# Patient Record
Sex: Female | Born: 1955 | Race: Black or African American | Hispanic: No | Smoking: Former smoker
Health system: Southern US, Community
[De-identification: ages and names within clinical notes are randomized; demographics above are authoritative.]

## PROBLEM LIST (undated history)

## (undated) ENCOUNTER — Ambulatory Visit (HOSPITAL_COMMUNITY): Discharge status: Against Medical Advice | Payer: 59

## (undated) DIAGNOSIS — E079 Disorder of thyroid, unspecified: Secondary | ICD-10-CM

## (undated) DIAGNOSIS — T7840XA Allergy, unspecified, initial encounter: Secondary | ICD-10-CM

## (undated) DIAGNOSIS — K219 Gastro-esophageal reflux disease without esophagitis: Secondary | ICD-10-CM

## (undated) DIAGNOSIS — H5440 Blindness, one eye, unspecified eye: Secondary | ICD-10-CM

## (undated) DIAGNOSIS — M1990 Unspecified osteoarthritis, unspecified site: Secondary | ICD-10-CM

## (undated) DIAGNOSIS — I10 Essential (primary) hypertension: Secondary | ICD-10-CM

## (undated) DIAGNOSIS — M549 Dorsalgia, unspecified: Secondary | ICD-10-CM

## (undated) DIAGNOSIS — D649 Anemia, unspecified: Secondary | ICD-10-CM

## (undated) DIAGNOSIS — J80 Acute respiratory distress syndrome: Secondary | ICD-10-CM

## (undated) HISTORY — PX: OTHER SURGICAL HISTORY: SHX169

## (undated) HISTORY — DX: Allergy, unspecified, initial encounter: T78.40XA

## (undated) HISTORY — DX: Acute respiratory distress syndrome: J80

## (undated) HISTORY — DX: Unspecified osteoarthritis, unspecified site: M19.90

## (undated) HISTORY — PX: HERNIA REPAIR: SHX51

## (undated) HISTORY — DX: Blindness, one eye, unspecified eye: H54.40

## (undated) HISTORY — DX: Anemia, unspecified: D64.9

## (undated) HISTORY — DX: Gastro-esophageal reflux disease without esophagitis: K21.9

---

## 1999-05-13 ENCOUNTER — Encounter: Payer: Self-pay | Admitting: Emergency Medicine

## 1999-05-13 ENCOUNTER — Emergency Department (HOSPITAL_COMMUNITY): Admission: EM | Admit: 1999-05-13 | Discharge: 1999-05-13 | Payer: Self-pay | Admitting: Emergency Medicine

## 2001-01-25 ENCOUNTER — Emergency Department (HOSPITAL_COMMUNITY): Admission: EM | Admit: 2001-01-25 | Discharge: 2001-01-25 | Payer: Self-pay | Admitting: *Deleted

## 2001-08-28 ENCOUNTER — Emergency Department (HOSPITAL_COMMUNITY): Admission: EM | Admit: 2001-08-28 | Discharge: 2001-08-28 | Payer: Self-pay | Admitting: Emergency Medicine

## 2002-03-01 ENCOUNTER — Other Ambulatory Visit: Admission: RE | Admit: 2002-03-01 | Discharge: 2002-03-01 | Payer: Self-pay | Admitting: Obstetrics & Gynecology

## 2002-03-22 ENCOUNTER — Ambulatory Visit (HOSPITAL_COMMUNITY): Admission: RE | Admit: 2002-03-22 | Discharge: 2002-03-22 | Payer: Self-pay | Admitting: Obstetrics & Gynecology

## 2002-03-22 ENCOUNTER — Encounter: Payer: Self-pay | Admitting: Obstetrics & Gynecology

## 2004-01-16 ENCOUNTER — Encounter (HOSPITAL_COMMUNITY): Admission: RE | Admit: 2004-01-16 | Discharge: 2004-04-15 | Payer: Self-pay | Admitting: Internal Medicine

## 2004-06-29 ENCOUNTER — Ambulatory Visit (HOSPITAL_COMMUNITY): Admission: RE | Admit: 2004-06-29 | Discharge: 2004-06-29 | Payer: Self-pay | Admitting: Internal Medicine

## 2004-09-09 ENCOUNTER — Ambulatory Visit: Payer: Self-pay | Admitting: Psychiatry

## 2004-09-11 ENCOUNTER — Inpatient Hospital Stay (HOSPITAL_COMMUNITY): Admission: EM | Admit: 2004-09-11 | Discharge: 2004-09-17 | Payer: Self-pay | Admitting: Psychiatry

## 2004-09-25 ENCOUNTER — Emergency Department (HOSPITAL_COMMUNITY): Admission: EM | Admit: 2004-09-25 | Discharge: 2004-09-25 | Payer: Self-pay | Admitting: Family Medicine

## 2004-10-30 ENCOUNTER — Emergency Department (HOSPITAL_COMMUNITY): Admission: EM | Admit: 2004-10-30 | Discharge: 2004-10-30 | Payer: Self-pay | Admitting: Emergency Medicine

## 2004-11-07 ENCOUNTER — Emergency Department (HOSPITAL_COMMUNITY): Admission: EM | Admit: 2004-11-07 | Discharge: 2004-11-07 | Payer: Self-pay | Admitting: Emergency Medicine

## 2006-03-29 ENCOUNTER — Emergency Department (HOSPITAL_COMMUNITY): Admission: EM | Admit: 2006-03-29 | Discharge: 2006-03-29 | Payer: Self-pay | Admitting: Emergency Medicine

## 2008-04-27 ENCOUNTER — Inpatient Hospital Stay (HOSPITAL_COMMUNITY): Admission: RE | Admit: 2008-04-27 | Discharge: 2008-05-02 | Payer: Self-pay | Admitting: *Deleted

## 2008-04-27 ENCOUNTER — Ambulatory Visit: Payer: Self-pay | Admitting: *Deleted

## 2008-04-27 ENCOUNTER — Other Ambulatory Visit: Payer: Self-pay | Admitting: Emergency Medicine

## 2008-04-27 ENCOUNTER — Emergency Department (HOSPITAL_COMMUNITY): Admission: EM | Admit: 2008-04-27 | Discharge: 2008-04-27 | Payer: Self-pay | Admitting: Emergency Medicine

## 2008-11-21 ENCOUNTER — Emergency Department (HOSPITAL_COMMUNITY): Admission: EM | Admit: 2008-11-21 | Discharge: 2008-11-21 | Payer: Self-pay | Admitting: Emergency Medicine

## 2009-05-15 ENCOUNTER — Emergency Department (HOSPITAL_COMMUNITY): Admission: EM | Admit: 2009-05-15 | Discharge: 2009-05-15 | Payer: Self-pay | Admitting: Emergency Medicine

## 2009-05-21 ENCOUNTER — Emergency Department (HOSPITAL_COMMUNITY): Admission: EM | Admit: 2009-05-21 | Discharge: 2009-05-21 | Payer: Self-pay | Admitting: Emergency Medicine

## 2009-06-05 ENCOUNTER — Ambulatory Visit (HOSPITAL_COMMUNITY): Admission: RE | Admit: 2009-06-05 | Discharge: 2009-06-06 | Payer: Self-pay | Admitting: General Surgery

## 2009-10-01 ENCOUNTER — Ambulatory Visit (HOSPITAL_BASED_OUTPATIENT_CLINIC_OR_DEPARTMENT_OTHER): Admission: RE | Admit: 2009-10-01 | Discharge: 2009-10-01 | Payer: Self-pay | Admitting: Orthopedic Surgery

## 2010-09-14 LAB — URINALYSIS, ROUTINE W REFLEX MICROSCOPIC
Glucose, UA: NEGATIVE mg/dL
Ketones, ur: NEGATIVE mg/dL
Leukocytes, UA: NEGATIVE
Protein, ur: 30 mg/dL — AB

## 2010-09-14 LAB — BASIC METABOLIC PANEL
BUN: 11 mg/dL (ref 6–23)
Calcium: 9.9 mg/dL (ref 8.4–10.5)
Creatinine, Ser: 0.92 mg/dL (ref 0.4–1.2)
GFR calc non Af Amer: >60 mL/min (ref >60)

## 2010-09-14 LAB — URINE MICROSCOPIC-ADD ON

## 2010-09-14 LAB — CBC
HCT: 37.9 % (ref 36.0–46.0)
Platelets: 214 10*3/uL (ref 150–400)
RDW: 14.1 % (ref 11.5–15.5)

## 2010-09-14 LAB — DIFFERENTIAL
Basophils Absolute: 0 10*3/uL (ref 0.0–0.1)
Eosinophils Relative: 2 % (ref 0–5)
Lymphocytes Relative: 26 % (ref 12–46)
Neutro Abs: 4.6 10*3/uL (ref 1.7–7.7)

## 2010-09-14 LAB — PROTIME-INR: Prothrombin Time: 13.5 seconds (ref 11.6–15.2)

## 2010-09-15 LAB — DIFFERENTIAL
Basophils Relative: 1 % (ref 0–1)
Eosinophils Absolute: 0.1 10*3/uL (ref 0.0–0.7)
Lymphs Abs: 2.3 10*3/uL (ref 0.7–4.0)
Monocytes Absolute: 0.4 10*3/uL (ref 0.1–1.0)
Monocytes Relative: 5 % (ref 3–12)
Neutro Abs: 5.5 10*3/uL (ref 1.7–7.7)

## 2010-09-15 LAB — COMPREHENSIVE METABOLIC PANEL
AST: 21 U/L (ref 0–37)
Albumin: 4.7 g/dL (ref 3.5–5.2)
Alkaline Phosphatase: 76 U/L (ref 39–117)
Chloride: 103 mEq/L (ref 96–112)
Potassium: 3.8 mEq/L (ref 3.5–5.1)
Total Bilirubin: 0.6 mg/dL (ref 0.3–1.2)
Total Protein: 8.5 g/dL — ABNORMAL HIGH (ref 6.0–8.3)

## 2010-09-15 LAB — URINE MICROSCOPIC-ADD ON

## 2010-09-15 LAB — URINALYSIS, ROUTINE W REFLEX MICROSCOPIC
Leukocytes, UA: NEGATIVE
Nitrite: NEGATIVE
Specific Gravity, Urine: 1.029 (ref 1.005–1.030)
Urobilinogen, UA: 0.2 mg/dL (ref 0.0–1.0)

## 2010-09-15 LAB — CBC
Platelets: 244 10*3/uL (ref 150–400)
RDW: 14.4 % (ref 11.5–15.5)

## 2010-09-21 LAB — GC/CHLAMYDIA PROBE AMP, GENITAL

## 2010-09-21 LAB — WET PREP, GENITAL
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
## 2010-10-30 NOTE — Discharge Summary (Signed)
NAME:  Jamie Hicks, Jamie Hicks NO.:  [id number]   MEDICAL RECORD NO.:  [id number]          PATIENT TYPE:  IPS   LOCATION:                                FACILITY:  [facility]   PHYSICIAN:  Jeanice Lim, M.D. DATE OF BIRTH:  03/23/1956   DATE OF ADMISSION:  DATE OF DISCHARGE:                                 DISCHARGE SUMMARY   IDENTIFYING DATA:  This is a 55 year old single African-American female with  depression, history of possible schizophrenia, homicidal ideation toward  children's father, drinking, and crack use.   MEDICATIONS:  Aciphex and propylthiouracil, noncompliant.   No known drug allergies.   PHYSICAL EXAMINATION:  NEUROLOGIC:  Within normal limits.  __________  within normal limits.  MENTAL STATUS EXAM:  Significant for poor judgment and insight, suicidal  ideation, depressive symptoms and anxiety.   ADMISSION DIAGNOSES:   AXIS I:  1.  Depressive disorder, not otherwise specified.  2.  Polysubstance abuse.  3.  Cocaine dependence.  4.  Alcohol dependence.  5.  Rule out substance-induced mood disorder.   AXIS II:  Deferred.   AXIS III:  1.  Acid reflux.  2.  Hyperthyroidism history.  3.  Medication noncompliance.   AXIS IV:  Stressors:  Occupational problems, moderate stressors.   AXIS V:  30/60.   The patient was admitted with routine p.r.n. medications and further  monitoring.  Was encouraged to participate in individual, group and milieu  therapy with stabilizing medications, monitored for safety.  Reported a  positive response to clinical intervention and was discharged after  medication education on Symmetrel, Protonix, propylthiouracil to take as  previously directed, Lexapro 5 mg daily, Bactrim, Risperdal 0.5 mg one every  9 p.m., trazodone 50 mg every 9 p.m., and Ambien 10 mg q.h.s. p.r.n.  The  patient was to follow up with [facility] walk-in clinic Monday through Friday, 1-4.   DISCHARGE DIAGNOSES:   AXIS I:  1.  Depressive disorder, not  otherwise specified.  2.  Polysubstance abuse.  3.  Cocaine dependence.  4.  Alcohol dependence.  5.  Rule out substance-induced mood disorder.   AXIS II:  Deferred.   AXIS III:  1.  Acid reflux.  2.  Hyperthyroidism history.  3.  Medication noncompliance.   AXIS IV:  Stressors:  Occupational problems, moderate stressors.   AXIS V:  55.      JEM/MEDQ  D:  10/22/2004  T:  10/22/2004  Job:  [id number]

## 2011-01-02 ENCOUNTER — Emergency Department (HOSPITAL_COMMUNITY)
Admission: EM | Admit: 2011-01-02 | Discharge: 2011-01-02 | Disposition: A | Discharge status: Home / Self Care | Payer: Self-pay | Attending: Emergency Medicine | Admitting: Emergency Medicine

## 2011-01-02 ENCOUNTER — Emergency Department (HOSPITAL_COMMUNITY): Payer: Self-pay

## 2011-01-02 DIAGNOSIS — W19XXXA Unspecified fall, initial encounter: Secondary | ICD-10-CM | POA: Insufficient documentation

## 2011-01-02 DIAGNOSIS — S99929A Unspecified injury of unspecified foot, initial encounter: Secondary | ICD-10-CM | POA: Insufficient documentation

## 2011-01-02 DIAGNOSIS — S8990XA Unspecified injury of unspecified lower leg, initial encounter: Secondary | ICD-10-CM | POA: Insufficient documentation

## 2011-01-02 DIAGNOSIS — F17200 Nicotine dependence, unspecified, uncomplicated: Secondary | ICD-10-CM | POA: Insufficient documentation

## 2011-01-02 DIAGNOSIS — M25569 Pain in unspecified knee: Secondary | ICD-10-CM | POA: Insufficient documentation

## 2011-03-16 LAB — T3, FREE: T3, Free: 2.3 (ref 2.3–4.2)

## 2011-03-17 LAB — BASIC METABOLIC PANEL
CO2: 24
Calcium: 9.5
Creatinine, Ser: 0.98
GFR calc non Af Amer: 60 — ABNORMAL LOW
Sodium: 135

## 2011-03-17 LAB — RAPID URINE DRUG SCREEN, HOSP PERFORMED
Amphetamines: NOT DETECTED
Opiates: NOT DETECTED
Tetrahydrocannabinol: NOT DETECTED

## 2011-03-17 LAB — ETHANOL

## 2011-03-17 LAB — DIFFERENTIAL
Eosinophils Absolute: 0.1
Lymphocytes Relative: 18
Lymphs Abs: 1.8
Monocytes Relative: 4
Neutro Abs: 7.8 — ABNORMAL HIGH
Neutrophils Relative %: 77

## 2011-03-17 LAB — CBC
Platelets: 261
RBC: 4.54
WBC: 10

## 2011-03-17 LAB — TRICYCLICS SCREEN, URINE

## 2012-01-27 ENCOUNTER — Emergency Department (HOSPITAL_COMMUNITY): Payer: Medicaid Other

## 2012-01-27 ENCOUNTER — Encounter (HOSPITAL_COMMUNITY): Payer: Self-pay | Admitting: Emergency Medicine

## 2012-01-27 ENCOUNTER — Emergency Department (HOSPITAL_COMMUNITY)
Admission: EM | Admit: 2012-01-27 | Discharge: 2012-01-27 | Disposition: A | Discharge status: Home / Self Care | Payer: Medicaid Other | Attending: Emergency Medicine | Admitting: Emergency Medicine

## 2012-01-27 DIAGNOSIS — M542 Cervicalgia: Secondary | ICD-10-CM | POA: Insufficient documentation

## 2012-01-27 DIAGNOSIS — E079 Disorder of thyroid, unspecified: Secondary | ICD-10-CM | POA: Insufficient documentation

## 2012-01-27 DIAGNOSIS — M545 Low back pain, unspecified: Secondary | ICD-10-CM | POA: Insufficient documentation

## 2012-01-27 DIAGNOSIS — M25569 Pain in unspecified knee: Secondary | ICD-10-CM | POA: Insufficient documentation

## 2012-01-27 DIAGNOSIS — F17200 Nicotine dependence, unspecified, uncomplicated: Secondary | ICD-10-CM | POA: Insufficient documentation

## 2012-01-27 DIAGNOSIS — M25561 Pain in right knee: Secondary | ICD-10-CM

## 2012-01-27 HISTORY — DX: Dorsalgia, unspecified: M54.9

## 2012-01-27 HISTORY — DX: Disorder of thyroid, unspecified: E07.9

## 2012-01-27 MED ORDER — OXYCODONE-ACETAMINOPHEN 5-325 MG PO TABS
1.0000 | ORAL_TABLET | Freq: Once | ORAL | Status: AC
  Administered 2012-01-27: 1 via ORAL
  Filled 2012-01-27: qty 1

## 2012-01-27 MED ORDER — MELOXICAM 15 MG PO TABS: 7.5000 mg | ORAL_TABLET | Freq: Every day | ORAL | Status: DC

## 2012-01-27 MED ORDER — OXYCODONE-ACETAMINOPHEN 5-325 MG PO TABS: ORAL_TABLET | ORAL | Status: AC

## 2012-01-27 MED ORDER — KETOROLAC TROMETHAMINE 60 MG/2ML IM SOLN
60.0000 mg | Freq: Once | INTRAMUSCULAR | Status: AC
  Administered 2012-01-27: 60 mg via INTRAMUSCULAR
  Filled 2012-01-27: qty 2

## 2012-01-27 NOTE — ED Provider Notes (Signed)
Medical screening examination/treatment/procedure(s) were performed by non-physician practitioner and as supervising physician I was immediately available for consultation/collaboration.  Camela Wich T Harmoney Sienkiewicz, MD 01/27/12 2320 

## 2012-01-27 NOTE — ED Provider Notes (Signed)
History     CSN: [id number]  Arrival date & time 01/27/12  1346   First MD Initiated Contact with Patient 01/27/12 1530      Chief Complaint  Patient presents with  . Back Pain  . Leg Pain    (Consider location/radiation/quality/duration/timing/severity/associated sxs/prior treatment) HPI  56 y.o. female in no acute distress complaining of worsening cervical and lower back pain over the course of the last year. Denies any recent trauma. Denies any radiation pain is 8/10 not relieved with over-the-counter NSAIDs. Exacerbated by weightbearing. She is also having issues with her right knee. Pain is severe at 8/10, nonradiating, exacerbated by weightbearing. And not relieved with over-the-counter pain medication. She had arthroscopy on the left knee but does not currently follow with her orthopedist Dr. Luiz Blare because she is uninsured at the moment. She denies any fever, cancer, history of IV drug use, numbness or paresthesia, or change in her bowel or bladder habits.   Past Medical History  Diagnosis Date  . Back pain   . Thyroid disease     Past Surgical History  Procedure Date  . Hernia repair   . Knee arthorscopy     No family history on file.  History  Substance Use Topics  . Smoking status: Current Some Day Smoker    Types: Cigarettes  . Smokeless tobacco: Not on file  . Alcohol Use: Yes     occassionally    OB History    Grav Para Term Preterm Abortions TAB SAB Ect Mult Living                  Review of Systems  Musculoskeletal: Positive for back pain and joint swelling.  All other systems reviewed and are negative.    Allergies  Review of patient's allergies indicates no known allergies.  Home Medications   Current Outpatient Rx  Name Route Sig Dispense Refill  . ACETAMINOPHEN ER 650 MG PO TBCR Oral Take 650 mg by mouth every 8 (eight) hours as needed. For pain      BP 160/86  Pulse 70  Temp 98.2 F (36.8 C) (Oral)  Resp 20  SpO2  98%  Physical Exam  Nursing note and vitals reviewed. Constitutional: She is oriented to person, place, and time. She appears well-developed and well-nourished. No distress.  HENT:  Head: Normocephalic.  Eyes: Conjunctivae and EOM are normal. Pupils are equal, round, and reactive to light.  Neck: Normal range of motion.       No midline tenderness or muscle spasms. Mildly tender to paracervical musculature  Cardiovascular: Normal rate.   Pulmonary/Chest: Effort normal.  Abdominal: Soft. Bowel sounds are normal.  Musculoskeletal: Normal range of motion.       Right knee: No deformity, erythema or abrasions. FROM. Mild effusion with no ballotable knee cap she also has crepitance worse in the right than the left.. Anterior and posterior drawer show no abnormal laxity. Stable to valgus and varus stress. Joint lines are non-tender. Neurovascularly intact. Pt ambulates with mildly antalgic gait.   Diffuse tenderness to bilateral lumbar paraspinal musculature. No point tenderness or midline tenderness.   Pulses 2+ x4 extremities and sensation is intact to both soft touch and pinprick x4 extremities.  Neurological: She is alert and oriented to person, place, and time.  Psychiatric: She has a normal mood and affect.    ED Course  Procedures (including critical care time)  Labs Reviewed - No data to display Dg Cervical Spine Complete  01/27/2012  *  RADIOLOGY REPORT*  Clinical Data: Neck pain.  Remote history of fall.  CERVICAL SPINE - COMPLETE 4+ VIEW  Comparison: None.  Findings: The cervical spine is visualized through the midbody C7. There is disc space narrowing at C4-C5.  There is moderate bilateral neural foraminal narrowing, worse on the left at the C4-5 level. AP and odontoid views are unremarkable.  IMPRESSION: Spondylosis as described.  Original Report Authenticated By: Elsie Stain, M.D.     1. Lumbago without sciatica   2. Cervicalgia   3. Arthralgia of knee, right       MDM   Chronic and worsening low back and knee pain likely secondary to arthritis. I will give patient prescription for Mobic and Percocet for breakthrough pain. I will also encourage use of a neoprene compression sleeve to the right knee. Encourage patient to follow up with her orthopedist Dr. Luiz Blare. Pt verbalized understanding and agrees with care plan. Outpatient follow-up and return precautions given.           Wynetta Emery, PA-C 01/27/12 1555

## 2012-01-27 NOTE — ED Notes (Signed)
Pt presenting to ed with c/o back pain and bilateral leg pain. Pt states pain x 1 year pt states her doctor referred her for an mri but she never had it. Pt states now her pain is worse pt states she does have history of chronic back pain

## 2012-01-27 NOTE — ED Notes (Signed)
Pt states "here for some pain medication, need a doctor to tx my thyroid"; papers given to pt for primary MD resources.

## 2012-01-27 NOTE — ED Notes (Signed)
Pt verbalizes understanding 

## 2012-08-13 ENCOUNTER — Encounter (HOSPITAL_COMMUNITY): Payer: Self-pay | Admitting: *Deleted

## 2012-08-13 ENCOUNTER — Emergency Department (HOSPITAL_COMMUNITY)
Admission: EM | Admit: 2012-08-13 | Discharge: 2012-08-13 | Disposition: A | Discharge status: Home / Self Care | Payer: Medicare Other | Attending: Emergency Medicine | Admitting: Emergency Medicine

## 2012-08-13 DIAGNOSIS — Z79899 Other long term (current) drug therapy: Secondary | ICD-10-CM | POA: Diagnosis not present

## 2012-08-13 DIAGNOSIS — M545 Low back pain, unspecified: Secondary | ICD-10-CM | POA: Insufficient documentation

## 2012-08-13 DIAGNOSIS — Z862 Personal history of diseases of the blood and blood-forming organs and certain disorders involving the immune mechanism: Secondary | ICD-10-CM | POA: Insufficient documentation

## 2012-08-13 DIAGNOSIS — F17200 Nicotine dependence, unspecified, uncomplicated: Secondary | ICD-10-CM | POA: Insufficient documentation

## 2012-08-13 DIAGNOSIS — Z8639 Personal history of other endocrine, nutritional and metabolic disease: Secondary | ICD-10-CM | POA: Insufficient documentation

## 2012-08-13 DIAGNOSIS — Z791 Long term (current) use of non-steroidal anti-inflammatories (NSAID): Secondary | ICD-10-CM | POA: Diagnosis not present

## 2012-08-13 DIAGNOSIS — M2550 Pain in unspecified joint: Secondary | ICD-10-CM | POA: Diagnosis not present

## 2012-08-13 MED ORDER — HYDROCODONE-ACETAMINOPHEN 5-325 MG PO TABS: ORAL_TABLET | ORAL | Status: DC

## 2012-08-13 MED ORDER — NAPROXEN 500 MG PO TABS: 500.0000 mg | ORAL_TABLET | Freq: Two times a day (BID) | ORAL | Status: DC

## 2012-08-13 NOTE — ED Notes (Signed)
Pt states has a hx of R knee pain and lower back pain and for the past 2 months has been hurting her, states hard to get up and down stairs.

## 2012-08-13 NOTE — ED Provider Notes (Signed)
History     CSN: [id number]  Arrival date & time 08/13/12  1155   First MD Initiated Contact with Patient 08/13/12 1310      Chief Complaint  Patient presents with  . Knee Pain  . Back Pain    (Consider location/radiation/quality/duration/timing/severity/associated sxs/prior treatment) HPI Comments: Patient with h/o back pain and bilateral knee pain for > 3 years. Worse right knee and lower back pain over past 2 months. Ibuprofen with minimal relief. No red flag s/s of lower back pain. The onset of this condition was acute. The course is constant. Aggravating factors: walking. Alleviating factors: none.     Patient is a 57 y.o. female presenting with knee pain and back pain. The history is provided by the patient.  Knee Pain Associated symptoms: back pain   Associated symptoms: no neck pain   Back Pain Associated symptoms: no numbness and no weakness     Past Medical History  Diagnosis Date  . Back pain   . Thyroid disease     Past Surgical History  Procedure Laterality Date  . Hernia repair    . Knee arthorscopy      No family history on file.  History  Substance Use Topics  . Smoking status: Current Some Day Smoker    Types: Cigarettes  . Smokeless tobacco: Never Used  . Alcohol Use: Yes     Comment: occassionally    OB History   Grav Para Term Preterm Abortions TAB SAB Ect Mult Living                  Review of Systems  Constitutional: Negative for activity change.  HENT: Negative for neck pain.   Musculoskeletal: Positive for back pain and arthralgias. Negative for joint swelling.  Skin: Negative for wound.  Neurological: Negative for weakness and numbness.    Allergies  Latex  Home Medications   Current Outpatient Rx  Name  Route  Sig  Dispense  Refill  . acetaminophen (ARTHRITIS PAIN RELIEF) 650 MG CR tablet   Oral   Take 650 mg by mouth every 8 (eight) hours as needed. For pain         . Multiple Vitamin (MULTIVITAMIN WITH MINERALS)  TABS   Oral   Take 1 tablet by mouth daily.         Marland Kitchen HYDROcodone-acetaminophen (NORCO/VICODIN) 5-325 MG per tablet      Take 1-2 tablets every 6 hours as needed for severe pain   12 tablet   0   . naproxen (NAPROSYN) 500 MG tablet   Oral   Take 1 tablet (500 mg total) by mouth 2 (two) times daily.   20 tablet   0     BP 142/80  Pulse 72  Temp(Src) 98 F (36.7 C) (Oral)  Resp 18  SpO2 99%  Physical Exam  Nursing note and vitals reviewed. Constitutional: She appears well-developed and well-nourished.  HENT:  Head: Normocephalic and atraumatic.  Eyes: Pupils are equal, round, and reactive to light.  Neck: Normal range of motion. Neck supple.  Cardiovascular: Exam reveals no decreased pulses.   Musculoskeletal: She exhibits tenderness. She exhibits no edema.       Right hip: Normal.       Left hip: Normal.       Right knee: She exhibits normal range of motion, no swelling and no effusion. Tenderness found. Medial joint line and lateral joint line tenderness noted.       Right ankle: Normal.  Thoracic back: Normal.       Lumbar back: She exhibits tenderness. She exhibits normal range of motion and no bony tenderness.  Neurological: She is alert. No sensory deficit.  Motor, sensation, and vascular distal to the injury is fully intact.   Skin: Skin is warm and dry.  Psychiatric: She has a normal mood and affect.    ED Course  Procedures (including critical care time)  Labs Reviewed - No data to display No results found.   1. Knee pain, right   2. Low back pain     1:18 PM Patient seen and examined. Pt driving home.    Vital signs reviewed and are as follows: Filed Vitals:   08/13/12 1214  BP: 142/80  Pulse: 72  Temp: 98 F (36.7 C)  Resp: 18   1:18 PM Patient counseled on use of narcotic pain medications. Counseled not to combine these medications with others containing tylenol. Urged not to drink alcohol, drive, or perform any other activities  that requires focus while taking these medications. The patient verbalizes understanding and agrees with the plan.  1:18 PM Patient was counseled on RICE protocol and told to rest injury, use ice for no longer than 15 minutes every hour, compress the area, and elevate above the level of their heart as much as possible to reduce swelling.  Questions answered.  Patient verbalized understanding.       MDM  Knee pain, chronic low back pain. No concern for septic arthritis or blood clot. No compartment syndrome. Conservative management indicated.         [place], [place] 08/15/12 [id number]

## 2012-08-15 NOTE — ED Provider Notes (Signed)
Medical screening examination/treatment/procedure(s) were performed by non-physician practitioner and as supervising physician I was immediately available for consultation/collaboration.  John M Bednar, MD 08/15/12 1329 

## 2012-09-06 DIAGNOSIS — Z79899 Other long term (current) drug therapy: Secondary | ICD-10-CM | POA: Diagnosis not present

## 2012-09-22 ENCOUNTER — Encounter (HOSPITAL_COMMUNITY): Payer: Self-pay

## 2012-09-22 ENCOUNTER — Emergency Department (INDEPENDENT_AMBULATORY_CARE_PROVIDER_SITE_OTHER)
Admission: EM | Admit: 2012-09-22 | Discharge: 2012-09-22 | Disposition: A | Discharge status: Home / Self Care | Payer: Medicare Other | Source: Home / Self Care | Attending: Family Medicine | Admitting: Family Medicine

## 2012-09-22 DIAGNOSIS — M25562 Pain in left knee: Secondary | ICD-10-CM | POA: Diagnosis present

## 2012-09-22 DIAGNOSIS — M25569 Pain in unspecified knee: Secondary | ICD-10-CM

## 2012-09-22 DIAGNOSIS — I10 Essential (primary) hypertension: Secondary | ICD-10-CM | POA: Diagnosis present

## 2012-09-22 DIAGNOSIS — F17200 Nicotine dependence, unspecified, uncomplicated: Secondary | ICD-10-CM | POA: Diagnosis present

## 2012-09-22 DIAGNOSIS — G8929 Other chronic pain: Secondary | ICD-10-CM

## 2012-09-22 DIAGNOSIS — E039 Hypothyroidism, unspecified: Secondary | ICD-10-CM | POA: Diagnosis present

## 2012-09-22 LAB — COMPREHENSIVE METABOLIC PANEL
ALT: 17 U/L (ref 0–35)
AST: 21 U/L (ref 0–37)
Alkaline Phosphatase: 71 U/L (ref 39–117)
CO2: 28 mEq/L (ref 19–32)
Chloride: 103 mEq/L (ref 96–112)
GFR calc Af Amer: 77 mL/min — ABNORMAL LOW (ref >90)
GFR calc non Af Amer: 66 mL/min — ABNORMAL LOW (ref >90)
Glucose, Bld: 101 mg/dL — ABNORMAL HIGH (ref 70–99)
Potassium: 4.2 mEq/L (ref 3.5–5.1)
Total Bilirubin: 0.3 mg/dL (ref 0.3–1.2)

## 2012-09-22 LAB — HEMOGLOBIN A1C
Hgb A1c MFr Bld: 5.9 % — ABNORMAL HIGH (ref <5.7)
Mean Plasma Glucose: 123 mg/dL — ABNORMAL HIGH (ref <117)

## 2012-09-22 LAB — TSH: TSH: 39.002 u[IU]/mL — ABNORMAL HIGH (ref 0.350–4.500)

## 2012-09-22 LAB — CBC
HCT: 36.3 % (ref 36.0–46.0)
MCH: 27.1 pg (ref 26.0–34.0)
MCHC: 33.3 g/dL (ref 30.0–36.0)
MCV: 81.4 fL (ref 78.0–100.0)
RDW: 14.6 % (ref 11.5–15.5)

## 2012-09-22 LAB — LIPID PANEL: LDL Cholesterol: 144 mg/dL — ABNORMAL HIGH (ref 0–99)

## 2012-09-22 LAB — T4, FREE: Free T4: 0.42 ng/dL — ABNORMAL LOW (ref 0.80–1.80)

## 2012-09-22 MED ORDER — TRAMADOL HCL 50 MG PO TABS: 50.0000 mg | ORAL_TABLET | Freq: Three times a day (TID) | ORAL | Status: DC | PRN

## 2012-09-22 NOTE — ED Notes (Signed)
Patient states has chronic pain in both knees-more in the right Back pain for months Used to see DR Jodi Geralds

## 2012-09-22 NOTE — ED Provider Notes (Signed)
History     CSN: [id number]  Arrival date & time 09/22/12  1218   First MD Initiated Contact with Patient 09/22/12 1313      Chief Complaint  Patient presents with  . Back Pain    HPI  Pt reports that she has not had a PCP in a very long time.  Pt says that she has been going to an orthopedist Dr. Luiz Blare and reports that she hasn't been able to see him because of not having medical insurance.  Pt is reporting that she isn't taking any meds.    Past Medical History  Diagnosis Date  . Back pain   . Thyroid disease        Hypothyroidism      Plantar fasciitis  Past Surgical History  Procedure Laterality Date  . Hernia repair    . Knee arthorscopy      Family History  hypertension  History  Substance Use Topics  . Smoking status: Current Some Day Smoker    Types: Cigarettes  . Smokeless tobacco: Never Used  . Alcohol Use: Yes     Comment: occassionally    OB History   Grav Para Term Preterm Abortions TAB SAB Ect Mult Living                  Review of Systems Constitutional: Negative.  HENT: Negative.  Respiratory: Negative.  Cardiovascular: Negative.  Gastrointestinal: Negative.  Endocrine: Negative.  Genitourinary: Negative.  Musculoskeletal: chronic knee and back pain   Skin: Negative.  Allergic/Immunologic: Negative.  Neurological: Negative.  Hematological: Negative.  Psychiatric/Behavioral: Negative.  All other systems reviewed and are negative   Allergies  Latex  Home Medications   Current Outpatient Rx  Name  Route  Sig  Dispense  Refill  . acetaminophen (ARTHRITIS PAIN RELIEF) 650 MG CR tablet   Oral   Take 650 mg by mouth every 8 (eight) hours as needed. For pain         . HYDROcodone-acetaminophen (NORCO/VICODIN) 5-325 MG per tablet      Take 1-2 tablets every 6 hours as needed for severe pain   12 tablet   0   . Multiple Vitamin (MULTIVITAMIN WITH MINERALS) TABS   Oral   Take 1 tablet by mouth daily.         . naproxen  (NAPROSYN) 500 MG tablet   Oral   Take 1 tablet (500 mg total) by mouth 2 (two) times daily.   20 tablet   0     BP 149/79  Pulse 80  Temp(Src) 97.6 F (36.4 C) (Oral)  Resp 18  SpO2 100%  Physical Exam Nursing note and vitals reviewed.  Constitutional: She is oriented to person, place, and time. She appears well-developed and well-nourished. No distress.  HENT:  Head: Normocephalic and atraumatic.  Eyes: Conjunctivae and EOM are normal. Pupils are equal, round, and reactive to light.  Neck: Normal range of motion. Neck supple. No JVD present. No tracheal deviation present. No thyromegaly present.  Cardiovascular: Normal rate, regular rhythm and normal heart sounds.  Pulmonary/Chest: Effort normal and breath sounds normal. No respiratory distress. She has no wheezes.  Abdominal: Soft. Bowel sounds are normal.  Musculoskeletal: Normal range of motion. She exhibits no edema and no tenderness.  Lymphadenopathy:  She has no cervical adenopathy.  Neurological: She is alert and oriented to person, place, and time. She has normal reflexes.  Skin: Skin is warm and dry.  Psychiatric: She has a normal mood  and affect. Her behavior is normal. Judgment and thought content normal.    ED Course  Procedures (including critical care time)  Labs Reviewed - No data to display No results found.   No diagnosis found.  MDM  IMPRESSION  Hypothyroidism  Elevated BP  Chronic knee pain   Plantar fasciitis  Tobacco  Health maintenance  RECOMMENDATIONS / PLAN Check labs  Send for screening colonoscopy Send for appointment with Dr Margaretmary Bayley Send for appointment with sports medicine center for evaluation of knee problems   The patient was counseled on the dangers of tobacco use, and was advised to quit.  Reviewed strategies to maximize success, including removing cigarettes and smoking materials from environment and stress management.  FOLLOW UP  3 months for recheck   The  patient was given clear instructions to go to ER or return to medical center if symptoms don't improve, worsen or new problems develop.  The patient verbalized understanding.  The patient was told to call to get lab results if they haven't heard anything in the next week.             Cleora Fleet, MD 09/22/12 1529

## 2012-09-23 LAB — VITAMIN D 25 HYDROXY (VIT D DEFICIENCY, FRACTURES): Vit D, 25-Hydroxy: 24 ng/mL — ABNORMAL LOW (ref 30–89)

## 2012-09-24 ENCOUNTER — Encounter: Payer: Self-pay | Admitting: Family Medicine

## 2012-09-24 DIAGNOSIS — R7303 Prediabetes: Secondary | ICD-10-CM | POA: Insufficient documentation

## 2012-09-24 DIAGNOSIS — E785 Hyperlipidemia, unspecified: Secondary | ICD-10-CM | POA: Insufficient documentation

## 2012-09-24 DIAGNOSIS — E559 Vitamin D deficiency, unspecified: Secondary | ICD-10-CM | POA: Insufficient documentation

## 2012-09-24 DIAGNOSIS — M1990 Unspecified osteoarthritis, unspecified site: Secondary | ICD-10-CM | POA: Insufficient documentation

## 2012-09-24 NOTE — Progress Notes (Signed)
Quick Note:  Please inform patient that her TSH came back really high and her T4 came back low suggesting that she is not taking enough thyroid hormone. Please ask patient why she isn't taking her thyroid medicine anymore? Please call in new prescription of levothyroxine 50 mcg, take 1 po daily, #30, RF x 2. Follow up with Dr. Margaretmary Bayley (endocrinologist) as we had discussed in the office.   Please inform patient that her labs suggest that she has prediabetes. Recommend exercise program 5x per week and reduce sugar intake. Her vitamin D level came back low. Recommend that she take over the counter Vitamin D 1 cap po daily. Also her cholesterol levels were elevated. Recommend low fat low cholesterol diet and exercise as above. Labs should be checked again in 2 - 3 months.   Rodney Langton, MD, CDE, FAAFP [facility] [place], [place]   ______

## 2012-09-25 ENCOUNTER — Telehealth (HOSPITAL_COMMUNITY): Payer: Self-pay

## 2012-09-27 DIAGNOSIS — Z79899 Other long term (current) drug therapy: Secondary | ICD-10-CM | POA: Diagnosis not present

## 2012-09-29 ENCOUNTER — Telehealth (HOSPITAL_COMMUNITY): Payer: Self-pay

## 2012-09-29 NOTE — ED Notes (Signed)
Prescription for levothyroxine called into [facility] on [address]

## 2012-10-02 ENCOUNTER — Telehealth (HOSPITAL_COMMUNITY): Payer: Self-pay | Admitting: *Deleted

## 2012-10-02 NOTE — ED Notes (Addendum)
Call patient's home phone number to give lab report. Received message that number had been disconnected. Will try mobil number. Left message on mobil number to call back. I am not sure if it is the correct number for patient.

## 2012-10-20 DIAGNOSIS — F3112 Bipolar disorder, current episode manic without psychotic features, moderate: Secondary | ICD-10-CM | POA: Diagnosis not present

## 2012-12-05 DIAGNOSIS — Z79899 Other long term (current) drug therapy: Secondary | ICD-10-CM | POA: Diagnosis not present

## 2013-04-03 ENCOUNTER — Emergency Department (INDEPENDENT_AMBULATORY_CARE_PROVIDER_SITE_OTHER)
Admission: EM | Admit: 2013-04-03 | Discharge: 2013-04-03 | Disposition: A | Discharge status: Home / Self Care | Payer: Medicare Other | Source: Home / Self Care | Attending: Family Medicine | Admitting: Family Medicine

## 2013-04-03 ENCOUNTER — Encounter (HOSPITAL_COMMUNITY): Payer: Self-pay | Admitting: Emergency Medicine

## 2013-04-03 DIAGNOSIS — M549 Dorsalgia, unspecified: Secondary | ICD-10-CM | POA: Diagnosis not present

## 2013-04-03 DIAGNOSIS — M25561 Pain in right knee: Secondary | ICD-10-CM

## 2013-04-03 DIAGNOSIS — M25569 Pain in unspecified knee: Secondary | ICD-10-CM | POA: Diagnosis not present

## 2013-04-03 MED ORDER — MELOXICAM 7.5 MG PO TABS: 7.5000 mg | ORAL_TABLET | Freq: Every day | ORAL | Status: DC

## 2013-04-03 MED ORDER — HYDROCODONE-ACETAMINOPHEN 5-325 MG PO TABS: 2.0000 | ORAL_TABLET | ORAL | Status: DC | PRN

## 2013-04-03 NOTE — ED Notes (Signed)
PT   HAS  MULTIPLE   COMPLAINTS  HE  REPORTS  BODY  ACHES        /  COUGH  / BACK PAIN       SHE  WANTS  HER  THYRIOD  CHECKED  AS  WELL        PT  REPORTS     PAIN IS  CHRONIC

## 2013-04-03 NOTE — ED Provider Notes (Signed)
CSN: [id number]     Arrival date & time 04/03/13  4098 History   First MD Initiated Contact with Patient 04/03/13 1012     Chief Complaint  Patient presents with  . Cough   (Consider location/radiation/quality/duration/timing/severity/associated sxs/prior Treatment) Patient is a 57 y.o. female presenting with back pain. The history is provided by the patient. No language interpreter was used.  Back Pain Location:  Lumbar spine Quality:  Aching Radiates to:  Does not radiate Pain severity:  Moderate Pain is:  Same all the time Timing:  Constant Progression:  Worsening Chronicity:  New Relieved by:  Nothing Worsened by:  Nothing tried   Past Medical History  Diagnosis Date  . Back pain   . Thyroid disease    Past Surgical History  Procedure Laterality Date  . Hernia repair    . Knee arthorscopy     History reviewed. No pertinent family history. History  Substance Use Topics  . Smoking status: Current Some Day Smoker    Types: Cigarettes  . Smokeless tobacco: Never Used  . Alcohol Use: Yes     Comment: occassionally   OB History   Grav Para Term Preterm Abortions TAB SAB Ect Mult Living                 Review of Systems  Musculoskeletal: Positive for back pain.  All other systems reviewed and are negative.    Allergies  Latex  Home Medications   Current Outpatient Rx  Name  Route  Sig  Dispense  Refill  . THYROID PO   Oral   Take by mouth.         Marland Kitchen HYDROcodone-acetaminophen (NORCO/VICODIN) 5-325 MG per tablet   Oral   Take 2 tablets by mouth every 4 (four) hours as needed for pain.   10 tablet   0   . meloxicam (MOBIC) 7.5 MG tablet   Oral   Take 1 tablet (7.5 mg total) by mouth daily.   30 tablet   0   . traMADol (ULTRAM) 50 MG tablet   Oral   Take 1 tablet (50 mg total) by mouth every 8 (eight) hours as needed for pain.   30 tablet   0    There were no vitals taken for this visit. Physical Exam  Nursing note and vitals  reviewed. Constitutional: She is oriented to person, place, and time. She appears well-developed and well-nourished.  HENT:  Head: Normocephalic.  Right Ear: External ear normal.  Left Ear: External ear normal.  Nose: Nose normal.  Mouth/Throat: Oropharynx is clear and moist.  Eyes: Conjunctivae and EOM are normal. Pupils are equal, round, and reactive to light.  Neck: Normal range of motion. Neck supple.  Cardiovascular: Normal rate.   Pulmonary/Chest: Effort normal.  Abdominal: Soft.  Musculoskeletal: Normal range of motion.  Neurological: She is alert and oriented to person, place, and time. She has normal reflexes.  Skin: Skin is warm.  Psychiatric: She has a normal mood and affect.    ED Course  Procedures (including critical care time) Labs Review Labs Reviewed - No data to display Imaging Review No results found.  EKG Interpretation     Ventricular Rate:    PR Interval:    QRS Duration:   QT Interval:    QTC Calculation:   R Axis:     Text Interpretation:              MDM   1. Knee pain, bilateral  2. Back pain    Pt advised to call wellness clinic to be seen.  Pt referred to Dr. Luiz Blare to be seen    Elson Areas, PA-C 04/03/13 1047

## 2013-04-05 NOTE — ED Provider Notes (Signed)
Medical screening examination/treatment/procedure(s) were performed by a resident physician or non-physician practitioner and as the supervising physician I was immediately available for consultation/collaboration.  Clementeen Graham, MD    Rodolph Bong, MD 04/05/13 [phone]

## 2013-04-09 ENCOUNTER — Emergency Department (INDEPENDENT_AMBULATORY_CARE_PROVIDER_SITE_OTHER)
Admission: EM | Admit: 2013-04-09 | Discharge: 2013-04-09 | Disposition: A | Discharge status: Home / Self Care | Payer: Medicare Other | Source: Home / Self Care | Attending: Emergency Medicine | Admitting: Emergency Medicine

## 2013-04-09 ENCOUNTER — Encounter (HOSPITAL_COMMUNITY): Payer: Self-pay | Admitting: Emergency Medicine

## 2013-04-09 DIAGNOSIS — J040 Acute laryngitis: Secondary | ICD-10-CM

## 2013-04-09 MED ORDER — BENZONATATE 200 MG PO CAPS: 200.0000 mg | ORAL_CAPSULE | Freq: Three times a day (TID) | ORAL | Status: DC | PRN

## 2013-04-09 MED ORDER — PREDNISONE 20 MG PO TABS: 20.0000 mg | ORAL_TABLET | Freq: Two times a day (BID) | ORAL | Status: DC

## 2013-04-09 NOTE — ED Provider Notes (Signed)
Chief Complaint:   Chief Complaint  Patient presents with  . Sore Throat    History of Present Illness:   Jamie Hicks is a 57 year old female who has had a three-day history of sore throat, hoarseness, sweats, nasal congestion, ear congestion, cough productive of clear to white sputum, and chest pain. She denies fever, chills, aches, headache, eye problems, chest tightness, wheezing, or GI problems. She has had no known sick exposures.  Review of Systems:  Other than noted above, the patient denies any of the following symptoms: Systemic:  No fevers, chills, sweats, weight loss or gain, fatigue, or tiredness. Eye:  No redness or discharge. ENT:  No ear pain, drainage, headache, nasal congestion, drainage, sinus pressure, difficulty swallowing, or sore throat. Neck:  No neck pain or swollen glands. Lungs:  No cough, sputum production, hemoptysis, wheezing, chest tightness, shortness of breath or chest pain. GI:  No abdominal pain, nausea, vomiting or diarrhea.  PMFSH:  Past medical history, family history, social history, meds, and allergies were reviewed. She has no medication allergies. She takes level thyroxine and Vicodin for back and knee problems and hypothyroidism.  Physical Exam:   Vital signs:  BP 171/103  Pulse 65  Temp(Src) 98.6 F (37 C) (Oral)  Resp 20  SpO2 99% General:  Alert and oriented.  In no distress.  Skin warm and dry. Eye:  No conjunctival injection or drainage. Lids were normal. ENT:  TMs and canals were normal, without erythema or inflammation.  Nasal mucosa was clear and uncongested, without drainage.  Mucous membranes were moist.  Pharynx was clear with no exudate or drainage.  There were no oral ulcerations or lesions. Neck:  Supple, no adenopathy, tenderness or mass. Lungs:  No respiratory distress.  Lungs were clear to auscultation, without wheezes, rales or rhonchi.  Breath sounds were clear and equal bilaterally.  Heart:  Regular rhythm, without  gallops, murmers or rubs. Skin:  Clear, warm, and dry, without rash or lesions.  Labs:   Results for orders placed during the hospital encounter of 04/09/13  POCT RAPID STREP A ([facility] CARE ONLY)      Result Value Range   Streptococcus, Group A Screen (Direct) NEGATIVE  NEGATIVE    Assessment:  The encounter diagnosis was Laryngitis.  No indication for antibiotics.  Plan:   1.  Meds:  The following meds were prescribed:   New Prescriptions   BENZONATATE (TESSALON) 200 MG CAPSULE    Take 1 capsule (200 mg total) by mouth 3 (three) times daily as needed for cough.   PREDNISONE (DELTASONE) 20 MG TABLET    Take 1 tablet (20 mg total) by mouth 2 (two) times daily.    2.  Patient Education/Counseling:  The patient was given appropriate handouts, self care instructions, and instructed in symptomatic relief.    3.  Follow up:  The patient was told to follow up if no better in 7 days, if becoming worse in any way, and given some red flag symptoms such as fever or difficulty breathing which would prompt immediate return.  Follow up here if necessary.      Reuben Likes, MD 04/09/13 [phone]

## 2013-04-09 NOTE — ED Notes (Signed)
Patient not in treatment room 

## 2013-04-09 NOTE — ED Notes (Signed)
Sore throat for 3 days, treating self with cough drops

## 2013-04-11 LAB — CULTURE, GROUP A STREP

## 2013-04-11 NOTE — ED Notes (Signed)
Results of group A culture negative

## 2013-08-01 DIAGNOSIS — Z5181 Encounter for therapeutic drug level monitoring: Secondary | ICD-10-CM | POA: Diagnosis not present

## 2013-08-10 DIAGNOSIS — Z5181 Encounter for therapeutic drug level monitoring: Secondary | ICD-10-CM | POA: Diagnosis not present

## 2013-08-14 DIAGNOSIS — Z5181 Encounter for therapeutic drug level monitoring: Secondary | ICD-10-CM | POA: Diagnosis not present

## 2013-09-12 ENCOUNTER — Encounter (HOSPITAL_COMMUNITY): Payer: Self-pay | Admitting: Emergency Medicine

## 2013-09-12 ENCOUNTER — Emergency Department (INDEPENDENT_AMBULATORY_CARE_PROVIDER_SITE_OTHER)
Admission: EM | Admit: 2013-09-12 | Discharge: 2013-09-12 | Disposition: A | Discharge status: Home / Self Care | Payer: Medicare Other | Source: Home / Self Care | Attending: Family Medicine | Admitting: Family Medicine

## 2013-09-12 DIAGNOSIS — E663 Overweight: Secondary | ICD-10-CM | POA: Diagnosis not present

## 2013-09-12 DIAGNOSIS — J309 Allergic rhinitis, unspecified: Secondary | ICD-10-CM

## 2013-09-12 DIAGNOSIS — I10 Essential (primary) hypertension: Secondary | ICD-10-CM

## 2013-09-12 MED ORDER — IPRATROPIUM BROMIDE 0.06 % NA SOLN: 2.0000 | Freq: Four times a day (QID) | NASAL | Status: DC

## 2013-09-12 MED ORDER — AMLODIPINE BESYLATE 5 MG PO TABS: 5.0000 mg | ORAL_TABLET | Freq: Every day | ORAL | Status: DC

## 2013-09-12 MED ORDER — LORATADINE 10 MG PO TABS: 10.0000 mg | ORAL_TABLET | Freq: Every day | ORAL | Status: DC

## 2013-09-12 NOTE — ED Provider Notes (Signed)
CSN: [id number]     Arrival date & time 09/12/13  7829 History   First MD Initiated Contact with Patient 09/12/13 1031     Chief Complaint  Patient presents with  . URI   (Consider location/radiation/quality/duration/timing/severity/associated sxs/prior Treatment) HPI Comments: Patient presents with 3 days of nasal congestion, sneezing, clear rhinorrhea, watery eyes, ear fullness, occasional dizziness. States nasal congestion make it difficult to breath, but she is not short of breath.  Also states she needs to locate a new PCP so that she may return to taking her levothyroxine and "catch up" with regard to her routine health maintenance.   The history is provided by the patient.    Past Medical History  Diagnosis Date  . Back pain   . Thyroid disease    Past Surgical History  Procedure Laterality Date  . Hernia repair    . Knee arthorscopy     History reviewed. No pertinent family history. History  Substance Use Topics  . Smoking status: Current Some Day Smoker    Types: Cigarettes  . Smokeless tobacco: Never Used  . Alcohol Use: Yes     Comment: occassionally   OB History   Grav Para Term Preterm Abortions TAB SAB Ect Mult Living                 Review of Systems  Constitutional: Negative.   HENT: Positive for congestion, postnasal drip, rhinorrhea, sinus pressure and sneezing. Negative for sore throat.   Eyes: Positive for discharge. Negative for photophobia, pain, redness and visual disturbance.       Watery eyes  Respiratory: Positive for cough. Negative for choking, chest tightness, shortness of breath, wheezing and stridor.   Cardiovascular: Negative.   Gastrointestinal: Negative.   Genitourinary: Negative.   Musculoskeletal: Negative.   Skin: Negative.   Neurological: Positive for dizziness. Negative for tremors, seizures, syncope, speech difficulty, weakness, light-headedness, numbness and headaches.  Psychiatric/Behavioral: Negative.     Allergies   Latex  Home Medications   Current Outpatient Rx  Name  Route  Sig  Dispense  Refill  . amLODipine (NORVASC) 5 MG tablet   Oral   Take 1 tablet (5 mg total) by mouth daily.   30 tablet   1   . benzonatate (TESSALON) 200 MG capsule   Oral   Take 1 capsule (200 mg total) by mouth 3 (three) times daily as needed for cough.   30 capsule   0   . HYDROcodone-acetaminophen (NORCO/VICODIN) 5-325 MG per tablet   Oral   Take 2 tablets by mouth every 4 (four) hours as needed for pain.   10 tablet   0   . ipratropium (ATROVENT) 0.06 % nasal spray   Each Nare   Place 2 sprays into both nostrils 4 (four) times daily.   15 mL   1   . loratadine (CLARITIN) 10 MG tablet   Oral   Take 1 tablet (10 mg total) by mouth daily.   30 tablet   1   . meloxicam (MOBIC) 7.5 MG tablet   Oral   Take 1 tablet (7.5 mg total) by mouth daily.   30 tablet   0   . predniSONE (DELTASONE) 20 MG tablet   Oral   Take 1 tablet (20 mg total) by mouth 2 (two) times daily.   10 tablet   0   . THYROID PO   Oral   Take by mouth.         Marland Kitchen  traMADol (ULTRAM) 50 MG tablet   Oral   Take 1 tablet (50 mg total) by mouth every 8 (eight) hours as needed for pain.   30 tablet   0    BP 167/92  Pulse 62  Temp(Src) 97.6 F (36.4 C) (Oral)  Resp 12  SpO2 100% Physical Exam  Nursing note and vitals reviewed. Constitutional: She is oriented to person, place, and time. She appears well-developed and well-nourished.  HENT:  Head: Normocephalic and atraumatic.  Right Ear: Hearing, tympanic membrane, external ear and ear canal normal.  Left Ear: Hearing, tympanic membrane, external ear and ear canal normal.  Nose: Mucosal edema and rhinorrhea present.  Mouth/Throat: Uvula is midline, oropharynx is clear and moist and mucous membranes are normal. No oral lesions. No trismus in the jaw.  Eyes: Conjunctivae are normal. Right eye exhibits no discharge. Left eye exhibits no discharge. No scleral icterus.   Neck: Normal range of motion. Neck supple. No thyromegaly present.  Cardiovascular: Normal rate, regular rhythm and normal heart sounds.   Pulmonary/Chest: Effort normal and breath sounds normal. No respiratory distress. She has no wheezes.  Musculoskeletal: Normal range of motion.  Lymphadenopathy:    She has no cervical adenopathy.  Neurological: She is alert and oriented to person, place, and time. No cranial nerve deficit. She exhibits normal muscle tone. Coordination normal.  Skin: Skin is warm and dry.  Psychiatric: She has a normal mood and affect. Her behavior is normal.    ED Course  Procedures (including critical care time) Labs Review Labs Reviewed - No data to display Imaging Review No results found.   MDM   1. Allergic rhinitis   2. Hypertension   3. Overweight   Atrovent nasal spray and loratadine for allergic rhinitis. Begin Norvasc for blood pressure. Provided contact information for Dr. Carlean Jews. Dewey's office so that she may establish for primary care.   [place], [place] 09/12/13 1121

## 2013-09-12 NOTE — Discharge Instructions (Signed)
Please begin using medications as prescribed for your nasal congestion and high blood pressure and contact Dr. Ival Bible office so that you may establish with her office for primary care.   Allergic Rhinitis Allergic rhinitis is when the mucous membranes in the nose respond to allergens. Allergens are particles in the air that cause your body to have an allergic reaction. This causes you to release allergic antibodies. Through a chain of events, these eventually cause you to release histamine into the blood stream. Although meant to protect the body, it is this release of histamine that causes your discomfort, such as frequent sneezing, congestion, and an itchy, runny nose.  CAUSES  Seasonal allergic rhinitis (hay fever) is caused by pollen allergens that may come from grasses, trees, and weeds. Year-round allergic rhinitis (perennial allergic rhinitis) is caused by allergens such as house dust mites, pet dander, and mold spores.  SYMPTOMS   Nasal stuffiness (congestion).  Itchy, runny nose with sneezing and tearing of the eyes. DIAGNOSIS  Your health care provider can help you determine the allergen or allergens that trigger your symptoms. If you and your health care provider are unable to determine the allergen, skin or blood testing may be used. TREATMENT  Allergic Rhinitis does not have a cure, but it can be controlled by:  Medicines and allergy shots (immunotherapy).  Avoiding the allergen. Hay fever may often be treated with antihistamines in pill or nasal spray forms. Antihistamines block the effects of histamine. There are over-the-counter medicines that may help with nasal congestion and swelling around the eyes. Check with your health care provider before taking or giving this medicine.  If avoiding the allergen or the medicine prescribed do not work, there are many new medicines your health care provider can prescribe. Stronger medicine may be used if initial measures are ineffective.  Desensitizing injections can be used if medicine and avoidance does not work. Desensitization is when a patient is given ongoing shots until the body becomes less sensitive to the allergen. Make sure you follow up with your health care provider if problems continue. HOME CARE INSTRUCTIONS It is not possible to completely avoid allergens, but you can reduce your symptoms by taking steps to limit your exposure to them. It helps to know exactly what you are allergic to so that you can avoid your specific triggers. SEEK MEDICAL CARE IF:   You have a fever.  You develop a cough that does not stop easily (persistent).  You have shortness of breath.  You start wheezing.  Symptoms interfere with normal daily activities. Document Released: 02/23/2001 Document Revised: 03/21/2013 Document Reviewed: 02/05/2013 [facility] Patient Information 2014 [place].

## 2013-09-12 NOTE — ED Notes (Signed)
Pt has multiple  Complaints   She  Reports  Symptoms  Of   congested  Cough  And stuffy  Nose         With  Some  dizzyness  As  Well  And  Knee  Pain  -    Pt  Also reports  Symptoms    Of being  Out  Of her  thyriod  meds  And  Non  Compliance  With  followup

## 2013-09-13 NOTE — ED Provider Notes (Signed)
Medical screening examination/treatment/procedure(s) were performed by a resident physician or non-physician practitioner and as the supervising physician I was immediately available for consultation/collaboration.  Kashawn Manzano, MD     Beldon Nowling S Maisyn Nouri, MD 09/13/13 1640 

## 2013-10-10 DIAGNOSIS — H2510 Age-related nuclear cataract, unspecified eye: Secondary | ICD-10-CM | POA: Diagnosis not present

## 2013-10-10 DIAGNOSIS — H2513 Age-related nuclear cataract, bilateral: Secondary | ICD-10-CM | POA: Insufficient documentation

## 2013-10-10 DIAGNOSIS — H5210 Myopia, unspecified eye: Secondary | ICD-10-CM | POA: Diagnosis not present

## 2013-10-10 DIAGNOSIS — Z8669 Personal history of other diseases of the nervous system and sense organs: Secondary | ICD-10-CM | POA: Diagnosis not present

## 2013-10-10 DIAGNOSIS — H5440 Blindness, one eye, unspecified eye: Secondary | ICD-10-CM | POA: Insufficient documentation

## 2013-10-10 DIAGNOSIS — H524 Presbyopia: Secondary | ICD-10-CM | POA: Diagnosis not present

## 2014-03-28 ENCOUNTER — Encounter (HOSPITAL_COMMUNITY): Payer: Self-pay | Admitting: Emergency Medicine

## 2014-03-28 ENCOUNTER — Emergency Department (HOSPITAL_COMMUNITY): Payer: Medicare Other

## 2014-03-28 ENCOUNTER — Emergency Department (HOSPITAL_COMMUNITY)
Admission: EM | Admit: 2014-03-28 | Discharge: 2014-03-28 | Disposition: A | Discharge status: Home / Self Care | Payer: Medicare Other | Attending: Emergency Medicine | Admitting: Emergency Medicine

## 2014-03-28 DIAGNOSIS — E039 Hypothyroidism, unspecified: Secondary | ICD-10-CM | POA: Diagnosis not present

## 2014-03-28 DIAGNOSIS — Z7952 Long term (current) use of systemic steroids: Secondary | ICD-10-CM | POA: Diagnosis not present

## 2014-03-28 DIAGNOSIS — Z91040 Latex allergy status: Secondary | ICD-10-CM | POA: Insufficient documentation

## 2014-03-28 DIAGNOSIS — Z79899 Other long term (current) drug therapy: Secondary | ICD-10-CM | POA: Diagnosis not present

## 2014-03-28 DIAGNOSIS — M545 Low back pain: Secondary | ICD-10-CM | POA: Diagnosis not present

## 2014-03-28 DIAGNOSIS — M1711 Unilateral primary osteoarthritis, right knee: Secondary | ICD-10-CM | POA: Diagnosis not present

## 2014-03-28 DIAGNOSIS — M25561 Pain in right knee: Secondary | ICD-10-CM

## 2014-03-28 DIAGNOSIS — Z720 Tobacco use: Secondary | ICD-10-CM | POA: Insufficient documentation

## 2014-03-28 DIAGNOSIS — R42 Dizziness and giddiness: Secondary | ICD-10-CM | POA: Diagnosis not present

## 2014-03-28 LAB — I-STAT CHEM 8, ED
BUN: 20 mg/dL (ref 6–23)
CALCIUM ION: 1.2 mmol/L (ref 1.12–1.23)
Chloride: 104 mEq/L (ref 96–112)
Creatinine, Ser: 1.1 mg/dL (ref 0.50–1.10)
Glucose, Bld: 95 mg/dL (ref 70–99)
HEMATOCRIT: 41 % (ref 36.0–46.0)
HEMOGLOBIN: 13.9 g/dL (ref 12.0–15.0)
Potassium: 4.5 mEq/L (ref 3.7–5.3)
Sodium: 139 mEq/L (ref 137–147)
TCO2: 27 mmol/L (ref 0–100)

## 2014-03-28 MED ORDER — HYDROCODONE-ACETAMINOPHEN 5-325 MG PO TABS
2.0000 | ORAL_TABLET | Freq: Once | ORAL | Status: AC
  Administered 2014-03-28: 2 via ORAL
  Filled 2014-03-28: qty 2

## 2014-03-28 MED ORDER — TRAMADOL HCL 50 MG PO TABS: 50.0000 mg | ORAL_TABLET | Freq: Three times a day (TID) | ORAL | Status: DC | PRN

## 2014-03-28 MED ORDER — MELOXICAM 7.5 MG PO TABS: 7.5000 mg | ORAL_TABLET | Freq: Every day | ORAL | Status: DC

## 2014-03-28 MED ORDER — LEVOTHYROXINE SODIUM 50 MCG PO TABS: 50.0000 ug | ORAL_TABLET | Freq: Every day | ORAL | Status: DC

## 2014-03-28 NOTE — ED Provider Notes (Signed)
CSN: [id number]     Arrival date & time 03/28/14  1624 History   None    Chief Complaint  Patient presents with  . Dizziness  . Back Pain  . Knee Pain     (Consider location/radiation/quality/duration/timing/severity/associated sxs/prior Treatment) Patient is a 58 y.o. female presenting with dizziness and knee pain. The history is provided by the patient. No language interpreter was used.  Dizziness Quality:  Lightheadedness Severity:  Moderate Onset quality:  Gradual Timing:  Constant Progression:  Worsening Chronicity:  Recurrent Context: physical activity   Relieved by:  Nothing Worsened by:  Nothing tried Ineffective treatments:  None tried Associated symptoms: no diarrhea   Knee Pain Location:  Knee Knee location:  R knee Pain details:    Quality:  Aching   Radiates to:  Does not radiate   Severity:  Moderate   Onset quality:  Gradual   Timing:  Constant Relieved by:  Nothing Worsened by:  Nothing tried Ineffective treatments:  None tried Risk factors: no concern for non-accidental trauma     Past Medical History  Diagnosis Date  . Back pain   . Thyroid disease    Past Surgical History  Procedure Laterality Date  . Hernia repair    . Knee arthorscopy     History reviewed. No pertinent family history. History  Substance Use Topics  . Smoking status: Current Some Day Smoker    Types: Cigarettes  . Smokeless tobacco: Never Used  . Alcohol Use: Yes     Comment: occassionally   OB History   Grav Para Term Preterm Abortions TAB SAB Ect Mult Living                 Review of Systems  Gastrointestinal: Negative for diarrhea.  Neurological: Positive for dizziness.  All other systems reviewed and are negative.     Allergies  Latex  Home Medications   Prior to Admission medications   Medication Sig Start Date End Date Taking? Authorizing Provider  amLODipine (NORVASC) 5 MG tablet Take 1 tablet (5 mg total) by mouth daily. 09/12/13   Audelia Hives Presson, PA  benzonatate (TESSALON) 200 MG capsule Take 1 capsule (200 mg total) by mouth 3 (three) times daily as needed for cough. 04/09/13   Harden Mo, MD  HYDROcodone-acetaminophen (NORCO/VICODIN) 5-325 MG per tablet Take 2 tablets by mouth every 4 (four) hours as needed for pain. 04/03/13   Fransico Meadow, PA-C  ipratropium (ATROVENT) 0.06 % nasal spray Place 2 sprays into both nostrils 4 (four) times daily. 09/12/13   Audelia Hives Presson, PA  loratadine (CLARITIN) 10 MG tablet Take 1 tablet (10 mg total) by mouth daily. 09/12/13   Audelia Hives Presson, PA  meloxicam (MOBIC) 7.5 MG tablet Take 1 tablet (7.5 mg total) by mouth daily. 04/03/13   Fransico Meadow, PA-C  predniSONE (DELTASONE) 20 MG tablet Take 1 tablet (20 mg total) by mouth 2 (two) times daily. 04/09/13   Harden Mo, MD  THYROID PO Take by mouth.    Historical Provider, MD  traMADol (ULTRAM) 50 MG tablet Take 1 tablet (50 mg total) by mouth every 8 (eight) hours as needed for pain. 09/22/12   Clanford L Johnson, MD   BP 183/92  Pulse 64  Temp(Src) 97.7 F (36.5 C) (Oral)  Resp 18  SpO2 97% Physical Exam  Nursing note and vitals reviewed. Constitutional: She appears well-developed and well-nourished.  HENT:  Head: Normocephalic.  Right Ear:  External ear normal.  Left Ear: External ear normal.  Nose: Nose normal.  Mouth/Throat: Oropharynx is clear and moist.  Eyes: Conjunctivae are normal. Pupils are equal, round, and reactive to light.  Neck: Normal range of motion.  Cardiovascular: Normal rate, regular rhythm and normal heart sounds.   Pulmonary/Chest: Effort normal and breath sounds normal.  Abdominal: Soft. Bowel sounds are normal.  Musculoskeletal: Normal range of motion.  Neurological: She is alert.  Skin: Skin is warm.  Psychiatric: She has a normal mood and affect.    ED Course  Procedures (including critical care time) Labs Review Labs Reviewed  I-STAT CHEM 8, ED    Imaging Review No  results found.   EKG Interpretation None     normal sinus 68  Normal ekg  MDM  Pt feels like her symptoms are coming from being without her synthroid.   Pt has not had for months.   Pt advised of osteosrthritis.  Pt request medication for arthritis.   Pt also would like to have an MRI of her back.  Pt was told she needed one 2 years ago.  (I advised pt to discuss with her primary MD)    Final diagnoses:  Knee pain, right  Hypothyroidism (acquired)    Tramadol meloxicam synthroid    Fransico Meadow, PA-C 03/29/14 0114

## 2014-03-28 NOTE — ED Notes (Signed)
Patient transported to X-ray 

## 2014-03-28 NOTE — ED Notes (Signed)
Pt c/o intermittent dizziness for past few days, significantly worse today. Pt states she her PCP told her she has hyperthyroidism. Pt states she has been falling backwards in the tub s/t loss of balance, denies head injuries during falls. Pt c/o low back pain and right knee pain, along with fluid in her right knee after knee arthroscopy.

## 2014-03-28 NOTE — Progress Notes (Signed)
  CARE MANAGEMENT ED NOTE 03/28/2014  Patient:  JOHNANNA, BAKKE   Account Number:  [id number]  Date Initiated:  03/28/2014  Documentation initiated by:  Livia Snellen  Subjective/Objective Assessment:   Patient presents to Ed with dizziness and balance issues.     Subjective/Objective Assessment Detail:     Action/Plan:   Action/Plan Detail:   Anticipated DC Date:  03/28/2014     Status Recommendation to Physician:   Result of Recommendation:    Other ED [place]  Other  PCP issues    Choice offered to / List presented to:            Status of service:  Completed, signed off  ED Comments:   ED Comments Detail:  EDCM spoke to patient at bedside.  Patient confirms she has Medicare insurnace without pcp.  [facility] provided patient with list of pcps who accept Medicare insurance within a ten mile radius of patient's zip code [phone].  Patient thankful for resources.  No further EDCM needs at this time.

## 2014-03-28 NOTE — Discharge Instructions (Signed)
Arthralgia °Your caregiver has diagnosed you as suffering from an arthralgia. Arthralgia means there is pain in a joint. This can come from many reasons including: °· Bruising the joint which causes soreness (inflammation) in the joint. °· Wear and tear on the joints which occur as we grow older (osteoarthritis). °· Overusing the joint. °· Various forms of arthritis. °· Infections of the joint. °Regardless of the cause of pain in your joint, most of these different pains respond to anti-inflammatory drugs and rest. The exception to this is when a joint is infected, and these cases are treated with antibiotics, if it is a bacterial infection. °HOME CARE INSTRUCTIONS  °· Rest the injured area for as long as directed by your caregiver. Then slowly start using the joint as directed by your caregiver and as the pain allows. Crutches as directed may be useful if the ankles, knees or hips are involved. If the knee was splinted or casted, continue use and care as directed. If an stretchy or elastic wrapping bandage has been applied today, it should be removed and re-applied every 3 to 4 hours. It should not be applied tightly, but firmly enough to keep swelling down. Watch toes and feet for swelling, bluish discoloration, coldness, numbness or excessive pain. If any of these problems (symptoms) occur, remove the ace bandage and re-apply more loosely. If these symptoms persist, contact your caregiver or return to this location. °· For the first 24 hours, keep the injured extremity elevated on pillows while lying down. °· Apply ice for 15-20 minutes to the sore joint every couple hours while awake for the first half day. Then 03-04 times per day for the first 48 hours. Put the ice in a plastic bag and place a towel between the bag of ice and your skin. °· Wear any splinting, casting, elastic bandage applications, or slings as instructed. °· Only take over-the-counter or prescription medicines for pain, discomfort, or fever as  directed by your caregiver. Do not use aspirin immediately after the injury unless instructed by your physician. Aspirin can cause increased bleeding and bruising of the tissues. °· If you were given crutches, continue to use them as instructed and do not resume weight bearing on the sore joint until instructed. °Persistent pain and inability to use the sore joint as directed for more than 2 to 3 days are warning signs indicating that you should see a caregiver for a follow-up visit as soon as possible. Initially, a hairline fracture (break in bone) may not be evident on X-rays. Persistent pain and swelling indicate that further evaluation, non-weight bearing or use of the joint (use of crutches or slings as instructed), or further X-rays are indicated. X-rays may sometimes not show a small fracture until a week or 10 days later. Make a follow-up appointment with your own caregiver or one to whom we have referred you. A radiologist (specialist in reading X-rays) may read your X-rays. Make sure you know how you are to obtain your X-ray results. Do not assume everything is normal if you do not hear from us. °SEEK MEDICAL CARE IF: °Bruising, swelling, or pain increases. °SEEK IMMEDIATE MEDICAL CARE IF:  °· Your fingers or toes are numb or blue. °· The pain is not responding to medications and continues to stay the same or get worse. °· The pain in your joint becomes severe. °· You develop a fever over 102° F (38.9° C). °· It becomes impossible to move or use the joint. °MAKE SURE YOU:  °·   Understand these instructions.  Will watch your condition.  Will get help right away if you are not doing well or get worse. Document Released: 05/31/2005 Document Revised: 08/23/2011 Document Reviewed: 01/17/2008 [facility] Patient Information 2015 [place], [place]. This information is not intended to replace advice given to you by your health care provider. Make sure you discuss any questions you have with your health care  provider. Hypothyroidism The thyroid is a large gland located in the lower front of your neck. The thyroid gland helps control metabolism. Metabolism is how your body handles food. It controls metabolism with the hormone thyroxine. When this gland is underactive (hypothyroid), it produces too little hormone.  CAUSES These include:   Absence or destruction of thyroid tissue.  Goiter due to iodine deficiency.  Goiter due to medications.  Congenital defects (since birth).  Problems with the pituitary. This causes a lack of TSH (thyroid stimulating hormone). This hormone tells the thyroid to turn out more hormone. SYMPTOMS  Lethargy (feeling as though you have no energy)  Cold intolerance  Weight gain (in spite of normal food intake)  Dry skin  Coarse hair  Menstrual irregularity (if severe, may lead to infertility)  Slowing of thought processes Cardiac problems are also caused by insufficient amounts of thyroid hormone. Hypothyroidism in the newborn is cretinism, and is an extreme form. It is important that this form be treated adequately and immediately or it will lead rapidly to retarded physical and mental development. DIAGNOSIS  To prove hypothyroidism, your caregiver may do blood tests and ultrasound tests. Sometimes the signs are hidden. It may be necessary for your caregiver to watch this illness with blood tests either before or after diagnosis and treatment. TREATMENT  Low levels of thyroid hormone are increased by using synthetic thyroid hormone. This is a safe, effective treatment. It usually takes about four weeks to gain the full effects of the medication. After you have the full effect of the medication, it will generally take another four weeks for problems to leave. Your caregiver may start you on low doses. If you have had heart problems the dose may be gradually increased. It is generally not an emergency to get rapidly to normal. HOME CARE INSTRUCTIONS   Take your  medications as your caregiver suggests. Let your caregiver know of any medications you are taking or start taking. Your caregiver will help you with dosage schedules.  As your condition improves, your dosage needs may increase. It will be necessary to have continuing blood tests as suggested by your caregiver.  Report all suspected medication side effects to your caregiver. SEEK MEDICAL CARE IF: Seek medical care if you develop:  Sweating.  Tremulousness (tremors).  Anxiety.  Rapid weight loss.  Heat intolerance.  Emotional swings.  Diarrhea.  Weakness. SEEK IMMEDIATE MEDICAL CARE IF:  You develop chest pain, an irregular heart beat (palpitations), or a rapid heart beat. MAKE SURE YOU:   Understand these instructions.  Will watch your condition.  Will get help right away if you are not doing well or get worse. Document Released: 05/31/2005 Document Revised: 08/23/2011 Document Reviewed: 01/19/2008 [facility] Patient Information 2015 [place], [place]. This information is not intended to replace advice given to you by your health care provider. Make sure you discuss any questions you have with your health care provider.

## 2014-03-29 LAB — TSH: TSH: 48.36 u[IU]/mL — AB (ref 0.350–4.500)

## 2014-03-31 NOTE — ED Provider Notes (Signed)
Medical screening examination/treatment/procedure(s) were performed by non-physician practitioner and as supervising physician I was immediately available for consultation/collaboration.   EKG Interpretation   Date/Time:  Thursday March 28 2014 16:46:12 EDT Ventricular Rate:  65 PR Interval:  178 QRS Duration: 100 QT Interval:  468 QTC Calculation: 486 R Axis:   16 Text Interpretation:  Normal sinus rhythm Prolonged QT Abnormal ECG ED  PHYSICIAN INTERPRETATION AVAILABLE IN [facility] Confirmed by TEST,  Record ([id number]) on 03/30/2014 10:54:28 AM       Jasper Riling. Alvino Chapel, [place] 03/31/14 [phone]

## 2014-07-19 ENCOUNTER — Emergency Department (EMERGENCY_DEPARTMENT_HOSPITAL)
Admission: EM | Admit: 2014-07-19 | Discharge: 2014-07-20 | Disposition: A | Discharge status: Home / Self Care | Payer: Medicare Other | Source: Home / Self Care | Attending: Emergency Medicine | Admitting: Emergency Medicine

## 2014-07-19 ENCOUNTER — Encounter (HOSPITAL_COMMUNITY): Payer: Self-pay | Admitting: Emergency Medicine

## 2014-07-19 DIAGNOSIS — Z7952 Long term (current) use of systemic steroids: Secondary | ICD-10-CM

## 2014-07-19 DIAGNOSIS — Z720 Tobacco use: Secondary | ICD-10-CM | POA: Insufficient documentation

## 2014-07-19 DIAGNOSIS — F1994 Other psychoactive substance use, unspecified with psychoactive substance-induced mood disorder: Secondary | ICD-10-CM

## 2014-07-19 DIAGNOSIS — R45850 Homicidal ideations: Secondary | ICD-10-CM

## 2014-07-19 DIAGNOSIS — Z79899 Other long term (current) drug therapy: Secondary | ICD-10-CM

## 2014-07-19 DIAGNOSIS — Z91040 Latex allergy status: Secondary | ICD-10-CM

## 2014-07-19 DIAGNOSIS — R45851 Suicidal ideations: Secondary | ICD-10-CM | POA: Insufficient documentation

## 2014-07-19 DIAGNOSIS — E079 Disorder of thyroid, unspecified: Secondary | ICD-10-CM | POA: Insufficient documentation

## 2014-07-19 DIAGNOSIS — M67432 Ganglion, left wrist: Secondary | ICD-10-CM

## 2014-07-19 DIAGNOSIS — L299 Pruritus, unspecified: Secondary | ICD-10-CM | POA: Insufficient documentation

## 2014-07-19 DIAGNOSIS — Z791 Long term (current) use of non-steroidal anti-inflammatories (NSAID): Secondary | ICD-10-CM

## 2014-07-19 DIAGNOSIS — Y900 Blood alcohol level of less than 20 mg/100 ml: Secondary | ICD-10-CM | POA: Diagnosis not present

## 2014-07-19 DIAGNOSIS — F332 Major depressive disorder, recurrent severe without psychotic features: Secondary | ICD-10-CM | POA: Diagnosis not present

## 2014-07-19 LAB — CBC WITH DIFFERENTIAL/PLATELET
Basophils Absolute: 0 10*3/uL (ref 0.0–0.1)
Basophils Relative: 0 % (ref 0–1)
EOS ABS: 0.2 10*3/uL (ref 0.0–0.7)
Eosinophils Relative: 2 % (ref 0–5)
HEMATOCRIT: 39.5 % (ref 36.0–46.0)
Hemoglobin: 13 g/dL (ref 12.0–15.0)
LYMPHS PCT: 20 % (ref 12–46)
Lymphs Abs: 1.9 10*3/uL (ref 0.7–4.0)
MCH: 28.7 pg (ref 26.0–34.0)
MCHC: 32.9 g/dL (ref 30.0–36.0)
MCV: 87.2 fL (ref 78.0–100.0)
Monocytes Absolute: 0.4 10*3/uL (ref 0.1–1.0)
Monocytes Relative: 4 % (ref 3–12)
NEUTROS PCT: 74 % (ref 43–77)
Neutro Abs: 7.2 10*3/uL (ref 1.7–7.7)
PLATELETS: 238 10*3/uL (ref 150–400)
RBC: 4.53 MIL/uL (ref 3.87–5.11)
RDW: 14.4 % (ref 11.5–15.5)
WBC: 9.7 10*3/uL (ref 4.0–10.5)

## 2014-07-19 LAB — BASIC METABOLIC PANEL
Anion gap: 9 (ref 5–15)
BUN: 16 mg/dL (ref 6–23)
CO2: 26 mmol/L (ref 19–32)
CREATININE: 1.12 mg/dL — AB (ref 0.50–1.10)
Calcium: 9.8 mg/dL (ref 8.4–10.5)
Chloride: 99 mmol/L (ref 96–112)
GFR, EST AFRICAN AMERICAN: 62 mL/min — AB (ref >90)
GFR, EST NON AFRICAN AMERICAN: 53 mL/min — AB (ref >90)
Glucose, Bld: 100 mg/dL — ABNORMAL HIGH (ref 70–99)
POTASSIUM: 3.6 mmol/L (ref 3.5–5.1)
Sodium: 134 mmol/L — ABNORMAL LOW (ref 135–145)

## 2014-07-19 LAB — ACETAMINOPHEN LEVEL

## 2014-07-19 LAB — SALICYLATE LEVEL: Salicylate Lvl: <4 mg/dL (ref 2.8–20.0)

## 2014-07-19 LAB — TSH: TSH: 40.5 u[IU]/mL — AB (ref 0.350–4.500)

## 2014-07-19 LAB — ETHANOL

## 2014-07-19 MED ORDER — GABAPENTIN 600 MG PO TABS: 600.0000 mg | ORAL_TABLET | Freq: Two times a day (BID) | ORAL | Status: DC

## 2014-07-19 MED ORDER — ZOLPIDEM TARTRATE 5 MG PO TABS
5.0000 mg | ORAL_TABLET | Freq: Every day | ORAL | Status: DC
  Administered 2014-07-19: 5 mg via ORAL
  Filled 2014-07-19: qty 1

## 2014-07-19 MED ORDER — HYDROXYZINE HCL 25 MG PO TABS: 50.0000 mg | ORAL_TABLET | Freq: Three times a day (TID) | ORAL | Status: DC | PRN

## 2014-07-19 MED ORDER — LEVOTHYROXINE SODIUM 50 MCG PO TABS
50.0000 ug | ORAL_TABLET | Freq: Every day | ORAL | Status: DC
  Administered 2014-07-20: 50 ug via ORAL
  Filled 2014-07-19 (×2): qty 1

## 2014-07-19 MED ORDER — AMLODIPINE BESYLATE 5 MG PO TABS
5.0000 mg | ORAL_TABLET | Freq: Every day | ORAL | Status: DC
  Administered 2014-07-19 – 2014-07-20 (×2): 5 mg via ORAL
  Filled 2014-07-19 (×2): qty 1

## 2014-07-19 MED ORDER — MELOXICAM 7.5 MG PO TABS: 7.5000 mg | ORAL_TABLET | Freq: Every day | ORAL | Status: DC

## 2014-07-19 MED ORDER — IPRATROPIUM BROMIDE 0.06 % NA SOLN: 2.0000 | Freq: Four times a day (QID) | NASAL | Status: DC

## 2014-07-19 MED ORDER — LEVOTHYROXINE SODIUM 50 MCG PO TABS
50.0000 ug | ORAL_TABLET | Freq: Every day | ORAL | Status: DC
Start: 2014-07-20 — End: 2014-07-19

## 2014-07-19 MED ORDER — LORATADINE 10 MG PO TABS
10.0000 mg | ORAL_TABLET | Freq: Every day | ORAL | Status: DC
  Administered 2014-07-19 – 2014-07-20 (×2): 10 mg via ORAL
  Filled 2014-07-19 (×2): qty 1

## 2014-07-19 MED ORDER — LORAZEPAM 1 MG PO TABS: 1.0000 mg | ORAL_TABLET | Freq: Four times a day (QID) | ORAL | Status: DC | PRN

## 2014-07-19 NOTE — ED Provider Notes (Signed)
Patient has vague thoughts of suicide. She has not been sleeping for days. She's been not taking any of her medications. She agrees to inpatient psychiatric hospitalization. Results for orders placed or performed during the hospital encounter of 07/19/14  CBC with Differential/Platelet  Result Value Ref Range   WBC 9.7 4.0 - 10.5 K/uL   RBC 4.53 3.87 - 5.11 MIL/uL   Hemoglobin 13.0 12.0 - 15.0 g/dL   HCT 39.5 36.0 - 46.0 %   MCV 87.2 78.0 - 100.0 fL   MCH 28.7 26.0 - 34.0 pg   MCHC 32.9 30.0 - 36.0 g/dL   RDW 14.4 11.5 - 15.5 %   Platelets 238 150 - 400 K/uL   Neutrophils Relative % 74 43 - 77 %   Neutro Abs 7.2 1.7 - 7.7 K/uL   Lymphocytes Relative 20 12 - 46 %   Lymphs Abs 1.9 0.7 - 4.0 K/uL   Monocytes Relative 4 3 - 12 %   Monocytes Absolute 0.4 0.1 - 1.0 K/uL   Eosinophils Relative 2 0 - 5 %   Eosinophils Absolute 0.2 0.0 - 0.7 K/uL   Basophils Relative 0 0 - 1 %   Basophils Absolute 0.0 0.0 - 0.1 K/uL   No results found.   Orlie Dakin, MD 07/19/14 [phone]

## 2014-07-19 NOTE — Progress Notes (Signed)
EDCM called [facility] on [address] to inquire about patient's medication list.  EDCM spoke to Antony Haste at [facility] who reports prescriptions have not been filled for this patient since April of 2015.  Those prescriptions were Norvasc 5mg , Atrovent nasal spray, and Claritin.  EDCM made [facility] NP Josephine aware.  No further EDCM needs at this time.

## 2014-07-19 NOTE — BH Assessment (Addendum)
Assessment Note  Jamie Hicks is an 59 y.o. female who was brought into emergency room by a social worker from the interactive resource center due to pt sharing suicidal thoughts at her appointment today. Pt appeared intoxicated upon assessment. She was labile in mood- very animated and innapropriately laughing then very serious and sad almost in tears and then drowsy to the point of falling asleep. Pt states that she had thoughts yesterday of suicide but does not endorse them now. However- the social worker that was there with her stated that she WAS endorsing suicidal ideations this morning to jump off a bridge. Pt reluctantly states that she has been using alcohol, cocaine and marijuana but did not want the social worker to see this so she wrote it down for the counselor. She states that she uses alcohol when she has the money (1-2 times a month), cocaine (2-3 times a week) and marijuana (1-2 times a week). She would not state the amount used. Pt stated that she did not want to be admitted into the hospital at this time but then stated tearfully she needs to get help. She says that she has a family history of suicide and her dad killed himself by a gunshot wound to the head about 8 years ago. She also states that she has a lot of stress right now with financial issues and needs to "pay her bills" before she comes here for inpatient. She was notified that if she is a danger to herself she may need to stay. Jamie Hicks will see pt to determine final disposition.   Axis I: Substance Induced Mood Disorder Axis II: Deferred Axis III:  Past Medical History  Diagnosis Date  . Back pain   . Thyroid disease    Axis IV: economic problems, housing problems, problems related to social environment and problems with primary support group Axis V: 31-40 impairment in reality testing  Past Medical History:  Past Medical History  Diagnosis Date  . Back pain   . Thyroid disease     Past Surgical History   Procedure Laterality Date  . Hernia repair    . Knee arthorscopy      Family History: History reviewed. No pertinent family history.  Social History:  reports that she has been smoking Cigarettes.  She has never used smokeless tobacco. She reports that she drinks alcohol. She reports that she does not use illicit drugs.  Additional Social History:  Alcohol / Drug Use History of alcohol / drug use?: Yes Longest period of sobriety (when/how long): UTA Negative Consequences of Use: Personal relationships, Financial Withdrawal Symptoms:  (may be currently intoxicated) Substance #1 Name of Substance 1: Alcohol  1 - Age of First Use: UTA 1 - Amount (size/oz): UTA 1 - Frequency: reported 1-2 times a month 1 - Duration: Binges 1 - Last Use / Amount: UTA Substance #2 Name of Substance 2: Marijuana 2 - Age of First Use: around 62 2 - Amount (size/oz): UTA 2 - Frequency: reported 1-2 times a week 2 - Duration: UTA 2 - Last Use / Amount: UTA Substance #3 Name of Substance 3: Cocaine 3 - Age of First Use: around 63  3 - Amount (size/oz): UTA 3 - Frequency: reported 2-3 times a week 3 - Duration: UTA  3 - Last Use / Amount: UTA   CIWA: CIWA-Ar BP: 143/79 mmHg Pulse Rate: 76 COWS:    Allergies:  Allergies  Allergen Reactions  . Latex Rash    Home Medications:  (  Not in a hospital admission)  OB/GYN Status:  No LMP recorded. Patient is postmenopausal.  General Assessment Data Location of Assessment: [facility] ED ACT Assessment: Yes Is this a Tele or Face-to-Face Assessment?: Face-to-Face Is this an Initial Assessment or a Re-assessment for this encounter?: Initial Assessment Living Arrangements: Children Can pt return to current living arrangement?: Yes Admission Status: Voluntary Is patient capable of signing voluntary admission?: Yes Transfer from: Home Referral Source: Self/Family/Friend     [place] Living Arrangements: Children Name of Psychiatrist:   (None- used to go to Dr. Loni Muse ) Name of Therapist:  (None)  Education Status Is patient currently in school?: No Highest grade of school patient has completed: [employer]  Risk to self with the past 6 months Suicidal Ideation: Yes-Currently Present Suicidal Intent: No Is patient at risk for suicide?: Yes Suicidal Plan?: No (passive plan to jump off bridge ) Access to Means: Yes Specify Access to Suicidal Means:  (She has medications she could od on) What has been your use of drugs/alcohol within the last 12 months?:  (cocaine, marijuana, and alcohol) Previous Attempts/Gestures: Yes How many times?: 1 Other Self Harm Risks: drug use Triggers for Past Attempts: Unpredictable Intentional Self Injurious Behavior: None Family Suicide History: Yes (Dad killed himself) Recent stressful life event(s): Loss (Comment), Financial Problems (lost 5 close people within the year) Persecutory voices/beliefs?: No Depression: Yes Depression Symptoms: Tearfulness, Fatigue, Feeling worthless/self pity Substance abuse history and/or treatment for substance abuse?: Yes (UTA if ever in substance abuse related treatment) Suicide prevention information given to non-admitted patients: Not applicable  Risk to Others within the past 6 months Homicidal Ideation: No Thoughts of Harm to Others: No Current Homicidal Intent: No Current Homicidal Plan: No Access to Homicidal Means: No Identified Victim: None  History of harm to others?: No Assessment of Violence: None Noted Violent Behavior Description: None Does patient have access to weapons?: No Criminal Charges Pending?: No Does patient have a court date: No  Psychosis Hallucinations: None noted Delusions: None noted  Mental Status Report Appear/Hygiene: Bizarre Eye Contact: Fair Motor Activity: Freedom of movement Speech: Tangential Level of Consciousness: Other (Comment) (alert one moment and drowsy the next) Mood: Labile Affect:  Labile Anxiety Level: Minimal Thought Processes: Tangential Judgement: Impaired Orientation: Person, Place Obsessive Compulsive Thoughts/Behaviors: Unable to Assess  Cognitive Functioning Concentration: Decreased Memory: Unable to Assess IQ: Average Insight: Poor Impulse Control: Poor Appetite: Good Weight Loss:  (0) Weight Gain: 30 Sleep: No Change Total Hours of Sleep:  (takes a lot of "mini naps") Vegetative Symptoms: None  ADLScreening [facility] Assessment Services) Patient's cognitive ability adequate to safely complete daily activities?: Yes Patient able to express need for assistance with ADLs?: Yes Independently performs ADLs?: Yes (appropriate for developmental age)  Prior Inpatient Therapy Prior Inpatient Therapy: Yes Prior Therapy Dates:  (years ago) Prior Therapy Facilty/Provider(s): [facility] Reason for Treatment: SI  Prior Outpatient Therapy Prior Outpatient Therapy: No (none known goes to [facility])  ADL Screening (condition at time of admission) Patient's cognitive ability adequate to safely complete daily activities?: Yes Is the patient deaf or have difficulty hearing?: No Does the patient have difficulty seeing, even when wearing glasses/contacts?: No Does the patient have difficulty concentrating, remembering, or making decisions?: No Patient able to express need for assistance with ADLs?: Yes Does the patient have difficulty dressing or bathing?: No Independently performs ADLs?: Yes (appropriate for developmental age) Does the patient have difficulty walking or climbing stairs?: Yes Weakness of Legs: Both Weakness  of Arms/Hands: Both  Home Assistive Devices/Equipment Home Assistive Devices/Equipment: None    Abuse/Neglect Assessment (Assessment to be complete while patient is alone) Physical Abuse: Denies Verbal Abuse: Denies Exploitation of patient/patient's resources: Denies Self-Neglect: Denies Values / Beliefs Cultural Requests During Hospitalization:  None Spiritual Requests During Hospitalization: None Consults Spiritual Care Consult Needed: No Advance Directives (For Healthcare) Does patient have an advance directive?: No Would patient like information on creating an advanced directive?: No - patient declined information    Additional Information 1:1 In Past 12 Months?: No CIRT Risk: No Elopement Risk: No Does patient have medical clearance?: Yes     Disposition:  Disposition Initial Assessment Completed for this Encounter: Yes Disposition of Patient: Inpatient treatment program Type of inpatient treatment program: Adult  On Site Evaluation by:   Reviewed with Physician:    Bedelia Person 07/19/2014 3:57 PM

## 2014-07-19 NOTE — ED Notes (Addendum)
Pt here for medical clearance for depression, suicidal ideation. Denies hallucinations, denies plan for suicide. Endorses ETOH and cocaine use. Pt c/o eye "allergy," swelling to left hand, chronic knee and back pain, blood pressure check.

## 2014-07-19 NOTE — ED Notes (Signed)
Pt sleeping at present, no distress noted, sonorous respirations noted.  Will continue to monitor for safety.

## 2014-07-19 NOTE — ED Provider Notes (Signed)
CSN: [id number]     Arrival date & time 07/19/14  1418 History  This chart was scribed for non-physician practitioner Lorre Munroe, PA-C working with Orlie Dakin, MD by Hilda Lias, ED Scribe. This patient was seen in room WTR4/WLPT4 and the patient's care was started at 2:47 PM.    Chief Complaint  Patient presents with  . Medical Clearance     The history is provided by the patient and a caregiver. No language interpreter was used.    HPI Comments: IMAJEAN MCDERMID is a 59 y.o. female who presents to the Emergency Department complaining of medical clearance for depression and suicidal ideation that has been present for the past few days. A caretaker accompanying her states that she mentioned ideas of jumping off of a bridge yesterday and this morning. Pt states that she has thought about overdosing on drugs and alcohol before as well. Pt states that she has reached her "breaking point" and states that she feels she needs to be admitted to a mental health facility for treatment. In addition pt c/o left wrist pain with associated swelling, chronic knee and back pain, and itchiness and watering from her eyes.     Past Medical History  Diagnosis Date  . Back pain   . Thyroid disease    Past Surgical History  Procedure Laterality Date  . Hernia repair    . Knee arthorscopy     History reviewed. No pertinent family history. History  Substance Use Topics  . Smoking status: Current Some Day Smoker    Types: Cigarettes  . Smokeless tobacco: Never Used  . Alcohol Use: Yes     Comment: occassionally   OB History    No data available     Review of Systems  Constitutional: Negative for fever and chills.  Eyes: Positive for itching.  Respiratory: Negative for shortness of breath.   Cardiovascular: Negative for chest pain.  Gastrointestinal: Negative for nausea, vomiting, diarrhea and constipation.  Genitourinary: Negative for dysuria.  Musculoskeletal: Positive for  arthralgias.  Psychiatric/Behavioral: Positive for suicidal ideas.      Allergies  Latex  Home Medications   Prior to Admission medications   Medication Sig Start Date End Date Taking? Authorizing Provider  amLODipine (NORVASC) 5 MG tablet Take 1 tablet (5 mg total) by mouth daily. 09/12/13   Audelia Hives Presson, PA  gabapentin (NEURONTIN) 600 MG tablet Take 600 mg by mouth 2 (two) times daily.    Historical Provider, MD  ipratropium (ATROVENT) 0.06 % nasal spray Place 2 sprays into both nostrils 4 (four) times daily. 09/12/13   Lutricia Feil, PA  levothyroxine (SYNTHROID, LEVOTHROID) 50 MCG tablet Take 1 tablet (50 mcg total) by mouth daily before breakfast. 03/28/14   Fransico Meadow, PA-C  loratadine (CLARITIN) 10 MG tablet Take 1 tablet (10 mg total) by mouth daily. 09/12/13   Audelia Hives Presson, PA  meloxicam (MOBIC) 7.5 MG tablet Take 1 tablet (7.5 mg total) by mouth daily. 03/28/14   Fransico Meadow, PA-C  predniSONE (DELTASONE) 20 MG tablet Take 1 tablet (20 mg total) by mouth 2 (two) times daily. 04/09/13   Harden Mo, MD  traMADol (ULTRAM) 50 MG tablet Take 1 tablet (50 mg total) by mouth every 8 (eight) hours as needed. 03/28/14   Fransico Meadow, PA-C   BP 143/79 mmHg  Pulse 76  Temp(Src) 97.7 F (36.5 C)  Resp 16  SpO2 98% Physical Exam  Constitutional: She is  oriented to person, place, and time. She appears well-developed and well-nourished.  HENT:  Head: Normocephalic and atraumatic.  No evidence of preseptal or orbital cellulitis  Eyes: Conjunctivae and EOM are normal. Pupils are equal, round, and reactive to light.  Watery eyes, no erythema, no foreign body  Neck: Normal range of motion. Neck supple.  Cardiovascular: Normal rate and regular rhythm.  Exam reveals no gallop and no friction rub.   No murmur heard. Pulmonary/Chest: Effort normal and breath sounds normal. No respiratory distress. She has no wheezes. She has no rales. She exhibits no  tenderness.  Abdominal: Soft. Bowel sounds are normal. She exhibits no distension and no mass. There is no tenderness. There is no rebound and no guarding.  Musculoskeletal: Normal range of motion. She exhibits no edema or tenderness.  Ganglion cyst to left wrist, no erythema, or sign of infection  Neurological: She is alert and oriented to person, place, and time.  Skin: Skin is warm and dry.  Psychiatric: She has a normal mood and affect. Her behavior is normal. Judgment and thought content normal.  Nursing note and vitals reviewed.   ED Course  Procedures (including critical care time)  DIAGNOSTIC STUDIES: Oxygen Saturation is 98% on RA, normal by my interpretation.    COORDINATION OF CARE: 2:55 PM Discussed treatment plan with pt at bedside and pt agreed to plan.   Labs Review Labs Reviewed - No data to display  Imaging Review No results found.   EKG Interpretation None      MDM   Final diagnoses:  Suicidal ideation    Patient with multiple complaints.  1 suicidal ideation, patient has had depression and SI. She denies any hallucinations. She states that she likes jump off bridge, or possibly overdose from alcohol and drugs. Will contact TTS for evaluation.  2. Ganglion cyst on wrist, recommend conservative therapy with ice and heat, primary care follow-up if symptoms worsen.  3. Itchy watery eyes times several days. Patient has had allergic conjunctivitis in the past. She has tried using drops with some relief. Will recommend Zyrtec.    Patient seen by and discussed with Dr. Winfred Leeds.  Patient is agreeable to staying for inpatient placement.  I personally performed the services described in this documentation, which was scribed in my presence. The recorded information has been reviewed and is accurate.    Montine Circle, PA-C 07/19/14 1711  Orlie Dakin, MD 07/19/14 1740

## 2014-07-19 NOTE — ED Notes (Signed)
Pt is a 59 year old voluntary admit who presents for ,"help," She stated she has been doing drugs on and off times 18 years and drinking wine. Pt also stated," I have a lot of problems and issues." She does have a supportive family and has grandchildren as well. Pt has a hx of thyroid disease, hernia repair and knee arthorscopy. She does have allergies to latex. Pt is very loud and manis on the unit. She was intially placed in bed 34 but moved as the pt across the hall knew this pt from crack houses. Blood was drawn on the pt for thyroid and a urine was obtained and sent to the lab. Pt does contract for safety and denies SI and HI.

## 2014-07-19 NOTE — ED Notes (Signed)
I stuck patient x2- unsuccessful x2. Phlebotomy notified.

## 2014-07-19 NOTE — Consult Note (Signed)
[facility] Consult   Reason for Consult:  Polysubstance abuse Referring Physician:  EDP Patient Identification: Jamie Hicks MRN:  [id number] Principal Diagnosis: Substance induced mood disorder Diagnosis:   Patient Active Problem List   Diagnosis Date Noted  . Substance induced mood disorder [F19.94] 07/19/2014  . Osteoarthritis [M19.90] 09/24/2012  . Prediabetes [R73.09] 09/24/2012  . Dyslipidemia [E78.5] 09/24/2012  . Vitamin D insufficiency [E55.9] 09/24/2012  . Hypothyroidism [E03.9] 09/22/2012  . Bilateral chronic knee pain [M25.561, M25.562, G89.29] 09/22/2012  . Smoker [Z72.0] 09/22/2012  . Elevated blood pressure [R03.0] 09/22/2012    Total Time spent with patient: 45 minutes  Subjective:   Jamie Hicks is a 59 y.o. female patient admitted with Substance induced mood disorder, Mood disorder, unspecified.Marland Kitchen  HPI: AA female, 59 years old was evaluated this evening for endorsing suicide and asking for assistance with Alcohol and drug use.  Patient stated that she need help with her thyroid problem, mental illness and multiple substance use.  Patient reports that she is not able to pay her bills because she uses her money for Marijuana, Alcohol and Cocaine.  She reported using Alcohol when she can afford it, uses Cocaine and Marijuana three or more days a week.  Patient could not describe quantity used.  Patient reported a previous diagnosis of Bipolar disorder long time ago and stated that she did not take  medications but was using "drugs" to self medicate.  She  Was hospitalized  in our [facility] back in 2009.  Patient reported that she has not been on any MH medications for years but she takes thyroid medication and blood pressure medications.  Patient does not see any outpatient Psychiatrist or therapist and stated that she has fired her PMD.  She reports mood swings and explosive anger at times.  She reports poor sleep or none at at in 3-4 days.  She  reports that her appetite is good.  Patient denies SI/HI/AVH but stated that she has been thinking of hurting the fathers of her 4 grown children.  She also reported that her father committed suicide few years ago by gun shot.   Patient look unkempt and disheveled.  We have accepted patient for admission and will be looking for inpatient Psychiatric bed.  We are waiting for her Alcohol, UDS  And TSH result to be made available.  Meanwhile we will be using Ativan on a prn basis for her detox and treatment of agitation.  Patient will be reevaluated in the morning when we have all of her lab results available.   HPI Elements:   Location:  Poly substance abuse, substance induced mood disorder, . Quality:  Insomnia, mood swings, poor anger management, irritability, . Severity:  severe. Timing:  Acute. Duration:  for almost 18 years. Context:  Seeking treatment for mood disorder, substance use.Marland Kitchen  Past Medical History:  Past Medical History  Diagnosis Date  . Back pain   . Thyroid disease     Past Surgical History  Procedure Laterality Date  . Hernia repair    . Knee arthorscopy     Family History: History reviewed. No pertinent family history. Social History:  History  Alcohol Use  . Yes    Comment: occassionally     History  Drug Use No    History   Social History  . Marital Status: Legally Separated    Spouse Name: N/A    Number of Children: N/A  . Years of Education: N/A   Social  History Main Topics  . Smoking status: Current Some Day Smoker    Types: Cigarettes  . Smokeless tobacco: Never Used  . Alcohol Use: Yes     Comment: occassionally  . Drug Use: No  . Sexual Activity: None   Other Topics Concern  . None   Social History Narrative   Additional Social History:    History of alcohol / drug use?: Yes Longest period of sobriety (when/how long): UTA Negative Consequences of Use: Personal relationships, Financial Withdrawal Symptoms:  (may be currently  intoxicated) Name of Substance 1: Alcohol  1 - Age of First Use: UTA 1 - Amount (size/oz): UTA 1 - Frequency: reported 1-2 times a month 1 - Duration: Binges 1 - Last Use / Amount: UTA Name of Substance 2: Marijuana 2 - Age of First Use: around 25 2 - Amount (size/oz): UTA 2 - Frequency: reported 1-2 times a week 2 - Duration: UTA 2 - Last Use / Amount: UTA Name of Substance 3: Cocaine 3 - Age of First Use: around 41  3 - Amount (size/oz): UTA 3 - Frequency: reported 2-3 times a week 3 - Duration: UTA  3 - Last Use / Amount: UTA                Allergies:   Allergies  Allergen Reactions  . Latex Rash    Vitals: Blood pressure 143/79, pulse 76, temperature 97.7 F (36.5 C), resp. rate 16, SpO2 98 %.  Risk to Self: Suicidal Ideation: Yes-Currently Present Suicidal Intent: No Is patient at risk for suicide?: Yes Suicidal Plan?: No (passive plan to jump off bridge ) Access to Means: Yes Specify Access to Suicidal Means:  (She has medications she could od on) What has been your use of drugs/alcohol within the last 12 months?:  (cocaine, marijuana, and alcohol) How many times?: 1 Other Self Harm Risks: drug use Triggers for Past Attempts: Unpredictable Intentional Self Injurious Behavior: None Risk to Others: Homicidal Ideation: No Thoughts of Harm to Others: No Current Homicidal Intent: No Current Homicidal Plan: No Access to Homicidal Means: No Identified Victim: None  History of harm to others?: No Assessment of Violence: None Noted Violent Behavior Description: None Does patient have access to weapons?: No Criminal Charges Pending?: No Does patient have a court date: No Prior Inpatient Therapy: Prior Inpatient Therapy: Yes Prior Therapy Dates:  (years ago) Prior Therapy Facilty/Provider(s): [facility] Reason for Treatment: SI Prior Outpatient Therapy: Prior Outpatient Therapy: No (none known goes to [facility])  Current Facility-Administered Medications  Medication  Dose Route Frequency Provider Last Rate Last Dose  . amLODipine (NORVASC) tablet 5 mg  5 mg Oral Daily Montine Circle, PA-C      . gabapentin (NEURONTIN) tablet 600 mg  600 mg Oral BID Montine Circle, PA-C      . ipratropium (ATROVENT) 0.06 % nasal spray 2 spray  2 spray Each Nare QID Montine Circle, PA-C      . Derrill Memo ON 07/20/2014] levothyroxine (SYNTHROID, LEVOTHROID) tablet 50 mcg  50 mcg Oral QAC breakfast Montine Circle, PA-C      . loratadine (CLARITIN) tablet 10 mg  10 mg Oral Daily Montine Circle, PA-C      . meloxicam (MOBIC) tablet 7.5 mg  7.5 mg Oral Daily Montine Circle, PA-C       Current Outpatient Prescriptions  Medication Sig Dispense Refill  . amLODipine (NORVASC) 5 MG tablet Take 1 tablet (5 mg total) by mouth daily. 30 tablet 1  . gabapentin (NEURONTIN)  600 MG tablet Take 600 mg by mouth 2 (two) times daily.    Marland Kitchen ipratropium (ATROVENT) 0.06 % nasal spray Place 2 sprays into both nostrils 4 (four) times daily. 15 mL 1  . levothyroxine (SYNTHROID, LEVOTHROID) 50 MCG tablet Take 1 tablet (50 mcg total) by mouth daily before breakfast. 30 tablet 1  . loratadine (CLARITIN) 10 MG tablet Take 1 tablet (10 mg total) by mouth daily. 30 tablet 1  . meloxicam (MOBIC) 7.5 MG tablet Take 1 tablet (7.5 mg total) by mouth daily. 30 tablet 0  . predniSONE (DELTASONE) 20 MG tablet Take 1 tablet (20 mg total) by mouth 2 (two) times daily. 10 tablet 0  . traMADol (ULTRAM) 50 MG tablet Take 1 tablet (50 mg total) by mouth every 8 (eight) hours as needed. 30 tablet 0    Musculoskeletal: Strength & Muscle Tone: within normal limits Gait & Station: normal Patient leans: N/A  Psychiatric Specialty Exam:     Blood pressure 143/79, pulse 76, temperature 97.7 F (36.5 C), resp. rate 16, SpO2 98 %.There is no height or weight on file to calculate BMI.  General Appearance: Casual and Disheveled  Eye Contact::  Good  Speech:  Clear and Coherent and Normal Rate  Volume:  Normal  Mood:   Anxious and Euphoric  Affect:  Labile and Full Range  Thought Process:  Loose and Tangential  Orientation:  Full (Time, Place, and Person)  Thought Content:  WDL  Suicidal Thoughts:  Yes.  without intent/plan  Homicidal Thoughts:  Yes.  without intent/plan  Memory:  Immediate;   Good Recent;   Good Remote;   Good  Judgement:  Impaired  Insight:  Shallow  Psychomotor Activity:  Normal  Concentration:  Poor  Recall:  NA  Fund of Knowledge:Poor  Language: Good  Akathisia:  NA  Handed:  Right  AIMS (if indicated):     Assets:  Desire for Improvement  ADL's:  Impaired  Cognition: WNL  Sleep:      Medical Decision Making: Established Problem, Worsening (2), Review of Medication Regimen & Side Effects (2) and Review of New Medication or Change in Dosage (2)  Treatment Plan Summary: Daily contact with patient to assess and evaluate symptoms and progress in treatment, Medication management and Plan Admit to inpatient Psychiatric unit.  Plan:  Recommend psychiatric Inpatient admission when medically cleared. Disposition: Admit to inpatient Psychiatric unit  Delfin Gant   PMHNP-BC  07/19/2014 5:28 PM

## 2014-07-19 NOTE — ED Notes (Signed)
TTS at bedside. 

## 2014-07-19 NOTE — ED Notes (Signed)
Delay in lab draw, csw talking to pt

## 2014-07-20 ENCOUNTER — Inpatient Hospital Stay (HOSPITAL_COMMUNITY)
Admission: AD | Admit: 2014-07-20 | Discharge: 2014-07-30 | DRG: 885 | Disposition: A | Discharge status: Home / Self Care | Payer: Medicare Other | Source: Intra-hospital | Attending: Psychiatry | Admitting: Psychiatry

## 2014-07-20 DIAGNOSIS — F1994 Other psychoactive substance use, unspecified with psychoactive substance-induced mood disorder: Secondary | ICD-10-CM | POA: Diagnosis present

## 2014-07-20 DIAGNOSIS — F17210 Nicotine dependence, cigarettes, uncomplicated: Secondary | ICD-10-CM | POA: Diagnosis present

## 2014-07-20 DIAGNOSIS — E039 Hypothyroidism, unspecified: Secondary | ICD-10-CM | POA: Diagnosis present

## 2014-07-20 DIAGNOSIS — M1990 Unspecified osteoarthritis, unspecified site: Secondary | ICD-10-CM | POA: Diagnosis present

## 2014-07-20 DIAGNOSIS — F419 Anxiety disorder, unspecified: Secondary | ICD-10-CM | POA: Diagnosis present

## 2014-07-20 DIAGNOSIS — E785 Hyperlipidemia, unspecified: Secondary | ICD-10-CM | POA: Diagnosis present

## 2014-07-20 DIAGNOSIS — R45851 Suicidal ideations: Secondary | ICD-10-CM | POA: Diagnosis present

## 2014-07-20 DIAGNOSIS — F1424 Cocaine dependence with cocaine-induced mood disorder: Secondary | ICD-10-CM | POA: Diagnosis present

## 2014-07-20 DIAGNOSIS — F332 Major depressive disorder, recurrent severe without psychotic features: Principal | ICD-10-CM | POA: Diagnosis present

## 2014-07-20 LAB — RAPID URINE DRUG SCREEN, HOSP PERFORMED
AMPHETAMINES: NOT DETECTED
BENZODIAZEPINES: NOT DETECTED
Barbiturates: NOT DETECTED
Cocaine: POSITIVE — AB
Opiates: NOT DETECTED
Tetrahydrocannabinol: POSITIVE — AB

## 2014-07-20 MED ORDER — LEVOTHYROXINE SODIUM 50 MCG PO TABS
50.0000 ug | ORAL_TABLET | Freq: Every day | ORAL | Status: DC
  Administered 2014-07-21 – 2014-07-30 (×10): 50 ug via ORAL
  Filled 2014-07-20 (×5): qty 1
  Filled 2014-07-20: qty 4
  Filled 2014-07-20: qty 1
  Filled 2014-07-20: qty 2
  Filled 2014-07-20 (×5): qty 1

## 2014-07-20 MED ORDER — LORAZEPAM 1 MG PO TABS
1.0000 mg | ORAL_TABLET | Freq: Four times a day (QID) | ORAL | Status: DC | PRN
  Administered 2014-07-20: 1 mg via ORAL
  Filled 2014-07-20 (×2): qty 1

## 2014-07-20 MED ORDER — MAGNESIUM HYDROXIDE 400 MG/5ML PO SUSP: 30.0000 mL | Freq: Every day | ORAL | Status: DC | PRN

## 2014-07-20 MED ORDER — ACETAMINOPHEN 325 MG PO TABS
650.0000 mg | ORAL_TABLET | Freq: Four times a day (QID) | ORAL | Status: DC | PRN
  Administered 2014-07-28: 650 mg via ORAL
  Filled 2014-07-20: qty 2

## 2014-07-20 MED ORDER — IBUPROFEN 200 MG PO TABS
600.0000 mg | ORAL_TABLET | Freq: Four times a day (QID) | ORAL | Status: DC | PRN
  Administered 2014-07-20 (×2): 600 mg via ORAL
  Filled 2014-07-20 (×2): qty 3

## 2014-07-20 MED ORDER — ZOLPIDEM TARTRATE 5 MG PO TABS
5.0000 mg | ORAL_TABLET | Freq: Every day | ORAL | Status: DC
  Administered 2014-07-20 – 2014-07-29 (×9): 5 mg via ORAL
  Filled 2014-07-20 (×10): qty 1

## 2014-07-20 MED ORDER — LORATADINE 10 MG PO TABS
10.0000 mg | ORAL_TABLET | Freq: Every day | ORAL | Status: DC
  Administered 2014-07-21 – 2014-07-30 (×10): 10 mg via ORAL
  Filled 2014-07-20 (×6): qty 1
  Filled 2014-07-20: qty 4
  Filled 2014-07-20 (×7): qty 1

## 2014-07-20 MED ORDER — HYDROXYZINE HCL 50 MG PO TABS
50.0000 mg | ORAL_TABLET | Freq: Three times a day (TID) | ORAL | Status: DC | PRN
  Administered 2014-07-28 – 2014-07-29 (×2): 50 mg via ORAL
  Filled 2014-07-20 (×2): qty 1
  Filled 2014-07-20: qty 10

## 2014-07-20 MED ORDER — ALUM & MAG HYDROXIDE-SIMETH 200-200-20 MG/5ML PO SUSP
30.0000 mL | ORAL | Status: DC | PRN
  Administered 2014-07-22 – 2014-07-25 (×3): 30 mL via ORAL
  Filled 2014-07-20 (×3): qty 30

## 2014-07-20 MED ORDER — AMLODIPINE BESYLATE 5 MG PO TABS
5.0000 mg | ORAL_TABLET | Freq: Every day | ORAL | Status: DC
  Administered 2014-07-21 – 2014-07-30 (×10): 5 mg via ORAL
  Filled 2014-07-20 (×7): qty 1
  Filled 2014-07-20: qty 4
  Filled 2014-07-20 (×6): qty 1

## 2014-07-20 MED ORDER — IBUPROFEN 600 MG PO TABS
600.0000 mg | ORAL_TABLET | Freq: Four times a day (QID) | ORAL | Status: DC | PRN
  Administered 2014-07-20 – 2014-07-29 (×9): 600 mg via ORAL
  Filled 2014-07-20 (×10): qty 1

## 2014-07-20 NOTE — Progress Notes (Signed)
Patient ID: Jamie Hicks, female   DOB: 1955-09-20, 59 y.o.   MRN: [id number] PER STATE REGULATIONS 482.30  THIS CHART WAS REVIEWED FOR MEDICAL NECESSITY WITH RESPECT TO THE PATIENT'S ADMISSION/DURATION OF STAY.  NEXT REVIEW DATE: 07/24/14  Roma Schanz, RN, BSN CASE MANAGER

## 2014-07-20 NOTE — ED Notes (Signed)
[facility] transport escorted patient to [facility]. Patient in a stable condition. No in any distress. Belongings sent with the patient. Patient is ambulatory.

## 2014-07-20 NOTE — ED Notes (Signed)
[facility] transportation notified.

## 2014-07-20 NOTE — ED Notes (Signed)
Called for report but was made aware that report has been received on patient. AC Kelly notified.

## 2014-07-20 NOTE — BH Assessment (Signed)
Per Dr. Aneta Mins and [name], NP patient meets inpatient criteria. Per Otila Kluver, [facility] patient accepted to bed 301-2 to Dr. Shea Evans. Clinician provided updates to RN. Clinician obtained voluntary consent and faxed to [facility].   Rigoberto Noel, MSW, LCSW Triage Specialist [phone]

## 2014-07-20 NOTE — Consult Note (Signed)
[facility] Consult   Reason for Consult:  Polysubstance abuse Referring Physician:  EDP Patient Identification: Jamie Hicks MRN:  [id number] Principal Diagnosis: Substance induced mood disorder Diagnosis:   Patient Active Problem List   Diagnosis Date Noted  . Substance induced mood disorder [F19.94] 07/19/2014  . Suicidal ideation [R45.851]   . Homicidal ideation [R45.850]   . Osteoarthritis [M19.90] 09/24/2012  . Prediabetes [R73.09] 09/24/2012  . Dyslipidemia [E78.5] 09/24/2012  . Vitamin D insufficiency [E55.9] 09/24/2012  . Hypothyroidism [E03.9] 09/22/2012  . Bilateral chronic knee pain [M25.561, M25.562, G89.29] 09/22/2012  . Smoker [Z72.0] 09/22/2012  . Elevated blood pressure [R03.0] 09/22/2012    Total Time spent with patient: 45 minutes  Subjective:   Jamie Hicks is a 59 y.o. female patient admitted with Substance induced mood disorder, Mood disorder, unspecified.Marland Kitchen  HPI: AA female, 59 years old was evaluated this evening for endorsing suicide and asking for assistance with Alcohol and drug use.  Patient stated that she need help with her thyroid problem, mental illness and multiple substance use.  Patient reports that she is not able to pay her bills because she uses her money for Marijuana, Alcohol and Cocaine.  She reported using Alcohol when she can afford it, uses Cocaine and Marijuana three or more days a week.  Patient could not describe quantity used.  Patient reported a previous diagnosis of Bipolar disorder long time ago and stated that she did not take  medications but was using "drugs" to self medicate.  She  Was hospitalized  in our [facility] back in 2009.  Patient reported that she has not been on any MH medications for years but she takes thyroid medication and blood pressure medications.  Patient does not see any outpatient Psychiatrist or therapist and stated that she has fired her PMD.  She reports mood swings and explosive anger at  times.  She reports poor sleep or none at at in 3-4 days.  She reports that her appetite is good.  Patient denies SI/HI/AVH but stated that she has been thinking of hurting the fathers of her 4 grown children.  She also reported that her father committed suicide few years ago by gun shot.   Patient look unkempt and disheveled.  We have accepted patient for admission and will be looking for inpatient Psychiatric bed.  We are waiting for her Alcohol, UDS  And TSH result to be made available.  Meanwhile we will be using Ativan on a prn basis for her detox and treatment of agitation.  Patient will be reevaluated in the morning when we have all of her lab results available.   Seen this am awake, alert and a little euphoric.  Patient yelled out at this writer" good to see you, I promise to get on my medications "  Patient reported good sleep and appetite and stated that she was able to sleep because she has no Cocaine to use and was not around"  Patient denies SI/HI/AVH.  Patient has been assigned a room and will be transferred as soon as transportastion becomes available.  HPI Elements:   Location:  Poly substance abuse, substance induced mood disorder, . Quality:  Insomnia, mood swings, poor anger management, irritability, . Severity:  severe. Timing:  Acute. Duration:  for almost 18 years. Context:  Seeking treatment for mood disorder, substance use.Marland Kitchen  Past Medical History:  Past Medical History  Diagnosis Date  . Back pain   . Thyroid disease     Past  Surgical History  Procedure Laterality Date  . Hernia repair    . Knee arthorscopy     Family History: History reviewed. No pertinent family history. Social History:  History  Alcohol Use  . Yes    Comment: occassionally     History  Drug Use No    History   Social History  . Marital Status: Legally Separated    Spouse Name: N/A    Number of Children: N/A  . Years of Education: N/A   Social History Main Topics  . Smoking status:  Current Some Day Smoker    Types: Cigarettes  . Smokeless tobacco: Never Used  . Alcohol Use: Yes     Comment: occassionally  . Drug Use: No  . Sexual Activity: None   Other Topics Concern  . None   Social History Narrative   Additional Social History:    History of alcohol / drug use?: Yes Longest period of sobriety (when/how long): UTA Negative Consequences of Use: Personal relationships, Financial Withdrawal Symptoms:  (may be currently intoxicated) Name of Substance 1: Alcohol  1 - Age of First Use: UTA 1 - Amount (size/oz): UTA 1 - Frequency: reported 1-2 times a month 1 - Duration: Binges 1 - Last Use / Amount: UTA Name of Substance 2: Marijuana 2 - Age of First Use: around 79 2 - Amount (size/oz): UTA 2 - Frequency: reported 1-2 times a week 2 - Duration: UTA 2 - Last Use / Amount: UTA Name of Substance 3: Cocaine 3 - Age of First Use: around 72  3 - Amount (size/oz): UTA 3 - Frequency: reported 2-3 times a week 3 - Duration: UTA  3 - Last Use / Amount: UTA                Allergies:   Allergies  Allergen Reactions  . Latex Rash    Vitals: Blood pressure 140/77, pulse 96, temperature 97.2 F (36.2 C), temperature source Oral, resp. rate 17, SpO2 99 %.  Risk to Self: Suicidal Ideation: Yes-Currently Present Suicidal Intent: No Is patient at risk for suicide?: Yes Suicidal Plan?: No (passive plan to jump off bridge ) Access to Means: Yes Specify Access to Suicidal Means:  (She has medications she could od on) What has been your use of drugs/alcohol within the last 12 months?:  (cocaine, marijuana, and alcohol) How many times?: 1 Other Self Harm Risks: drug use Triggers for Past Attempts: Unpredictable Intentional Self Injurious Behavior: None Risk to Others: Homicidal Ideation: No Thoughts of Harm to Others: No Current Homicidal Intent: No Current Homicidal Plan: No Access to Homicidal Means: No Identified Victim: None  History of harm to  others?: No Assessment of Violence: None Noted Violent Behavior Description: None Does patient have access to weapons?: No Criminal Charges Pending?: No Does patient have a court date: No Prior Inpatient Therapy: Prior Inpatient Therapy: Yes Prior Therapy Dates:  (years ago) Prior Therapy Facilty/Provider(s): [facility] Reason for Treatment: SI Prior Outpatient Therapy: Prior Outpatient Therapy: No (none known goes to [facility])  Current Facility-Administered Medications  Medication Dose Route Frequency Provider Last Rate Last Dose  . amLODipine (NORVASC) tablet 5 mg  5 mg Oral Daily Montine Circle, PA-C   5 mg at 07/20/14 0943  . hydrOXYzine (ATARAX/VISTARIL) tablet 50 mg  50 mg Oral TID PRN Delfin Gant, NP      . ibuprofen (ADVIL,MOTRIN) tablet 600 mg  600 mg Oral Q6H PRN Lurena Nida, NP   600 mg at  07/20/14 1916  . levothyroxine (SYNTHROID, LEVOTHROID) tablet 50 mcg  50 mcg Oral QAC breakfast Lurena Nida, NP   50 mcg at 07/20/14 6060  . loratadine (CLARITIN) tablet 10 mg  10 mg Oral Daily Montine Circle, PA-C   10 mg at 07/20/14 0943  . LORazepam (ATIVAN) tablet 1 mg  1 mg Oral Q6H PRN Delfin Gant, NP      . zolpidem (AMBIEN) tablet 5 mg  5 mg Oral QHS Delfin Gant, NP   5 mg at 07/19/14 2118   Current Outpatient Prescriptions  Medication Sig Dispense Refill  . amLODipine (NORVASC) 5 MG tablet Take 1 tablet (5 mg total) by mouth daily. (Patient not taking: Reported on 07/19/2014) 30 tablet 1  . ipratropium (ATROVENT) 0.06 % nasal spray Place 2 sprays into both nostrils 4 (four) times daily. (Patient not taking: Reported on 07/19/2014) 15 mL 1  . levothyroxine (SYNTHROID, LEVOTHROID) 50 MCG tablet Take 1 tablet (50 mcg total) by mouth daily before breakfast. (Patient not taking: Reported on 07/19/2014) 30 tablet 1  . predniSONE (DELTASONE) 20 MG tablet Take 1 tablet (20 mg total) by mouth 2 (two) times daily. (Patient not taking: Reported on 07/19/2014) 10 tablet 0  . traMADol  (ULTRAM) 50 MG tablet Take 1 tablet (50 mg total) by mouth every 8 (eight) hours as needed. (Patient not taking: Reported on 07/19/2014) 30 tablet 0    Musculoskeletal: Strength & Muscle Tone: within normal limits Gait & Station: normal Patient leans: N/A  Psychiatric Specialty Exam:     Blood pressure 140/77, pulse 96, temperature 97.2 F (36.2 C), temperature source Oral, resp. rate 17, SpO2 99 %.There is no height or weight on file to calculate BMI.  General Appearance: Casual and Disheveled  Eye Contact::  Good  Speech:  Clear and Coherent and Normal Rate  Volume:  Normal  Mood:  Anxious and Euphoric  Affect:  Labile and Full Range  Thought Process:  Loose and Tangential  Orientation:  Full (Time, Place, and Person)  Thought Content:  WDL  Suicidal Thoughts:  Yes.  without intent/plan  Homicidal Thoughts:  Yes.  without intent/plan  Memory:  Immediate;   Good Recent;   Good Remote;   Good  Judgement:  Impaired  Insight:  Shallow  Psychomotor Activity:  Normal  Concentration:  Poor  Recall:  NA  Fund of Knowledge:Poor  Language: Good  Akathisia:  NA  Handed:  Right  AIMS (if indicated):     Assets:  Desire for Improvement  ADL's:  Impaired  Cognition: WNL  Sleep:      Medical Decision Making: Established Problem, Worsening (2), Review of Medication Regimen & Side Effects (2) and Review of New Medication or Change in Dosage (2)  Treatment Plan Summary: Daily contact with patient to assess and evaluate symptoms and progress in treatment, Medication management and Plan Admit to inpatient Psychiatric unit.  Plan:  Recommend psychiatric Inpatient admission when medically cleared. Disposition: Accepted to [facility] and will be transferred to her assigned room.  Charmaine Downs, C   PMHNP-BC  07/20/2014 2:41 PM

## 2014-07-20 NOTE — BH Assessment (Signed)
[facility] at capacity. Contacted the following facilities for transfer:  BED AVAILABLE, FAXED CLINICAL INFORMATION: [facility]   [address] [place], [place], [facility] Triage Specialist [phone]

## 2014-07-21 ENCOUNTER — Encounter (HOSPITAL_COMMUNITY): Payer: Self-pay | Admitting: *Deleted

## 2014-07-21 DIAGNOSIS — R45850 Homicidal ideations: Secondary | ICD-10-CM

## 2014-07-21 DIAGNOSIS — F332 Major depressive disorder, recurrent severe without psychotic features: Principal | ICD-10-CM

## 2014-07-21 DIAGNOSIS — F1994 Other psychoactive substance use, unspecified with psychoactive substance-induced mood disorder: Secondary | ICD-10-CM

## 2014-07-21 DIAGNOSIS — R45851 Suicidal ideations: Secondary | ICD-10-CM

## 2014-07-21 NOTE — BHH Group Notes (Signed)
[facility] Group Notes:  (Clinical Social Work)  07/21/2014  11:15AM-12:05PM  Summary of Progress/Problems:   The main focus of today's process group was to   1)  discuss the importance of adding supports  2)  define health supports versus unhealthy supports  3)  identify the patient's current unhealthy supports and plan how to handle them  4)  Identify the patient's current healthy supports and plan what to add.  An emphasis was placed on using counselor, doctor, therapy groups, 12-step groups, and problem-specific support groups to expand supports.    The patient expressed full comprehension of the concepts presented, and agreed that there is a need to add more supports.  The patient stated that her 4 grown children (3 in particular) and 2 baby grandchildren are healthy supports overall.  She enjoys the unconditional love from the babies, and feels that she is sometimes judged by the adult children because they have seen her hurt in the past, and state to her that she knows better than to return to old behaviors.  She feels that she needs to change her environment where she is living, due to crime and drugs.  She also wants to open a spiritual relationship up with God and "find the real me."   Type of Therapy:  Process Group with Motivational Interviewing  Participation Level:  Active  Participation Quality:  Appropriate, Attentive, Sharing and Supportive  Affect:  Anxious and Blunted  Cognitive:  Alert and Appropriate  Insight:  Developing/Improving  Engagement in Therapy:  Engaged  Modes of Intervention:   Education, Support and Processing, Activity  [employer], LCSW 07/21/2014, 12:15pm

## 2014-07-21 NOTE — Tx Team (Signed)
Initial Interdisciplinary Treatment Plan   PATIENT STRESSORS: Marital or family conflict Substance abuse   PATIENT STRENGTHS: Average or above average intelligence Motivation for treatment/growth   PROBLEM LIST: Problem List/Patient Goals Date to be addressed Date deferred Reason deferred Estimated date of resolution  Substance abuse 07/21/14     Depression 07/21/14     "medication management, help with addiction"                                           DISCHARGE CRITERIA:  Improved stabilization in mood, thinking, and/or behavior Withdrawal symptoms are absent or subacute and managed without 24-hour nursing intervention  PRELIMINARY DISCHARGE PLAN: Attend 12-step recovery group Return to previous living arrangement  PATIENT/FAMIILY INVOLVEMENT: This treatment plan has been presented to and reviewed with the patient, Jamie Hicks.  The patient and family have been given the opportunity to ask questions and make suggestions.  Margaretann Loveless 07/21/2014, 1:10 AM

## 2014-07-21 NOTE — Progress Notes (Signed)
[facility] Group Notes:  (Nursing/MHT/Case Management/Adjunct)  Date:  07/21/2014  Time:  10:45  Type of Therapy:  Nurse Education  Participation Level:  Active  Participation Quality:  Attentive  Affect:  Appropriate  Cognitive:  Alert  Insight:  Good  Engagement in Group:  Supportive  Modes of Intervention:  Discussion  Summary of Progress/Problems:  Catalina Gravel 07/21/2014, 2:20 PM

## 2014-07-21 NOTE — Progress Notes (Signed)
Adult Psychoeducational Group Note  Date:  07/21/2014 Time:  3:19 PM  Group Topic/Focus:  Wrap-Up Group:   The focus of this group is to help patients review their daily goal of treatment and discuss progress on daily workbooks.  Participation Level:  Did Not Attend  Participation Quality:  Drowsy  Affect:  Flat  Cognitive:  na  Insight: None  Engagement in Group:  na  Modes of Intervention:  na  Additional Comments:  Pt was not in group..the patient stayed in bed during group time   Kimball Manske R 07/21/2014, 3:19 PM

## 2014-07-21 NOTE — BHH Suicide Risk Assessment (Signed)
[facility] Admission Suicide Risk Assessment   Nursing information obtained from:  Patient Demographic factors:  Divorced or widowed, Low socioeconomic status, Unemployed Current Mental Status:  NA Loss Factors:  Financial problems / change in socioeconomic status Historical Factors:  Family history of suicide, Family history of mental illness or substance abuse Risk Reduction Factors:  Sense of responsibility to family, Religious beliefs about death (Faith) Total Time spent with patient: 1.5 hours Principal Problem: Substance induced mood disorder Diagnosis:   Patient Active Problem List   Diagnosis Date Noted  . Substance induced mood disorder [F19.94] 07/19/2014  . Suicidal ideation [R45.851]   . Homicidal ideation [R45.850]   . Osteoarthritis [M19.90] 09/24/2012  . Prediabetes [R73.09] 09/24/2012  . Dyslipidemia [E78.5] 09/24/2012  . Vitamin D insufficiency [E55.9] 09/24/2012  . Hypothyroidism [E03.9] 09/22/2012  . Bilateral chronic knee pain [M25.561, M25.562, G89.29] 09/22/2012  . Smoker [Z72.0] 09/22/2012  . Elevated blood pressure [R03.0] 09/22/2012     Continued Clinical Symptoms:  Alcohol Use Disorder Identification Test Final Score (AUDIT): 20 The "Alcohol Use Disorders Identification Test", Guidelines for Use in Primary Care, Second Edition.  World Pharmacologist [facility]). Score between 0-7:  no or low risk or alcohol related problems. Score between 8-15:  moderate risk of alcohol related problems. Score between 16-19:  high risk of alcohol related problems. Score 20 or above:  warrants further diagnostic evaluation for alcohol dependence and treatment.   CLINICAL FACTORS:   Depression:   Anhedonia Hopelessness Impulsivity Dysthymia Alcohol/Substance Abuse/Dependencies More than one psychiatric diagnosis Unstable or Poor Therapeutic Relationship Previous Psychiatric Diagnoses and Treatments   Musculoskeletal: Strength & Muscle Tone: within normal limits Gait &  Station: normal Patient leans: N/A  Psychiatric Specialty Exam: Physical Exam  Constitutional: She appears well-developed and well-nourished.  Skin: She is not diaphoretic.    Review of Systems  Constitutional: Negative.   Cardiovascular: Negative for chest pain.  Gastrointestinal: Negative for nausea.  Neurological: Negative for tremors.  Psychiatric/Behavioral: Positive for depression and substance abuse. The patient is nervous/anxious.     Blood pressure 135/76, pulse 71, temperature 98.5 F (36.9 C), temperature source Oral, resp. rate 14, height 5\' 7"  (1.702 m), weight 112.946 kg (249 lb), last menstrual period 07/21/2014.Body mass index is 38.99 kg/(m^2).  General Appearance: Casual  Eye Contact::  Fair  Speech:  Slow  Volume:  Decreased  Mood:  Depressed and Dysphoric  Affect:  Congruent  Thought Process:  Coherent  Orientation:  Full (Time, Place, and Person)  Thought Content:  Rumination  Suicidal Thoughts:  No  Homicidal Thoughts:  No  Memory:  Immediate;   Fair Recent;   Fair  Judgement:  Poor  Insight:  Shallow  Psychomotor Activity:  Decreased  Concentration:  Fair  Recall:  [employer] of Knowledge:Fair  Language: Fair  Akathisia:  Negative  Handed:  Right  AIMS (if indicated):     Assets:  Desire for Improvement Vocational/Educational  Sleep:  Number of Hours: 5.75  Cognition: WNL  ADL's:  Intact     COGNITIVE FEATURES THAT CONTRIBUTE TO RISK:  Closed-mindedness and Polarized thinking    SUICIDE RISK:   Moderate:  Frequent suicidal ideation with limited intensity, and duration, some specificity in terms of plans, no associated intent, good self-control, limited dysphoria/symptomatology, some risk factors present, and identifiable protective factors, including available and accessible social support.  PLAN OF CARE: inpatient stabilization for suicidal tougths and depression. Medication management and support groups.   Medical Decision Making:   Established  Problem, Stable/Improving (1), Review of Psycho-Social Stressors (1), Review or order clinical lab tests (1), Established Problem, Worsening (2) and Review of Medication Regimen & Side Effects (2)  I certify that inpatient services furnished can reasonably be expected to improve the patient's condition.   Bryon Parker 07/21/2014, 10:24 AM

## 2014-07-21 NOTE — Progress Notes (Signed)
Admission note:  Pt is 59 year old AAF admitted to the services of Dr. Sabra Heck for substance abuse, depression, and medication management.  Pt was diagnosed with Bipolar disorder "a long time ago" but has used THC, cocaine, and alcohol to self medicate.  Pt is pleasant on admission.  Pt has had HI against fathers of her four grown children and anger outbursts.  Also has reported difficulty sleeping.  Pt states that she has sleep apnea very bad and snores extremely loud.  She requested to not have a roommate because she does not wish to keep someone else awake and feels certain that this would happen and be a conflict.  Pt was last admitted her in 2009.  Pt lists her daugher Gasper Lloyd as her emergency contact, and she may be reached at [phone].

## 2014-07-21 NOTE — H&P (Signed)
Psychiatric Admission Assessment Adult  Patient Identification: Jamie Hicks MRN:  [id number] Date of Evaluation:  07/21/2014 Chief Complaint:  POLYSUBSTANCE ABUSE Principal Diagnosis: Substance induced mood disorder Diagnosis:   Patient Active Problem List   Diagnosis Date Noted  . Major depressive disorder, recurrent, severe without psychotic features [F33.2]   . Substance induced mood disorder [F19.94] 07/19/2014  . Suicidal ideation [R45.851]   . Homicidal ideation [R45.850]   . Osteoarthritis [M19.90] 09/24/2012  . Prediabetes [R73.09] 09/24/2012  . Dyslipidemia [E78.5] 09/24/2012  . Vitamin D insufficiency [E55.9] 09/24/2012  . Hypothyroidism [E03.9] 09/22/2012  . Bilateral chronic knee pain [M25.561, M25.562, G89.29] 09/22/2012  . Smoker [Z72.0] 09/22/2012  . Elevated blood pressure [R03.0] 09/22/2012   History of Present Illness:: Patient states "I'm here cause of  depressed, suicidal, and substance abuse.  I was having suicidal thoughts but I didn't have a plan or nothing.  I need help with my depression and substance abuse.  I drink about 2-3 time a week. I use cocaine and weed.  I want to change I want help.  Look when I'm out there I'm out in harms way; I can hurt somebody or somebody can hurt me."  Patient agitated "Look I done had several people ask me these same questions and I'm tired of answering them.  I want help for my substance abuse and my depression."  Patient denies homicidal ideation,psychosis, and paranoia.  Patient states that she has been in rehab on several occassions in the past and the longed that she has been clean is 1 year.  Patient denies history of seizures and blackouts related to alcohol consumption withdrawal.   Patient states that she has been on psychotropic medications in past "But I don't take them cause I don't like how they make me feel."  Discussed several medication with patient related to mood control and patient refused.  States "let me  think it over and I will get back to you on if I'm willing to take any medications.  I want to get my thyroid straight and other stuff first."    Elements:  Location:  Worsening depression. Quality:  Polysubstance abuse. Severity:  suicidal thoughts. Timing:  several days. Associated Signs/Symptoms: Depression Symptoms:  depressed mood, feelings of worthlessness/guilt, difficulty concentrating, hopelessness, anxiety, loss of energy/fatigue, disturbed sleep, (Hypo) Manic Symptoms:  Impulsivity, Irritable Mood, Anxiety Symptoms:  Excessive Worry, Psychotic Symptoms:  Denies PTSD Symptoms: Had a traumatic exposure:  Father committed suicide; and just living in my neighborhood. Total Time spent with patient: 1 hour  Past Medical History:  Past Medical History  Diagnosis Date  . Back pain   . Thyroid disease     Past Surgical History  Procedure Laterality Date  . Hernia repair    . Knee arthorscopy     Family History: History reviewed. No pertinent family history. Social History:  History  Alcohol Use  . Yes    Comment: occassionally     History  Drug Use No    History   Social History  . Marital Status: Legally Separated    Spouse Name: N/A    Number of Children: N/A  . Years of Education: N/A   Social History Main Topics  . Smoking status: Current Some Day Smoker    Types: Cigarettes  . Smokeless tobacco: Never Used  . Alcohol Use: Yes     Comment: occassionally  . Drug Use: No  . Sexual Activity: None   Other Topics Concern  . None  Social History Narrative   Additional Social History:    History of alcohol / drug use?: Yes Longest period of sobriety (when/how long): UTA Negative Consequences of Use: Personal relationships                     Musculoskeletal: Strength & Muscle Tone: within normal limits Gait & Station: normal Patient leans: N/A  Psychiatric Specialty Exam: Physical Exam  Constitutional: She is oriented to person,  place, and time.  HENT:  Head: Normocephalic.  Neck: Normal range of motion.  Respiratory: Effort normal.  Musculoskeletal: Normal range of motion.  Neurological: She is alert and oriented to person, place, and time.  Skin: Skin is warm and dry.    Review of Systems  Psychiatric/Behavioral: Positive for depression, suicidal ideas and substance abuse. Negative for hallucinations and memory loss. The patient is nervous/anxious and has insomnia.   All other systems reviewed and are negative.   Blood pressure 135/76, pulse 71, temperature 98.5 F (36.9 C), temperature source Oral, resp. rate 14, height 5' 7"  (1.702 m), weight 112.946 kg (249 lb), last menstrual period 07/21/2014.Body mass index is 38.99 kg/(m^2).  General Appearance: Casual and Disheveled  Eye Contact::  Good  Speech:  Clear and Coherent and Normal Rate  Volume:  Normal  Mood:  Depressed and Irritable  Affect:  Blunt and Depressed  Thought Process:  Circumstantial  Orientation:  Full (Time, Place, and Person)  Thought Content:  Rumination  Suicidal Thoughts:  Yes.  without intent/plan  Homicidal Thoughts:  No  Memory:  Immediate;   Fair Recent;   Fair Remote;   Fair  Judgement:  Fair  Insight:  Shallow  Psychomotor Activity:  Normal  Concentration:  Fair  Recall:  [employer] of Knowledge:Fair  Language: Good  Akathisia:  No  Handed:  Right  AIMS (if indicated):     Assets:  Communication Skills Desire for Improvement Housing Social Support  ADL's:  Intact  Cognition: WNL  Sleep:  Number of Hours: 5.75   Risk to Self: Is patient at risk for suicide?: No What has been your use of drugs/alcohol within the last 12 months?: Patient reports she binges on alcohol 2-3 times monthly; uses cocaine 2-3 times monthly and THC 1-2 weekly Risk to Others:   Prior Inpatient Therapy:   Prior Outpatient Therapy:    Alcohol Screening: 1. How often do you have a drink containing alcohol?: 2 to 3 times a week 2. How many  drinks containing alcohol do you have on a typical day when you are drinking?: 5 or 6 (Pt unsure of quantity, drinks when she can get the money for it) 3. How often do you have six or more drinks on one occasion?: Less than monthly Preliminary Score: 3 4. How often during the last year have you found that you were not able to stop drinking once you had started?: Weekly 5. How often during the last year have you failed to do what was normally expected from you becasue of drinking?: Monthly 6. How often during the last year have you needed a first drink in the morning to get yourself going after a heavy drinking session?: Never 7. How often during the last year have you had a feeling of guilt of remorse after drinking?: Weekly 8. How often during the last year have you been unable to remember what happened the night before because you had been drinking?: Monthly 9. Have you or someone else been injured as  a result of your drinking?: No 10. Has a relative or friend or a doctor or another health worker been concerned about your drinking or suggested you cut down?: Yes, during the last year Alcohol Use Disorder Identification Test Final Score (AUDIT): 20 Brief Intervention: Patient declined brief intervention  Allergies:   Allergies  Allergen Reactions  . Latex Rash   Lab Results:  Results for orders placed or performed during the hospital encounter of 07/19/14 (from the past 48 hour(s))  CBC with Differential/Platelet     Status: None   Collection Time: 07/19/14  4:10 PM  Result Value Ref Range   WBC 9.7 4.0 - 10.5 K/uL   RBC 4.53 3.87 - 5.11 MIL/uL   Hemoglobin 13.0 12.0 - 15.0 g/dL   HCT 39.5 36.0 - 46.0 %   MCV 87.2 78.0 - 100.0 fL   MCH 28.7 26.0 - 34.0 pg   MCHC 32.9 30.0 - 36.0 g/dL   RDW 14.4 11.5 - 15.5 %   Platelets 238 150 - 400 K/uL   Neutrophils Relative % 74 43 - 77 %   Neutro Abs 7.2 1.7 - 7.7 K/uL   Lymphocytes Relative 20 12 - 46 %   Lymphs Abs 1.9 0.7 - 4.0 K/uL    Monocytes Relative 4 3 - 12 %   Monocytes Absolute 0.4 0.1 - 1.0 K/uL   Eosinophils Relative 2 0 - 5 %   Eosinophils Absolute 0.2 0.0 - 0.7 K/uL   Basophils Relative 0 0 - 1 %   Basophils Absolute 0.0 0.0 - 0.1 K/uL  Basic metabolic panel     Status: Abnormal   Collection Time: 07/19/14  4:10 PM  Result Value Ref Range   Sodium 134 (L) 135 - 145 mmol/L   Potassium 3.6 3.5 - 5.1 mmol/L   Chloride 99 96 - 112 mmol/L   CO2 26 19 - 32 mmol/L   Glucose, Bld 100 (H) 70 - 99 mg/dL   BUN 16 6 - 23 mg/dL   Creatinine, Ser 1.12 (H) 0.50 - 1.10 mg/dL   Calcium 9.8 8.4 - 10.5 mg/dL   GFR calc non Af Amer 53 (L) >90 mL/min   GFR calc Af Amer 62 (L) >90 mL/min    Comment: (NOTE) The eGFR has been calculated using the CKD EPI equation. This calculation has not been validated in all clinical situations. eGFR's persistently <90 mL/min signify possible Chronic Kidney Disease.    Anion gap 9 5 - 15  Ethanol     Status: None   Collection Time: 07/19/14  4:10 PM  Result Value Ref Range   Alcohol, Ethyl (B) <5 0 - 9 mg/dL    Comment:        LOWEST DETECTABLE LIMIT FOR SERUM ALCOHOL IS 11 mg/dL FOR MEDICAL PURPOSES ONLY   Acetaminophen level     Status: Abnormal   Collection Time: 07/19/14  4:10 PM  Result Value Ref Range   Acetaminophen (Tylenol), Serum <10.0 (L) 10 - 30 ug/mL    Comment:        THERAPEUTIC CONCENTRATIONS VARY SIGNIFICANTLY. A RANGE OF 10-30 ug/mL MAY BE AN EFFECTIVE CONCENTRATION FOR MANY PATIENTS. HOWEVER, SOME ARE BEST TREATED AT CONCENTRATIONS OUTSIDE THIS RANGE. ACETAMINOPHEN CONCENTRATIONS >150 ug/mL AT 4 HOURS AFTER INGESTION AND >50 ug/mL AT 12 HOURS AFTER INGESTION ARE OFTEN ASSOCIATED WITH TOXIC REACTIONS.   Salicylate level     Status: None   Collection Time: 07/19/14  4:10 PM  Result Value Ref Range  Salicylate Lvl <9.7 2.8 - 20.0 mg/dL  TSH     Status: Abnormal   Collection Time: 07/19/14  6:00 PM  Result Value Ref Range   TSH 40.500 (H) 0.350 -  4.500 uIU/mL    Comment: Performed at [facility]  Urine rapid drug screen (hosp performed)     Status: Abnormal   Collection Time: 07/20/14  7:15 AM  Result Value Ref Range   Opiates NONE DETECTED NONE DETECTED   Cocaine POSITIVE (A) NONE DETECTED   Benzodiazepines NONE DETECTED NONE DETECTED   Amphetamines NONE DETECTED NONE DETECTED   Tetrahydrocannabinol POSITIVE (A) NONE DETECTED   Barbiturates NONE DETECTED NONE DETECTED    Comment:        DRUG SCREEN FOR MEDICAL PURPOSES ONLY.  IF CONFIRMATION IS NEEDED FOR ANY PURPOSE, NOTIFY LAB WITHIN 5 DAYS.        LOWEST DETECTABLE LIMITS FOR URINE DRUG SCREEN Drug Class       Cutoff (ng/mL) Amphetamine      1000 Barbiturate      200 Benzodiazepine   948 Tricyclics       016 Opiates          300 Cocaine          300 THC              50    Current Medications: Current Facility-Administered Medications  Medication Dose Route Frequency Provider Last Rate Last Dose  . acetaminophen (TYLENOL) tablet 650 mg  650 mg Oral Q6H PRN Delfin Gant, NP      . alum & mag hydroxide-simeth (MAALOX/MYLANTA) 200-200-20 MG/5ML suspension 30 mL  30 mL Oral Q4H PRN Delfin Gant, NP      . amLODipine (NORVASC) tablet 5 mg  5 mg Oral Daily Delfin Gant, NP   5 mg at 07/21/14 0836  . hydrOXYzine (ATARAX/VISTARIL) tablet 50 mg  50 mg Oral TID PRN Delfin Gant, NP      . ibuprofen (ADVIL,MOTRIN) tablet 600 mg  600 mg Oral Q6H PRN Delfin Gant, NP   600 mg at 07/21/14 0836  . levothyroxine (SYNTHROID, LEVOTHROID) tablet 50 mcg  50 mcg Oral QAC breakfast Delfin Gant, NP   50 mcg at 07/21/14 0605  . loratadine (CLARITIN) tablet 10 mg  10 mg Oral Daily Delfin Gant, NP   10 mg at 07/21/14 0836  . LORazepam (ATIVAN) tablet 1 mg  1 mg Oral Q6H PRN Delfin Gant, NP   1 mg at 07/20/14 2224  . magnesium hydroxide (MILK OF MAGNESIA) suspension 30 mL  30 mL Oral Daily PRN Delfin Gant, NP      . zolpidem  (AMBIEN) tablet 5 mg  5 mg Oral QHS Delfin Gant, NP   5 mg at 07/20/14 2224   PTA Medications: Prescriptions prior to admission  Medication Sig Dispense Refill Last Dose  . amLODipine (NORVASC) 5 MG tablet Take 1 tablet (5 mg total) by mouth daily. (Patient not taking: Reported on 07/19/2014) 30 tablet 1 Unknown at Unknown Time  . ipratropium (ATROVENT) 0.06 % nasal spray Place 2 sprays into both nostrils 4 (four) times daily. (Patient not taking: Reported on 07/19/2014) 15 mL 1 Unknown at Unknown Time  . levothyroxine (SYNTHROID, LEVOTHROID) 50 MCG tablet Take 1 tablet (50 mcg total) by mouth daily before breakfast. (Patient not taking: Reported on 07/19/2014) 30 tablet 1     Previous Psychotropic Medications: Yes   Substance Abuse  History in the last 12 months:  Yes.      Consequences of Substance Abuse: Family Consequences:  Family discord  Results for orders placed or performed during the hospital encounter of 07/19/14 (from the past 72 hour(s))  CBC with Differential/Platelet     Status: None   Collection Time: 07/19/14  4:10 PM  Result Value Ref Range   WBC 9.7 4.0 - 10.5 K/uL   RBC 4.53 3.87 - 5.11 MIL/uL   Hemoglobin 13.0 12.0 - 15.0 g/dL   HCT 39.5 36.0 - 46.0 %   MCV 87.2 78.0 - 100.0 fL   MCH 28.7 26.0 - 34.0 pg   MCHC 32.9 30.0 - 36.0 g/dL   RDW 14.4 11.5 - 15.5 %   Platelets 238 150 - 400 K/uL   Neutrophils Relative % 74 43 - 77 %   Neutro Abs 7.2 1.7 - 7.7 K/uL   Lymphocytes Relative 20 12 - 46 %   Lymphs Abs 1.9 0.7 - 4.0 K/uL   Monocytes Relative 4 3 - 12 %   Monocytes Absolute 0.4 0.1 - 1.0 K/uL   Eosinophils Relative 2 0 - 5 %   Eosinophils Absolute 0.2 0.0 - 0.7 K/uL   Basophils Relative 0 0 - 1 %   Basophils Absolute 0.0 0.0 - 0.1 K/uL  Basic metabolic panel     Status: Abnormal   Collection Time: 07/19/14  4:10 PM  Result Value Ref Range   Sodium 134 (L) 135 - 145 mmol/L   Potassium 3.6 3.5 - 5.1 mmol/L   Chloride 99 96 - 112 mmol/L   CO2 26 19 -  32 mmol/L   Glucose, Bld 100 (H) 70 - 99 mg/dL   BUN 16 6 - 23 mg/dL   Creatinine, Ser 1.12 (H) 0.50 - 1.10 mg/dL   Calcium 9.8 8.4 - 10.5 mg/dL   GFR calc non Af Amer 53 (L) >90 mL/min   GFR calc Af Amer 62 (L) >90 mL/min    Comment: (NOTE) The eGFR has been calculated using the CKD EPI equation. This calculation has not been validated in all clinical situations. eGFR's persistently <90 mL/min signify possible Chronic Kidney Disease.    Anion gap 9 5 - 15  Ethanol     Status: None   Collection Time: 07/19/14  4:10 PM  Result Value Ref Range   Alcohol, Ethyl (B) <5 0 - 9 mg/dL    Comment:        LOWEST DETECTABLE LIMIT FOR SERUM ALCOHOL IS 11 mg/dL FOR MEDICAL PURPOSES ONLY   Acetaminophen level     Status: Abnormal   Collection Time: 07/19/14  4:10 PM  Result Value Ref Range   Acetaminophen (Tylenol), Serum <10.0 (L) 10 - 30 ug/mL    Comment:        THERAPEUTIC CONCENTRATIONS VARY SIGNIFICANTLY. A RANGE OF 10-30 ug/mL MAY BE AN EFFECTIVE CONCENTRATION FOR MANY PATIENTS. HOWEVER, SOME ARE BEST TREATED AT CONCENTRATIONS OUTSIDE THIS RANGE. ACETAMINOPHEN CONCENTRATIONS >150 ug/mL AT 4 HOURS AFTER INGESTION AND >50 ug/mL AT 12 HOURS AFTER INGESTION ARE OFTEN ASSOCIATED WITH TOXIC REACTIONS.   Salicylate level     Status: None   Collection Time: 07/19/14  4:10 PM  Result Value Ref Range   Salicylate Lvl <8.4 2.8 - 20.0 mg/dL  TSH     Status: Abnormal   Collection Time: 07/19/14  6:00 PM  Result Value Ref Range   TSH 40.500 (H) 0.350 - 4.500 uIU/mL    Comment: Performed  at [facility]  Urine rapid drug screen (hosp performed)     Status: Abnormal   Collection Time: 07/20/14  7:15 AM  Result Value Ref Range   Opiates NONE DETECTED NONE DETECTED   Cocaine POSITIVE (A) NONE DETECTED   Benzodiazepines NONE DETECTED NONE DETECTED   Amphetamines NONE DETECTED NONE DETECTED   Tetrahydrocannabinol POSITIVE (A) NONE DETECTED   Barbiturates NONE DETECTED NONE  DETECTED    Comment:        DRUG SCREEN FOR MEDICAL PURPOSES ONLY.  IF CONFIRMATION IS NEEDED FOR ANY PURPOSE, NOTIFY LAB WITHIN 5 DAYS.        LOWEST DETECTABLE LIMITS FOR URINE DRUG SCREEN Drug Class       Cutoff (ng/mL) Amphetamine      1000 Barbiturate      200 Benzodiazepine   567 Tricyclics       209 Opiates          300 Cocaine          300 THC              50     Observation Level/Precautions:  15 minute checks  Laboratory:  CBC Chemistry Profile UDS UA  Psychotherapy:  Individual and group sessions  Medications:  Will start home medications and other medications as appropriate for stabilization  Consultations:  Psychiatry  Discharge Concerns:  Safety, stabilization, and risk of access to medication and medication stabilization   Estimated LOS:  5-7 days  Other:     Psychological Evaluations: Yes   Treatment Plan Summary: Daily contact with patient to assess and evaluate symptoms and progress in treatment and Medication management   1. Admit for crisis management and stabilization.  2. Medication management to reduce current symptoms to base line and improve the patient's overall level of functioning:  3. Treat health problems as indicated.  4. Develop treatment plan to decrease risk of relapse upon discharge and the need for readmission.  5. Psycho-social education regarding relapse prevention and self- care.  6. Health care follow up as needed for medical problems.  7. Restart home medications where appropriate.  Medical Decision Making:  Established Problem, Stable/Improving (1), Review of Psycho-Social Stressors (1) and Review of Medication Regimen & Side Effects (2)  I certify that inpatient services furnished can reasonably be expected to improve the patient's condition.    Rankin, Shuvon,FNP-BC 2/7/201611:59 AM I have examined the patient and agreed with the findings of H&P and treatment plan.

## 2014-07-21 NOTE — Progress Notes (Signed)
D.  Pt pleasant and bright on approach, denies complaints at this time.  Interacting appropriately with peers on unit.  Sat in dayroom watching the Super Bowl with peers until bedtime. Denies SI/HI/hallucinations at this time.  A.  Support and encouragement offered  R. Pt remains safe on the unit, will continue to monitor.
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
## 2014-07-21 NOTE — Progress Notes (Signed)
D: Pt denies SI/HI/AV. Pt is pleasant and cooperative. Pt rates depression at a 7, anxiety at a 0, and Helplessness/hopelessness at a 7.  A: Pt was offered support and encouragement. Pt was given scheduled medications. Pt was encourage to attend groups. Q 15 minute checks were done for safety.  R:Pt attends groups and interacts well with peers and staff. Pt taking medication. Pt has no complaints.Pt receptive to treatment and safety maintained on unit.

## 2014-07-22 DIAGNOSIS — F1424 Cocaine dependence with cocaine-induced mood disorder: Secondary | ICD-10-CM | POA: Diagnosis present

## 2014-07-22 MED ORDER — LOPERAMIDE HCL 2 MG PO CAPS
2.0000 mg | ORAL_CAPSULE | ORAL | Status: DC | PRN
  Administered 2014-07-22 – 2014-07-26 (×11): 2 mg via ORAL
  Filled 2014-07-22 (×11): qty 1

## 2014-07-22 MED ORDER — ONDANSETRON 4 MG PO TBDP
4.0000 mg | ORAL_TABLET | Freq: Three times a day (TID) | ORAL | Status: DC | PRN
  Administered 2014-07-22 – 2014-07-23 (×2): 4 mg via ORAL
  Filled 2014-07-22 (×2): qty 1

## 2014-07-22 NOTE — BHH Group Notes (Signed)
[place] LCSW Group Therapy          Overcoming Obstacles       1:15 -2:30        07/22/2014   3:18 PM   Type of Therapy:  Group Therapy  Participation Level:  Appropriate  Participation Quality:  Appropriate  Affect:  Appropriate, Alert  Cognitive:  Attentive Appropriate  Insight: Developing/Improving   Engagement in Therapy: Developing/Imprvoing   Modes of Intervention:  Discussion Exploration  Education Rapport BuildingProblem-Solving Support  Summary of Progress/Problems:  The main focus of today's group was overcoming obstacles.  Patient shared the obstacles she has to overcome is the past and substance abuse.          Patient shared she did not feel comfortable discussing problems any further.   Concha Pyo 07/22/2014   3:18 PM

## 2014-07-22 NOTE — BHH Group Notes (Addendum)
[facility] LCSW Aftercare Discharge Planning Group Note   07/22/2014 11:59 AM  Patient did not attend group.  Concha Pyo   07/22/2014   11:59 AM

## 2014-07-22 NOTE — Progress Notes (Signed)
[facility] MD Progress Note  07/22/2014 5:33 PM NATHAN STALLWORTH  MRN:  [id number] Subjective:  Jamie Hicks states she is having a hard time. She is dealing with a lot of shame and guilt for having relapses and "done this to her family." states she is tired of doing the same thing over and over again. She states that were she lives is a trigger as there are people using all the time. She states she needs to find different living arrangements. Not sure how to go about doing this. She admits she deals with depression.  She is not taking her medications, including her thyroid  Principal Problem: Substance induced mood disorder Diagnosis:   Patient Active Problem List   Diagnosis Date Noted  . Major depressive disorder, recurrent, severe without psychotic features [F33.2]   . Substance induced mood disorder [F19.94] 07/19/2014  . Suicidal ideation [R45.851]   . Homicidal ideation [R45.850]   . Osteoarthritis [M19.90] 09/24/2012  . Prediabetes [R73.09] 09/24/2012  . Dyslipidemia [E78.5] 09/24/2012  . Vitamin D insufficiency [E55.9] 09/24/2012  . Hypothyroidism [E03.9] 09/22/2012  . Bilateral chronic knee pain [M25.561, M25.562, G89.29] 09/22/2012  . Smoker [Z72.0] 09/22/2012  . Elevated blood pressure [R03.0] 09/22/2012   Total Time spent with patient: 30 minutes   Past Medical History:  Past Medical History  Diagnosis Date  . Back pain   . Thyroid disease     Past Surgical History  Procedure Laterality Date  . Hernia repair    . Knee arthorscopy     Family History: History reviewed. No pertinent family history. Social History:  History  Alcohol Use  . Yes    Comment: occassionally     History  Drug Use No    History   Social History  . Marital Status: Legally Separated    Spouse Name: N/A    Number of Children: N/A  . Years of Education: N/A   Social History Main Topics  . Smoking status: Current Some Day Smoker    Types: Cigarettes  . Smokeless tobacco: Never Used  .  Alcohol Use: Yes     Comment: occassionally  . Drug Use: No  . Sexual Activity: None   Other Topics Concern  . None   Social History Narrative   Additional History:    Sleep: Poor  Appetite:  Poor   Assessment:   Musculoskeletal: Strength & Muscle Tone: within normal limits Gait & Station: normal Patient leans: N/A   Psychiatric Specialty Exam: Physical Exam  Review of Systems  Constitutional: Positive for malaise/fatigue.  HENT: Negative.   Eyes: Negative.   Respiratory: Negative.   Cardiovascular: Negative.   Gastrointestinal: Negative.   Genitourinary: Negative.   Musculoskeletal: Positive for back pain and joint pain.  Skin: Negative.   Neurological: Positive for weakness.  Endo/Heme/Allergies: Negative.   Psychiatric/Behavioral: Positive for depression and substance abuse. The patient is nervous/anxious.     Blood pressure 137/85, pulse 65, temperature 98.4 F (36.9 C), temperature source Oral, resp. rate 16, height 5\' 7"  (1.702 m), weight 112.946 kg (249 lb), last menstrual period 07/21/2014.Body mass index is 38.99 kg/(m^2).  General Appearance: Disheveled  Eye Sport and exercise psychologist::  Fair  Speech:  Clear and Coherent  Volume:  fluctuates  Mood:  Anxious, Depressed and worried  Affect:  anxious worried sad  Thought Process:  Coherent and Goal Directed  Orientation:  Full (Time, Place, and Person)  Thought Content:  symptoms events worries concerns, dealing with shame and guilt  Suicidal Thoughts:  Yes.  without intent/plan  Homicidal Thoughts:  No  Memory:  Immediate;   Fair Recent;   Fair Remote;   Fair  Judgement:  Fair  Insight:  Present  Psychomotor Activity:  Restlessness  Concentration:  Fair  Recall:  [employer] of Knowledge:Fair  Language: Fair  Akathisia:  No  Handed:  Right  AIMS (if indicated):     Assets:  Desire for Improvement  ADL's:  Intact  Cognition: WNL  Sleep:  Number of Hours: 5.75     Current Medications: Current  Facility-Administered Medications  Medication Dose Route Frequency Provider Last Rate Last Dose  . acetaminophen (TYLENOL) tablet 650 mg  650 mg Oral Q6H PRN Delfin Gant, NP      . alum & mag hydroxide-simeth (MAALOX/MYLANTA) 200-200-20 MG/5ML suspension 30 mL  30 mL Oral Q4H PRN Delfin Gant, NP   30 mL at 07/22/14 1405  . amLODipine (NORVASC) tablet 5 mg  5 mg Oral Daily Delfin Gant, NP   5 mg at 07/22/14 0740  . hydrOXYzine (ATARAX/VISTARIL) tablet 50 mg  50 mg Oral TID PRN Delfin Gant, NP      . ibuprofen (ADVIL,MOTRIN) tablet 600 mg  600 mg Oral Q6H PRN Delfin Gant, NP   600 mg at 07/22/14 0741  . levothyroxine (SYNTHROID, LEVOTHROID) tablet 50 mcg  50 mcg Oral QAC breakfast Delfin Gant, NP   50 mcg at 07/22/14 0608  . loratadine (CLARITIN) tablet 10 mg  10 mg Oral Daily Delfin Gant, NP   10 mg at 07/22/14 0740  . LORazepam (ATIVAN) tablet 1 mg  1 mg Oral Q6H PRN Delfin Gant, NP   1 mg at 07/20/14 2224  . magnesium hydroxide (MILK OF MAGNESIA) suspension 30 mL  30 mL Oral Daily PRN Delfin Gant, NP      . zolpidem (AMBIEN) tablet 5 mg  5 mg Oral QHS Delfin Gant, NP   5 mg at 07/21/14 2148    Lab Results: No results found for this or any previous visit (from the past 48 hour(s)).  Physical Findings: AIMS: Facial and Oral Movements Muscles of Facial Expression: None, normal Lips and Perioral Area: None, normal Jaw: None, normal Tongue: None, normal,Extremity Movements Upper (arms, wrists, hands, fingers): None, normal Lower (legs, knees, ankles, toes): None, normal, Trunk Movements Neck, shoulders, hips: None, normal, Overall Severity Severity of abnormal movements (highest score from questions above): None, normal Incapacitation due to abnormal movements: None, normal Patient's awareness of abnormal movements (rate only patient's report): No Awareness, Dental Status Current problems with teeth and/or dentures?:  No Does patient usually wear dentures?: No  CIWA:  CIWA-Ar Total: 3 COWS:     Treatment Plan Summary: Daily contact with patient to assess and evaluate symptoms and progress in treatment and Medication management Supportive approach/coping skills/relapse prevention Cocaine dependence: reassess for mood instability coming from withdrawal, develop a relapse prevention plan Major Depression: Discuss medication options Hypothyroidism ( TSH: 40.5 ) resume her thyroid replacement Explore placement options  Medical Decision Making:  Review of Psycho-Social Stressors (1), Review or order clinical lab tests (1), Review of Medication Regimen & Side Effects (2) and Review of New Medication or Change in Dosage (2)     Jamie Hicks A 07/22/2014, 5:33 PM

## 2014-07-22 NOTE — Progress Notes (Signed)
D: Pt denies SI/HI/AV. Pt is pleasant and cooperative. Pt rates depression at a 7, anxiety at a 3, and Helplessness/hopelessness at a 5  A: Pt was offered support and encouragement. Pt was given scheduled medications. Pt was encourage to attend groups. Q 15 minute checks were done for safety.  R:Pt attends groups and interacts well with peers and staff. Pt taking medication. Pt continues to complain about heartburn and given appropriate medication.Pt receptive to treatment and safety maintained on unit.

## 2014-07-23 MED ORDER — PANTOPRAZOLE SODIUM 40 MG PO TBEC
40.0000 mg | DELAYED_RELEASE_TABLET | Freq: Every day | ORAL | Status: DC
  Administered 2014-07-23 – 2014-07-30 (×8): 40 mg via ORAL
  Filled 2014-07-23 (×12): qty 1

## 2014-07-23 NOTE — Tx Team (Signed)
Interdisciplinary Treatment Plan Update (Adult) Date: 07/23/2014   Time Reviewed: 9:30 AM  Progress in Treatment: Attending groups: Yes Participating in groups: Yes Taking medication as prescribed: Yes Tolerating medication: Yes Family/Significant other contact made: No, CSW assessing for appropriate contacts Patient understands diagnosis: Yes Discussing patient identified problems/goals with staff: Yes Medical problems stabilized or resolved: Yes Denies suicidal/homicidal ideation: Yes Issues/concerns per patient self-inventory: Yes Other:  New problem(s) identified: N/A  Discharge Plan or Barriers:   2/9: CSW continuing to assess. Per treatment team, patient is requesting long term residential treatment at discharge.  Reason for Continuation of Hospitalization:  Depression Anxiety Medication Stabilization   Comments: N/A  Estimated length of stay: 3-5 days  For review of initial/current patient goals, please see plan of care.  Patient is a 59 year old female admitted following SI and polysubstance abuse. Patient lives in [place] with several family members. Patient will benefit from crisis stabilization, medication evaluation, group therapy, and psycho education in addition to case management for discharge planning. Patient and CSW reviewed pt's identified goals and treatment plan. Pt verbalized understanding and agreed to treatment plan.   Attendees: Patient:    Family:    Physician: Dr. Parke Poisson; Dr. Sabra Heck 07/23/2014 9:30 AM  Nursing: Langston Masker; Mayra Neer; Para March; Gaylan Gerold, RN 07/23/2014 9:30 AM  Clinical Social Worker: Erasmo Downer Eylin Pontarelli,  [place] 07/23/2014 9:30 AM  Other: Joette Catching, LCSW 07/23/2014 9:30 AM  Other: Lucinda Dell, [place] Sessions Liaison 07/23/2014 9:30 AM  Other: Lars Pinks, Case Manager 07/23/2014 9:30 AM  Other: Maxie Better, [place] [facility] 07/23/2014 9:30 AM  Other:    Other:    Other:       Scribe for Treatment Team:  Tilden Fossa, MSW,  [employer] [phone]

## 2014-07-23 NOTE — Progress Notes (Signed)
[facility] [id number]  The focus of this group is to educate the patient on the purpose and policies of crisis stabilization and provide a format to answer questions about their admission.  The group details unit policies and expectations of patients while admitted.

## 2014-07-23 NOTE — Progress Notes (Signed)
[facility] MD Progress Note  07/23/2014 6:28 PM Jamie Hicks  MRN:  [id number] Subjective:  Jamie Hicks states that she is trying to get better. She is still dealing with the shame and the guilt for the relapse. She is afraid of going back to where she was living before as states she is sorrounded by drugs and she will have a hard time abstaining. She would like to work with a specific resource person out of the [facility] to see if she can get some help getting out of where she is at. She is also concerned about her thyroid. Admits she usually does not take the thyroid and that she does not have a good rational explanation for not taking. .  Principal Problem: Substance induced mood disorder Diagnosis:   Patient Active Problem List   Diagnosis Date Noted  . Cocaine dependence with cocaine-induced mood disorder [F14.24] 07/22/2014    Priority: High  . Major depressive disorder, recurrent, severe without psychotic features [F33.2]     Priority: High  . Hypothyroidism [E03.9] 09/22/2012    Priority: High  . Substance induced mood disorder [F19.94] 07/19/2014    Priority: Medium  . Suicidal ideation [R45.851]   . Homicidal ideation [R45.850]   . Osteoarthritis [M19.90] 09/24/2012  . Prediabetes [R73.09] 09/24/2012  . Dyslipidemia [E78.5] 09/24/2012  . Vitamin D insufficiency [E55.9] 09/24/2012  . Bilateral chronic knee pain [M25.561, M25.562, G89.29] 09/22/2012  . Smoker [Z72.0] 09/22/2012  . Elevated blood pressure [R03.0] 09/22/2012   Total Time spent with patient: 30 minutes   Past Medical History:  Past Medical History  Diagnosis Date  . Back pain   . Thyroid disease     Past Surgical History  Procedure Laterality Date  . Hernia repair    . Knee arthorscopy     Family History: History reviewed. No pertinent family history. Social History:  History  Alcohol Use  . Yes    Comment: occassionally     History  Drug Use No    History   Social History  . Marital Status: Legally  Separated    Spouse Name: N/A    Number of Children: N/A  . Years of Education: N/A   Social History Main Topics  . Smoking status: Current Some Day Smoker    Types: Cigarettes  . Smokeless tobacco: Never Used  . Alcohol Use: Yes     Comment: occassionally  . Drug Use: No  . Sexual Activity: None   Other Topics Concern  . None   Social History Narrative   Additional History:    Sleep: Poor  Appetite:  Poor   Assessment:   Musculoskeletal: Strength & Muscle Tone: within normal limits Gait & Station: normal Patient leans: N/A   Psychiatric Specialty Exam: Physical Exam  Review of Systems  Constitutional: Positive for malaise/fatigue.  Eyes: Negative.   Respiratory: Negative.   Cardiovascular: Negative.   Gastrointestinal: Positive for nausea.  Genitourinary: Negative.   Musculoskeletal: Positive for back pain, joint pain and neck pain.  Skin: Negative.   Neurological: Positive for weakness.  Endo/Heme/Allergies: Negative.   Psychiatric/Behavioral: Positive for depression and substance abuse. The patient is nervous/anxious and has insomnia.     Blood pressure 131/81, pulse 82, temperature 98.6 F (37 C), temperature source Oral, resp. rate 16, height 5\' 7"  (1.702 m), weight 112.946 kg (249 lb), last menstrual period 07/21/2014.Body mass index is 38.99 kg/(m^2).  General Appearance: Fairly Groomed  Engineer, water::  Fair  Speech:  Clear and Coherent  Volume:  Normal  Mood:  Anxious, Depressed and Dysphoric  Affect:  sad, anxious worried  Thought Process:  Coherent and Goal Directed  Orientation:  Full (Time, Place, and Person)  Thought Content:  symptoms worries concerns not wanting to go back to the way things were before  Suicidal Thoughts:  No  Homicidal Thoughts:  No  Memory:  Immediate;   Fair Recent;   Fair Remote;   Fair  Judgement:  Fair  Insight:  Present and Shallow  Psychomotor Activity:  Restlessness  Concentration:  Fair  Recall:  [employer]  of Knowledge:Fair  Language: Fair  Akathisia:  No  Handed:  Right  AIMS (if indicated):     Assets:  Desire for Improvement  ADL's:  Intact  Cognition: WNL  Sleep:  Number of Hours: 6     Current Medications: Current Facility-Administered Medications  Medication Dose Route Frequency Provider Last Rate Last Dose  . acetaminophen (TYLENOL) tablet 650 mg  650 mg Oral Q6H PRN Delfin Gant, NP      . alum & mag hydroxide-simeth (MAALOX/MYLANTA) 200-200-20 MG/5ML suspension 30 mL  30 mL Oral Q4H PRN Delfin Gant, NP   30 mL at 07/23/14 1520  . amLODipine (NORVASC) tablet 5 mg  5 mg Oral Daily Delfin Gant, NP   5 mg at 07/23/14 0817  . hydrOXYzine (ATARAX/VISTARIL) tablet 50 mg  50 mg Oral TID PRN Delfin Gant, NP      . ibuprofen (ADVIL,MOTRIN) tablet 600 mg  600 mg Oral Q6H PRN Delfin Gant, NP   600 mg at 07/22/14 0741  . levothyroxine (SYNTHROID, LEVOTHROID) tablet 50 mcg  50 mcg Oral QAC breakfast Delfin Gant, NP   50 mcg at 07/23/14 0630  . loperamide (IMODIUM) capsule 2 mg  2 mg Oral PRN Laverle Hobby, PA-C   2 mg at 07/22/14 2139  . loratadine (CLARITIN) tablet 10 mg  10 mg Oral Daily Delfin Gant, NP   10 mg at 07/23/14 0818  . LORazepam (ATIVAN) tablet 1 mg  1 mg Oral Q6H PRN Delfin Gant, NP   1 mg at 07/20/14 2224  . magnesium hydroxide (MILK OF MAGNESIA) suspension 30 mL  30 mL Oral Daily PRN Delfin Gant, NP      . ondansetron (ZOFRAN-ODT) disintegrating tablet 4 mg  4 mg Oral Q8H PRN Laverle Hobby, PA-C   4 mg at 07/23/14 0540  . zolpidem (AMBIEN) tablet 5 mg  5 mg Oral QHS Delfin Gant, NP   5 mg at 07/22/14 2142    Lab Results: No results found for this or any previous visit (from the past 48 hour(s)).  Physical Findings: AIMS: Facial and Oral Movements Muscles of Facial Expression: None, normal Lips and Perioral Area: None, normal Jaw: None, normal Tongue: None, normal,Extremity Movements Upper (arms,  wrists, hands, fingers): None, normal Lower (legs, knees, ankles, toes): None, normal, Trunk Movements Neck, shoulders, hips: None, normal, Overall Severity Severity of abnormal movements (highest score from questions above): None, normal Incapacitation due to abnormal movements: None, normal Patient's awareness of abnormal movements (rate only patient's report): No Awareness, Dental Status Current problems with teeth and/or dentures?: No Does patient usually wear dentures?: No  CIWA:  CIWA-Ar Total: 3 COWS:     Treatment Plan Summary: Daily contact with patient to assess and evaluate symptoms and progress in treatment and Medication management Cocaine Dependence: continue to assess and address withdrawal mood fluctuations Reassess and address  her mood instability Develop a relapse prevention plan Reasses and address any other co morbidities Hypothyroidism: continue to replace thyroid hormone Discuss how important is to keep her thyroid under control given the many effects of the thyroid including effects on the mood  Medical Decision Making:  Review of Psycho-Social Stressors (1), Review or order clinical lab tests (1), Review of Medication Regimen & Side Effects (2) and Review of New Medication or Change in Dosage (2)     Jahree Dermody A 07/23/2014, 6:28 PM

## 2014-07-23 NOTE — Progress Notes (Signed)
Pt observed lying in bed, reporting that she has had diarrhea and nausea since lunch time today.  Pt thinks that it may be something that she ate at lunch causing her distress.  Pt states she has been miserable this afternoon, and has not received any medication for her symptoms.  Pt denies SI/HI/AV at this time.  She is asking to leave as she says she has a lot to do, and staff is not addressing her present needs.  Writer assured that the PA on call would be notified of her symptoms.  Orders were received, and pt was given imodium and zofran for her symptoms.  Pt was appreciative and shortly went to sleep afterwards.  Discharge plans are still in process.  Safety maintained with q15 minute checks.

## 2014-07-23 NOTE — Progress Notes (Signed)
Pt attended spiritual care group on grief and loss facilitated by chaplain Jerene Pitch. Group opened with brief discussion and psycho-social ed around grief and loss in relationships and in relation to self - identifying life patterns, circumstances, changes that cause losses. Established group norm of speaking from own life experience. Group goal of establishing open and affirming space for members to share loss and experience with grief, normalize grief experience and provide psycho social education and grief support.  Group drew on narrative and Alderian therapeutic modalities.     Jamie Hicks was present throughout group with appropriate mood.  She connected with another group member's story of loss of relationship to death.  Jamie Hicks knew this group member from earlier in their life, though they had not seen one another in years.  Jamie Hicks described loss of her father to suicide and the process she has journeyed through in grieving that loss.  Was able to acknowledge differences in experience and express empathy for other group member's pain.  Identified with other group member around cultural expectations as older african american mother.    [place], [place]

## 2014-07-23 NOTE — Progress Notes (Signed)
D: Pt denies SI/HI/AV. Pt is pleasant and cooperative. Pt rates depression at a 6, anxiety at a 5, and Helplessness/hopelessness at a 8.  A: Pt was offered support and encouragement. Pt was given scheduled medications. Pt was encourage to attend groups. Q 15 minute checks were done for safety.  R:Pt attends  groups and interacts well with peers and staff. Pt taking medication. Pt feels she is uneasy with the discharge plans and wants ti discuss it more tomorrow. Pt feels now she is nervous about going away fo rtreatment as she worry's who will pay her bills when she is gone..Pt receptive to treatment and safety maintained on unit.

## 2014-07-24 MED ORDER — NICOTINE 14 MG/24HR TD PT24
14.0000 mg | MEDICATED_PATCH | Freq: Every day | TRANSDERMAL | Status: DC
  Administered 2014-07-25 – 2014-07-30 (×6): 14 mg via TRANSDERMAL
  Filled 2014-07-24: qty 14
  Filled 2014-07-24 (×8): qty 1

## 2014-07-24 NOTE — BHH Group Notes (Addendum)
[place] LCSW Group Therapy  Emotional Regulation 1:15 - 2: 30 PM        07/24/2014  2:44 PM    Type of Therapy:  Group Therapy  Participation Level:  Appropriate  Participation Quality:  Appropriate  Affect:  Appropriate  Cognitive:  Attentive Appropriate  Insight:  Developing/Improving Engaged  Engagement in Therapy:  Developing/Improving Engaged  Modes of Intervention:  Discussion Exploration Problem-Solving Supportive  Summary of Progress/Problems:  Group topic was emotional regulations.  Patient talked about having problems with anger but not acting out on feelings.    Jamie Hicks 07/24/2014 2:44 PM

## 2014-07-24 NOTE — Progress Notes (Signed)
Patient notified staff that she had had a loose stool. On inspection of the hat in toilet, sample is all liquid with no formed matter to send for testing. Reminded patient to notify staff of next BM. Room cleaned, linens changed. Patient verbalizes understanding. Jamie Kato

## 2014-07-24 NOTE — BHH Group Notes (Signed)
   [facility] LCSW Aftercare Discharge Planning Group Note  07/24/2014  8:45 AM   Participation Quality: Alert, Appropriate and Oriented  Mood/Affect: Depressed and Flat; irritable  Depression Rating: 5  Anxiety Rating: 0  Thoughts of Suicide: Pt denies SI/HI  Will you contract for safety? Yes  Current AVH: Pt denies  Plan for Discharge/Comments: Pt attended discharge planning group and actively participated in group. CSW provided pt with today's workbook. Patient reports that she continues to be interested in residential treatment. It is unclear if patient plans to discharge directly to facility or if she plans to go home first.  Transportation Means: Pt reports access to transportation  Supports: No supports mentioned at this time  Jamie Hicks, MSW, [place] Worker [facility] [phone]

## 2014-07-24 NOTE — Plan of Care (Signed)
Problem: Ineffective individual coping Goal: STG: Patient will remain free from self harm Outcome: Progressing Patient denying SI and has not engaged in self harm.  Problem: Alteration in mood & ability to function due to Goal: STG-Patient will comply with prescribed medication regimen (Patient will comply with prescribed medication regimen)  Outcome: Progressing Patient has been med compliant and is able to express needs for prn medications when appropriate.

## 2014-07-24 NOTE — Progress Notes (Signed)
Pt reports she is feeling much better, but is still having some periods of diarrhea this evening.  Pt was given imodium prn.  Pt also continues to have acid reflux, so writer spoke to PA on the unit who ordered scheduled Protonix for pt with dosing starting tonight.  Pt states she is anxious about discharge, because she knows she does not need to go back to the same situation she was in.  Pt denies SI/HI/AV at this time.  Pt makes her needs known to staff.  Support and encouragement offered.  Safety maintained with q15 minute checks.

## 2014-07-24 NOTE — BHH Group Notes (Signed)
Late entry from 07/23/14:  [facility] LCSW Group Therapy  07/23/14   1:15 PM   Type of Therapy:  Group Therapy  Participation Level:  Active  Participation Quality:  Attentive, Sharing and Supportive  Affect:  Depressed and Flat; irritable; tearful  Cognitive:  Alert and Oriented  Insight:  Developing/Improving and Engaged  Engagement in Therapy:  Developing/Improving and Engaged  Modes of Intervention:  Clarification, Confrontation, Discussion, Education, Exploration, Limit-setting, Orientation, Problem-solving, Rapport Building, Art therapist, Socialization and Support  Summary of Progress/Problems: The topic for group therapy was feelings about diagnosis.  Pt actively participated in group discussion on their past and current diagnosis and how they feel towards this.  Pt also identified how society and family members judge them, based on their diagnosis as well as stereotypes and stigmas.  Patient reported feeling "angry and ashamed" as a result of her mental illness and referred to herself as "weak" and "a loser". She also endorsed feelings of hopelessness. CSW and group engaged in discussion of separation of person from diagnoses. Patient was observed sleeping during parts of discussion. CSW and other group members provided emotional support and encouragement to patient. At the end of group patient stated "we just have to keep marching on". At the end of group patient reported feeling tired and asked to leave group.   Tilden Fossa, MSW, [place] Worker [facility] [phone]

## 2014-07-24 NOTE — Clinical Social Work Note (Signed)
CSW attempted to meet with patient individually to discuss discharge plans. Patient in bed, difficult to wake up, and would not engage in conversation at this time.  Tilden Fossa, MSW, [place] Worker [facility] [phone]

## 2014-07-24 NOTE — Clinical Social Work Note (Signed)
Late entry from 07/23/14:  CSW met with patient individually to discuss discharge plans. Patient was irritable and made poor eye contact. Patient reports an interest in residential treatment at [facility] or [facility], declined [facility] program stating that it was too far away. Patient reported that she would need to go home before treatment to pay bills and manage some financial matters. Patient did not consent to [facility] making referrals at this time but is considering residential treatment options. CSW will continue to follow to assist with discharge planning.  Tilden Fossa, MSW, [place] Worker [facility] [phone]

## 2014-07-24 NOTE — Progress Notes (Signed)
Patient quite irritable and anxious this morning both in affect and mood. Complaining of feeling unwell and reports diarrhea x approximately 48 hours. She attributes this to [place] food she ate prior to admit as well as being "backed up." Smell in room suggests possible c-diff. Spoke with NP. Order received for c-diff culture and no roommate order renewed. Hat for toilet provided along with instructions and strongly encouraged proper handwashing. Imodium prn given. Advised patient about proper diet choices until this resolves. Also put high fall risk precautions in place as patient and chart indicate history of falls PTA. Teaching reviewed. Patient verbalized understanding. Patient reports feeling better this afternoon. Knows to alert staff when loose stools occur. She denies SI/HI/AVH, no physical pain. Jamie Kato

## 2014-07-24 NOTE — Progress Notes (Signed)
Patient ID: Jamie Hicks, female   DOB: Jul 23, 1955, 59 y.o.   MRN: [id number] PER STATE REGULATIONS 482.30  THIS CHART WAS REVIEWED FOR MEDICAL NECESSITY WITH RESPECT TO THE PATIENT'S ADMISSION/ DURATION OF STAY.  NEXT REVIEW DATE: 07/28/2014  Chauncy Lean, RN, BSN CASE MANAGER

## 2014-07-24 NOTE — Progress Notes (Signed)
[facility] MD Progress Note  07/24/2014 5:06 PM Jamie Hicks  MRN:  [id number] Subjective:  Jamie Hicks continues to have issues with diarrhea. She initially thought it was because of food she ate. Now she is not sure. She is looking back at her use of "crack" and how ashame she feels about it. States she has had a lot of negative things happen to her during the years. 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 States she would still like to work few hours and have a sense of accomplishment. She has her adult children and two grand children living with her. State she pays the bills takes care of everything. She is here right now but she is thinking about them, who is going to pay bills etc. Would like to go to a residential treatment as states she will like to have more treatment so she has a better chance of being succesful Principal Problem: Substance induced mood disorder Diagnosis:   Patient Active Problem List   Diagnosis Date Noted  . Cocaine dependence with cocaine-induced mood disorder [F14.24] 07/22/2014    Priority: High  . Major depressive disorder, recurrent, severe without psychotic features [F33.2]     Priority: High  . Hypothyroidism [E03.9] 09/22/2012    Priority: High  . Substance induced mood disorder [F19.94] 07/19/2014    Priority: Medium  . Suicidal ideation [R45.851]   . Homicidal ideation [R45.850]   . Osteoarthritis [M19.90] 09/24/2012  . Prediabetes [R73.09] 09/24/2012  . Dyslipidemia [E78.5] 09/24/2012  . Vitamin D insufficiency [E55.9] 09/24/2012  . Bilateral chronic knee pain [M25.561, M25.562, G89.29] 09/22/2012  . Smoker [Z72.0] 09/22/2012  . Elevated blood pressure [R03.0] 09/22/2012   Total Time spent with patient: 30 minutes   Past Medical History:  Past Medical History  Diagnosis Date  . Back pain   . Thyroid disease     Past Surgical History  Procedure Laterality Date  . Hernia repair    . Knee arthorscopy      Family History: History reviewed. No pertinent family history. Social History:  History  Alcohol Use  . Yes    Comment: occassionally     History  Drug Use No    History   Social History  . Marital Status: Legally Separated    Spouse Name: N/A  . Number of Children: N/A  . Years of Education: N/A   Social History Main Topics  . Smoking status: Current Some Day Smoker    Types: Cigarettes  . Smokeless tobacco: Never Used  . Alcohol Use: Yes     Comment: occassionally  . Drug Use: No  . Sexual Activity: Not on file   Other Topics Concern  . None   Social History Narrative   Additional History:    Sleep: Poor  Appetite:  Poor   Assessment:   Musculoskeletal: Strength & Muscle Tone: within normal limits Gait & Station: normal Patient leans: N/A   Psychiatric Specialty Exam: Physical Exam  Review of Systems  Constitutional: Negative.   HENT: Negative.   Eyes: Negative.   Respiratory: Positive for cough.   Cardiovascular: Negative.   Gastrointestinal: Positive for diarrhea.  Genitourinary: Negative.   Musculoskeletal: Positive for joint pain.  Skin: Negative.   Neurological: Negative.   Endo/Heme/Allergies: Negative.   Psychiatric/Behavioral: Positive for depression and substance abuse. The patient is nervous/anxious and has insomnia.     Blood pressure 109/71, pulse 82, temperature 98 F (36.7 C),  temperature source Oral, resp. rate 16, height 5\' 7"  (1.702 m), weight 112.946 kg (249 lb), last menstrual period 07/21/2014.Body mass index is 38.99 kg/(m^2).  General Appearance: Fairly Groomed  Engineer, water::  Fair  Speech:  Clear and Coherent  Volume:  fluctuates  Mood:  Anxious, Depressed, Worthless and worried  Affect:  Appropriate  Thought Process:  Coherent and Goal Directed  Orientation:  Full (Time, Place, and Person)  Thought Content:  symptoms events worries concerns  Suicidal Thoughts:  No  Homicidal Thoughts:  No  Memory:  Immediate;    Fair Recent;   Fair Remote;   Fair  Judgement:  Fair  Insight:  Present  Psychomotor Activity:  Restlessness  Concentration:  Fair  Recall:  [employer] of Knowledge:Poor  Language: Fair  Akathisia:  No  Handed:  Right  AIMS (if indicated):     Assets:  Desire for Improvement  ADL's:  Intact  Cognition: WNL  Sleep:  Number of Hours: 5     Current Medications: Current Facility-Administered Medications  Medication Dose Route Frequency Provider Last Rate Last Dose  . acetaminophen (TYLENOL) tablet 650 mg  650 mg Oral Q6H PRN Delfin Gant, NP      . alum & mag hydroxide-simeth (MAALOX/MYLANTA) 200-200-20 MG/5ML suspension 30 mL  30 mL Oral Q4H PRN Delfin Gant, NP   30 mL at 07/23/14 1520  . amLODipine (NORVASC) tablet 5 mg  5 mg Oral Daily Delfin Gant, NP   5 mg at 07/24/14 0900  . hydrOXYzine (ATARAX/VISTARIL) tablet 50 mg  50 mg Oral TID PRN Delfin Gant, NP      . ibuprofen (ADVIL,MOTRIN) tablet 600 mg  600 mg Oral Q6H PRN Delfin Gant, NP   600 mg at 07/22/14 0741  . levothyroxine (SYNTHROID, LEVOTHROID) tablet 50 mcg  50 mcg Oral QAC breakfast Delfin Gant, NP   50 mcg at 07/24/14 0932  . loperamide (IMODIUM) capsule 2 mg  2 mg Oral PRN Laverle Hobby, PA-C   2 mg at 07/24/14 0900  . loratadine (CLARITIN) tablet 10 mg  10 mg Oral Daily Delfin Gant, NP   10 mg at 07/24/14 0900  . LORazepam (ATIVAN) tablet 1 mg  1 mg Oral Q6H PRN Delfin Gant, NP   1 mg at 07/20/14 2224  . magnesium hydroxide (MILK OF MAGNESIA) suspension 30 mL  30 mL Oral Daily PRN Delfin Gant, NP      . ondansetron (ZOFRAN-ODT) disintegrating tablet 4 mg  4 mg Oral Q8H PRN Laverle Hobby, PA-C   4 mg at 07/23/14 0540  . pantoprazole (PROTONIX) EC tablet 40 mg  40 mg Oral Daily Laverle Hobby, PA-C   40 mg at 07/24/14 0900  . zolpidem (AMBIEN) tablet 5 mg  5 mg Oral QHS Delfin Gant, NP   5 mg at 07/23/14 2220    Lab Results: No results found for  this or any previous visit (from the past 48 hour(s)).  Physical Findings: AIMS: Facial and Oral Movements Muscles of Facial Expression: None, normal Lips and Perioral Area: None, normal Jaw: None, normal Tongue: None, normal,Extremity Movements Upper (arms, wrists, hands, fingers): None, normal Lower (legs, knees, ankles, toes): None, normal, Trunk Movements Neck, shoulders, hips: None, normal, Overall Severity Severity of abnormal movements (highest score from questions above): None, normal Incapacitation due to abnormal movements: None, normal Patient's awareness of abnormal movements (rate only patient's report): No Awareness, Dental Status Current  problems with teeth and/or dentures?: No Does patient usually wear dentures?: No  CIWA:  CIWA-Ar Total: 3 COWS:     Treatment Plan Summary: Daily contact with patient to assess and evaluate symptoms and progress in treatment and Medication management Cocaine dependence: continue to work a relapse prevention plan Depression: discuss the use of an antidepressant Diarrhea; evaluate further Hypothyroidism: thyroid replacement  Medical Decision Making:  Review of Psycho-Social Stressors (1), Review or order clinical lab tests (1), Review of Medication Regimen & Side Effects (2) and Review of New Medication or Change in Dosage (2)     Laurencia Roma A 07/24/2014, 5:06 PM

## 2014-07-24 NOTE — BHH Suicide Risk Assessment (Signed)
[place] INPATIENT:  Family/Significant Other Suicide Prevention Education  Suicide Prevention Education:  Patient Refusal for Family/Significant Other Suicide Prevention Education: The patient Jamie Hicks has refused to provide written consent for family/significant other to be provided Family/Significant Other Suicide Prevention Education during admission and/or prior to discharge.  Physician notified. SPE reviewed with patient and brochure provided. Patient encouraged to return to hospital if having suicidal thoughts, patient verbalized his/her understanding and has no further questions at this time.   Britany Callicott, Casimiro Needle 07/24/2014, 7:51 AM

## 2014-07-25 LAB — CLOSTRIDIUM DIFFICILE BY PCR: CDIFFPCR: NEGATIVE

## 2014-07-25 NOTE — Progress Notes (Signed)
Pt up with diarrhea episode.  Pt reports she was asleep and awoke suddenly with urge to go to the bathroom.  She was not able to make to the bathroom and soiled her bed linens.  She did get up and the MHT assisted her in cleaning up herself and the bed.  She was able to go to the toilet afterwards, and RN was able to obtain a sample to be sent for testing.  Pt was given imodium at this time and reminded to let staff know each time she went to the bathroom so that she could receive imodium.  Pt voiced understanding.  Pt continues to be monitored q15 minutes for safety.

## 2014-07-25 NOTE — BHH Group Notes (Signed)
Patient attended Nurse Education group where patients were asked to set 3 goals for after discharge - taking meds, good nutrition, positive support/peer groups, etc. Jamie Hicks

## 2014-07-25 NOTE — Progress Notes (Signed)
[facility] MD Progress Note  07/25/2014 1:24 PM THAO BAUZA  MRN:  [id number] Subjective:  Jamie Hicks continues to have a hard time. She is worried about the situation at home with her family but also realizes that she has to take care of her addiction otherwise she is not going to be good for anyone. She is still dealing with a lot of shame and guilt " I should have" statements. Admits she gets stuck in this thinking pattern and then has a hard time moving on. She is dealing with a lot of anger that she states she does not want to take out on people. The diarrhea is getting better Principal Problem: Substance induced mood disorder Diagnosis:   Patient Active Problem List   Diagnosis Date Noted  . Cocaine dependence with cocaine-induced mood disorder [F14.24] 07/22/2014    Priority: High  . Major depressive disorder, recurrent, severe without psychotic features [F33.2]     Priority: High  . Hypothyroidism [E03.9] 09/22/2012    Priority: High  . Substance induced mood disorder [F19.94] 07/19/2014    Priority: Medium  . Suicidal ideation [R45.851]   . Homicidal ideation [R45.850]   . Osteoarthritis [M19.90] 09/24/2012  . Prediabetes [R73.09] 09/24/2012  . Dyslipidemia [E78.5] 09/24/2012  . Vitamin D insufficiency [E55.9] 09/24/2012  . Bilateral chronic knee pain [M25.561, M25.562, G89.29] 09/22/2012  . Smoker [Z72.0] 09/22/2012  . Elevated blood pressure [R03.0] 09/22/2012   Total Time spent with patient: 30 minutes   Past Medical History:  Past Medical History  Diagnosis Date  . Back pain   . Thyroid disease     Past Surgical History  Procedure Laterality Date  . Hernia repair    . Knee arthorscopy     Family History: History reviewed. No pertinent family history. Social History:  History  Alcohol Use  . Yes    Comment: occassionally     History  Drug Use No    History   Social History  . Marital Status: Legally Separated    Spouse Name: N/A  . Number of Children:  N/A  . Years of Education: N/A   Social History Main Topics  . Smoking status: Current Some Day Smoker    Types: Cigarettes  . Smokeless tobacco: Never Used  . Alcohol Use: Yes     Comment: occassionally  . Drug Use: No  . Sexual Activity: Not on file   Other Topics Concern  . None   Social History Narrative   Additional History:    Sleep: Poor  Appetite:  Poor   Assessment:   Musculoskeletal: Strength & Muscle Tone: within normal limits Gait & Station: normal Patient leans: N/A   Psychiatric Specialty Exam: Physical Exam  Review of Systems  Constitutional: Negative.   HENT: Negative.   Eyes: Negative.   Respiratory: Negative.   Cardiovascular: Negative.   Gastrointestinal: Positive for diarrhea.  Genitourinary: Negative.   Musculoskeletal: Negative.   Skin: Negative.   Neurological: Negative.   Endo/Heme/Allergies: Negative.   Psychiatric/Behavioral: Positive for depression and substance abuse. The patient is nervous/anxious.     Blood pressure 116/63, pulse 78, temperature 98.6 F (37 C), temperature source Oral, resp. rate 18, height 5\' 7"  (1.702 m), weight 112.946 kg (249 lb), last menstrual period 07/21/2014.Body mass index is 38.99 kg/(m^2).  General Appearance: Disheveled  Eye Sport and exercise psychologist::  Fair  Speech:  Clear and Coherent  Volume:  fluctuates  Mood:  Anxious, Depressed and Irritable  Affect:  Labile  Thought Process:  Coherent and  Goal Directed  Orientation:  Full (Time, Place, and Person)  Thought Content:  sympotms events worries concerns  Suicidal Thoughts:  Intermittent when she gets in the negative thinking pattern  Homicidal Thoughts:  No  Memory:  Immediate;   Fair Recent;   Fair Remote;   Fair  Judgement:  Fair  Insight:  Present  Psychomotor Activity:  Restlessness  Concentration:  Fair  Recall:  [employer] of Knowledge:Fair  Language: Fair  Akathisia:  No  Handed:  Right  AIMS (if indicated):     Assets:  Desire for  Improvement Housing  ADL's:  Intact  Cognition: WNL  Sleep:  Number of Hours: 5     Current Medications: Current Facility-Administered Medications  Medication Dose Route Frequency Provider Last Rate Last Dose  . acetaminophen (TYLENOL) tablet 650 mg  650 mg Oral Q6H PRN Delfin Gant, NP      . alum & mag hydroxide-simeth (MAALOX/MYLANTA) 200-200-20 MG/5ML suspension 30 mL  30 mL Oral Q4H PRN Delfin Gant, NP   30 mL at 07/23/14 1520  . amLODipine (NORVASC) tablet 5 mg  5 mg Oral Daily Delfin Gant, NP   5 mg at 07/25/14 0813  . hydrOXYzine (ATARAX/VISTARIL) tablet 50 mg  50 mg Oral TID PRN Delfin Gant, NP      . ibuprofen (ADVIL,MOTRIN) tablet 600 mg  600 mg Oral Q6H PRN Delfin Gant, NP   600 mg at 07/25/14 0813  . levothyroxine (SYNTHROID, LEVOTHROID) tablet 50 mcg  50 mcg Oral QAC breakfast Delfin Gant, NP   50 mcg at 07/25/14 0649  . loperamide (IMODIUM) capsule 2 mg  2 mg Oral PRN Laverle Hobby, PA-C   2 mg at 07/25/14 1212  . loratadine (CLARITIN) tablet 10 mg  10 mg Oral Daily Delfin Gant, NP   10 mg at 07/25/14 0813  . LORazepam (ATIVAN) tablet 1 mg  1 mg Oral Q6H PRN Delfin Gant, NP   1 mg at 07/20/14 2224  . magnesium hydroxide (MILK OF MAGNESIA) suspension 30 mL  30 mL Oral Daily PRN Delfin Gant, NP      . nicotine (NICODERM CQ - dosed in mg/24 hours) patch 14 mg  14 mg Transdermal Daily Laverle Hobby, PA-C   14 mg at 07/25/14 9833  . ondansetron (ZOFRAN-ODT) disintegrating tablet 4 mg  4 mg Oral Q8H PRN Laverle Hobby, PA-C   4 mg at 07/23/14 0540  . pantoprazole (PROTONIX) EC tablet 40 mg  40 mg Oral Daily Laverle Hobby, PA-C   40 mg at 07/25/14 0813  . zolpidem (AMBIEN) tablet 5 mg  5 mg Oral QHS Delfin Gant, NP   5 mg at 07/24/14 2200    Lab Results: No results found for this or any previous visit (from the past 48 hour(s)).  Physical Findings: AIMS: Facial and Oral Movements Muscles of Facial  Expression: None, normal Lips and Perioral Area: None, normal Jaw: None, normal Tongue: None, normal,Extremity Movements Upper (arms, wrists, hands, fingers): None, normal Lower (legs, knees, ankles, toes): None, normal, Trunk Movements Neck, shoulders, hips: None, normal, Overall Severity Severity of abnormal movements (highest score from questions above): None, normal Incapacitation due to abnormal movements: None, normal Patient's awareness of abnormal movements (rate only patient's report): No Awareness, Dental Status Current problems with teeth and/or dentures?: No Does patient usually wear dentures?: No  CIWA:  CIWA-Ar Total: 3 COWS:     Treatment Plan  Summary: Daily contact with patient to assess and evaluate symptoms and progress in treatment and Medication management Supportive approach/copign skills/relapse prevention Mood Instability: reassess for need of a psychotropic agent ( she has been hesitant to take medications due to past experiences and side effects and the thought that  her mood fluctuation is secondary to her use of cocaine and that when she quits the cocaine her mood will get better) Insomnia: will increase the Ambien to 10 mg HS PRN sleep Negative self perceptions: use CBT and challenge her cognitive errors and switch to more accurate ways of perceiving her situation Medical Decision Making:  Review of Psycho-Social Stressors (1), Review of Medication Regimen & Side Effects (2) and Review of New Medication or Change in Dosage (2)     Shalie Schremp A 07/25/2014, 1:24 PM

## 2014-07-25 NOTE — Progress Notes (Signed)
Met with patient this morning who continues to report feeling weak. Reports bout of diarrhea overnight and again this morning. Sample picked up by lab this morning and it will be tested for c-diff. Patient is flat, depressed and irritable. Patient offered support, reassurance. Educated about need for proper handwashing. Also spoke about the [employer] and proper choices given her diarrhea. Fall precautions reviewed as well. Patient is high fall risk due to hx of falls PTA. Patient given medications per orders. Loperamide given along with ibuprofen for chronic back pain of an 8/10. Will reassess at 1 hour. Patient verbalizes understanding regarding infection prevention as well as ways to prevent falls. She denies SI/HI/AVH and remains safe. Jamie Hicks

## 2014-07-25 NOTE — Clinical Social Work Note (Signed)
Per patient request, CSW attempted to contact [facility] social worker Manus Gunning, [phone]. CSW left voicemail for case International aid/development worker, awaiting return call.  Tilden Fossa, MSW, [place] Worker [facility] [phone]

## 2014-07-25 NOTE — Plan of Care (Signed)
Problem: Alteration in mood Goal: STG-Patient reports thoughts of self-harm to staff Outcome: Progressing Patient denies SI.  Problem: Alteration in mood & ability to function due to Goal: STG: Patient verbalizes decreases in signs of withdrawal Outcome: Progressing Patient denying any withdrawal.

## 2014-07-25 NOTE — BHH Group Notes (Signed)
[place] LCSW Group Therapy 07/25/2014 1:15 PM Type of Therapy: Group Therapy Participation Level: Active  Participation Quality: Attentive, Sharing and Supportive  Affect: Depressed and Flat  Cognitive: Alert and Oriented  Insight: Developing/Improving and Engaged  Engagement in Therapy: Developing/Improving and Engaged  Modes of Intervention: Activity, Clarification, Confrontation, Discussion, Education, Exploration, Limit-setting, Orientation, Problem-solving, Rapport Building, Art therapist, Socialization and Support  Summary of Progress/Problems: Patient was attentive and engaged with speaker from [place]. Patient was attentive to speaker while they shared their story of dealing with mental health and overcoming it. Patient expressed interest in their programs and services and received information on their agency. Patient processed ways they can relate to the speaker. Patient actively listened during group and discussed her experiences with drugs and alcohol after presentation. She shared that she experiences feelings of anger, shame, and guilt related to her substance use.   Tilden Fossa, MSW, [place] Worker [facility] [phone]

## 2014-07-25 NOTE — Progress Notes (Signed)
Pt did not attend group this evening.  

## 2014-07-25 NOTE — BHH Suicide Risk Assessment (Signed)
[place] INPATIENT:  Family/Significant Other Suicide Prevention Education  Suicide Prevention Education:  Education Completed; daughter Gasper Lloyd [phone],  (name of family member/significant other) has been identified by the patient as the family member/significant other with whom the patient will be residing, and identified as the person(s) who will aid the patient in the event of a mental health crisis (suicidal ideations/suicide attempt).  With written consent from the patient, the family member/significant other has been provided the following suicide prevention education, prior to the and/or following the discharge of the patient.  The suicide prevention education provided includes the following:  Suicide risk factors  Suicide prevention and interventions  National Suicide Hotline telephone number  [facility] assessment telephone number  [facility] Emergency Assistance [place] and/or Residential Mobile Crisis Unit telephone number  Request made of family/significant other to:  Remove weapons (e.g., guns, rifles, knives), all items previously/currently identified as safety concern.    Remove drugs/medications (over-the-counter, prescriptions, illicit drugs), all items previously/currently identified as a safety concern.  The family member/significant other verbalizes understanding of the suicide prevention education information provided.  The family member/significant other agrees to remove the items of safety concern listed above.  Donielle Kaigler, Casimiro Needle 07/25/2014, 4:48 PM

## 2014-07-25 NOTE — Progress Notes (Signed)
Pt has been in her room all evening.  At the beginning of the shift, pt reported that she had not had any more episodes of diarrhea since lunch time.  Pt was given gatorade at this time per request.  Pt was encouraged to inform staff if it returned and also reminded that we needed a sample to send for testing.  Pt voiced understanding.  Pt denies SI/HI/AV at this time.  She is frustrated that she is not feeling better because she wants to discharge to take care of obligations at home.  Pt encouraged to discuss her concerns with the MD in the AM.  Support and encouragement offered.  Safety maintained with q15 minute checks.

## 2014-07-26 NOTE — Progress Notes (Signed)
Patient ID: Jamie Hicks, female   DOB: 1955-06-23, 59 y.o.   MRN: [id number] D: Patient alert and cooperative with assessment. Pt reports her day was a lot better because her diarrhea has subsided and she had a visit from an old friend. Pt is pleasant interacting with staff and peers in the dayroom. Pt denies SI/HI/AVH and pain. Pt attended evening wrap up group and engaged in discussion.   A: Met with pt 1:1. Medications administered as prescribed. Writer encouraged pt to discuss feelings. Pt encouraged to come to staff with any question or concerns.   R: Patient remains safe. She is complaint with medications and denies any adverse reaction.

## 2014-07-26 NOTE — BHH Group Notes (Signed)
[facility] LCSW Aftercare Discharge Planning Group Note  07/26/2014  8:45 AM  Participation Quality: Did Not Attend. Patient invited to participate but declined.  Tilden Fossa, MSW, [place] Worker [facility] [phone]

## 2014-07-26 NOTE — Progress Notes (Signed)
D) Pt has been attending the program and interacting with her peers from the [facility] where she started out. States, "I have a friend over there and that was all I needed. A friend to talk to and for you all to take care of me so that I can get back to myself and take my medications as I am suppose to".  "I also am continuing to have diarrhea and it's making me feel scared. It was the [place] food that got me". Rates her depression at a 0, hopelessness at a 0 and her anxiety at a 6. A) Given support, reassurance and praise. Encouragement given. Provided with a 1:1. R) Denies SI and HI, delusions and hallucinations.

## 2014-07-26 NOTE — Tx Team (Signed)
Interdisciplinary Treatment Plan Update (Adult) Date: 07/26/2014   Time Reviewed: 9:30 AM  Progress in Treatment: Attending groups: Yes Participating in groups: Yes Taking medication as prescribed: Yes Tolerating medication: Yes Family/Significant other contact made: Yes, CSW has spoken with patient's daughter Cyra Patient understands diagnosis: Yes Discussing patient identified problems/goals with staff: Yes Medical problems stabilized or resolved: Yes Denies suicidal/homicidal ideation: Yes Issues/concerns per patient self-inventory: Yes Other:  New problem(s) identified: N/A  Discharge Plan or Barriers:   2/9: CSW continuing to assess. Per treatment team, patient is requesting long term residential treatment at discharge.   2/12: Patient continuing to endorse feelings of hopelessness and depression. Patient plans to discharge home to follow up with screening at [facility] on Thursday 2/18.  Reason for Continuation of Hospitalization:  Depression Anxiety Medication Stabilization   Comments: N/A  Estimated length of stay: Discharge anticipated for Monday 2/15.  For review of initial/current patient goals, please see plan of care.  Patient is a 59 year old female admitted following SI and polysubstance abuse. Patient lives in [place] with several family members. Patient will benefit from crisis stabilization, medication evaluation, group therapy, and psycho education in addition to case management for discharge planning. Patient and CSW reviewed pt's identified goals and treatment plan. Pt verbalized understanding and agreed to treatment plan.   Attendees:  Patient:  07/26/2014 9:30 AM   Signature: Gabriel Earing, MD 07/26/2014 9:30 AM   Signature: Carlton Adam, MD 07/26/2014 9:30 AM   Signature: Loletta Specter, Pearson Grippe RN 07/26/2014 9:30 AM   Signature: 07/26/2014 9:30 AM   Signature: Nira Conn Smart, LCSWA 07/26/2014 9:30 AM    Signature: Joette Catching, LCSW 07/26/2014 9:30 AM   Signature: Tilden Fossa, LCSWA 07/26/2014 9:30 AM   Signature: Lucinda Dell, Care Coordinator [facility] 07/26/2014 9:30 AM   Signature:    Signature:    Signature: Lars Pinks, RN [facility] 07/26/2014 9:30 AM   Signature:          Scribe for Treatment Team:  Tilden Fossa, MSW, [place] [phone]

## 2014-07-26 NOTE — BHH Group Notes (Signed)
[facility] LCSW Group Therapy 07/26/2014 1:15 PM Type of Therapy: Group Therapy Participation Level: Active  Participation Quality: Attentive, Sharing and Supportive  Affect: Appropriate  Cognitive: Alert and Oriented  Insight: Developing/Improving and Engaged  Engagement in Therapy: Developing/Improving and Engaged  Modes of Intervention: Clarification, Confrontation, Discussion, Education, Exploration, Limit-setting, Orientation, Problem-solving, Rapport Building, Art therapist, Socialization and Support  Summary of Progress/Problems: The topic for today was feelings about relapse. Pt discussed what relapse prevention is to them and identified triggers that they are on the path to relapse. Pt processed their feeling towards relapse and was able to relate to peers. Pt discussed coping skills that can be used for relapse prevention. Patient discussed her desire to change her lifestyle and get into a program of recovery. Patient identified her family as a priority to her and discussed her need to distance herself from unhealthy relationships and engage in counseling. Patient provided another group member with emotional support and encouragement.   Tilden Fossa, MSW, [place] Worker [facility] [phone]

## 2014-07-26 NOTE — Progress Notes (Signed)
Pt attended group this evening. 

## 2014-07-26 NOTE — Progress Notes (Signed)
[facility] MD Progress Note  07/26/2014 1:33 PM Jamie Hicks  MRN:  [id number] Subjective:  Jamie Hicks states that an old friend from church was admitted here. State she was happy to see him and to know he admitted that he needed help. She has been seen irritable and short with staff and patients alike. She is still dealing with making decisions in terms of what to do when she gets out of here. She knows she is needed at home but also knows she has to take care of her addiction. She is still having on going worries and concerns, states she is upset with herself for not taking care of her thyroid as well as for relapsing. Admits she keeps going back to feeling the shame and the guilt and when she goes there her mood goes down on her. She wants to get to a point in which she can just move on. She has an appointment with [facility] Thursday for admission to their program. States she is not going to let anything keep her from not going. She still feels some more time for full detox and to have her mood more stable. Still struggling with the diarrhea but seems to be getting better Principal Problem: Substance induced mood disorder Diagnosis:   Patient Active Problem List   Diagnosis Date Noted  . Cocaine dependence with cocaine-induced mood disorder [F14.24] 07/22/2014    Priority: High  . Major depressive disorder, recurrent, severe without psychotic features [F33.2]     Priority: High  . Hypothyroidism [E03.9] 09/22/2012    Priority: High  . Substance induced mood disorder [F19.94] 07/19/2014    Priority: Medium  . Suicidal ideation [R45.851]   . Homicidal ideation [R45.850]   . Osteoarthritis [M19.90] 09/24/2012  . Prediabetes [R73.09] 09/24/2012  . Dyslipidemia [E78.5] 09/24/2012  . Vitamin D insufficiency [E55.9] 09/24/2012  . Bilateral chronic knee pain [M25.561, M25.562, G89.29] 09/22/2012  . Smoker [Z72.0] 09/22/2012  . Elevated blood pressure [R03.0] 09/22/2012   Total Time spent with  patient: 30 minutes   Past Medical History:  Past Medical History  Diagnosis Date  . Back pain   . Thyroid disease     Past Surgical History  Procedure Laterality Date  . Hernia repair    . Knee arthorscopy     Family History: History reviewed. No pertinent family history. Social History:  History  Alcohol Use  . Yes    Comment: occassionally     History  Drug Use No    History   Social History  . Marital Status: Legally Separated    Spouse Name: N/A  . Number of Children: N/A  . Years of Education: N/A   Social History Main Topics  . Smoking status: Current Some Day Smoker    Types: Cigarettes  . Smokeless tobacco: Never Used  . Alcohol Use: Yes     Comment: occassionally  . Drug Use: No  . Sexual Activity: Not on file   Other Topics Concern  . None   Social History Narrative   Additional History:    Sleep: Fair  Appetite:  Fair   Assessment:   Musculoskeletal: Strength & Muscle Tone: within normal limits Gait & Station: normal Patient leans: N/A   Psychiatric Specialty Exam: Physical Exam  Review of Systems  Constitutional: Negative.   HENT: Negative.   Eyes: Negative.   Respiratory: Negative.   Cardiovascular: Negative.   Gastrointestinal: Positive for diarrhea.  Genitourinary: Negative.   Musculoskeletal: Positive for joint pain.  Skin:  Negative.   Neurological: Negative.   Endo/Heme/Allergies: Negative.   Psychiatric/Behavioral: Positive for depression and substance abuse. The patient is nervous/anxious.     Blood pressure 138/79, pulse 75, temperature 98 F (36.7 C), temperature source Oral, resp. rate 20, height 5\' 7"  (1.702 m), weight 112.946 kg (249 lb), last menstrual period 07/21/2014.Body mass index is 38.99 kg/(m^2).  General Appearance: Fairly Groomed  Engineer, water::  Fair  Speech:  Clear and Coherent  Volume:  Increased  Mood:  Anxious and worried but "happpy" she saw her friend here  Affect:  Labile  Thought Process:   Coherent and Goal Directed  Orientation:  Full (Time, Place, and Person)  Thought Content:  symptoms events worries concerns  Suicidal Thoughts:  No  Homicidal Thoughts:  No  Memory:  Immediate;   Fair Recent;   Fair Remote;   Fair  Judgement:  Fair  Insight:  Present and Shallow  Psychomotor Activity:  Restlessness  Concentration:  Fair  Recall:  [employer] of Knowledge:Fair  Language: Fair  Akathisia:  No  Handed:  Right  AIMS (if indicated):     Assets:  Desire for Improvement Housing  ADL's:  Intact  Cognition: WNL  Sleep:  Number of Hours: 6.25     Current Medications: Current Facility-Administered Medications  Medication Dose Route Frequency Provider Last Rate Last Dose  . acetaminophen (TYLENOL) tablet 650 mg  650 mg Oral Q6H PRN Delfin Gant, NP      . alum & mag hydroxide-simeth (MAALOX/MYLANTA) 200-200-20 MG/5ML suspension 30 mL  30 mL Oral Q4H PRN Delfin Gant, NP   30 mL at 07/25/14 1717  . amLODipine (NORVASC) tablet 5 mg  5 mg Oral Daily Delfin Gant, NP   5 mg at 07/26/14 0825  . hydrOXYzine (ATARAX/VISTARIL) tablet 50 mg  50 mg Oral TID PRN Delfin Gant, NP      . ibuprofen (ADVIL,MOTRIN) tablet 600 mg  600 mg Oral Q6H PRN Delfin Gant, NP   600 mg at 07/26/14 4098  . levothyroxine (SYNTHROID, LEVOTHROID) tablet 50 mcg  50 mcg Oral QAC breakfast Delfin Gant, NP   50 mcg at 07/26/14 [phone]  . loperamide (IMODIUM) capsule 2 mg  2 mg Oral PRN Laverle Hobby, PA-C   2 mg at 07/26/14 4782  . loratadine (CLARITIN) tablet 10 mg  10 mg Oral Daily Delfin Gant, NP   10 mg at 07/26/14 0825  . LORazepam (ATIVAN) tablet 1 mg  1 mg Oral Q6H PRN Delfin Gant, NP   1 mg at 07/20/14 2224  . magnesium hydroxide (MILK OF MAGNESIA) suspension 30 mL  30 mL Oral Daily PRN Delfin Gant, NP      . nicotine (NICODERM CQ - dosed in mg/24 hours) patch 14 mg  14 mg Transdermal Daily Laverle Hobby, PA-C   14 mg at 07/26/14 0825  .  ondansetron (ZOFRAN-ODT) disintegrating tablet 4 mg  4 mg Oral Q8H PRN Laverle Hobby, PA-C   4 mg at 07/23/14 0540  . pantoprazole (PROTONIX) EC tablet 40 mg  40 mg Oral Daily Laverle Hobby, PA-C   40 mg at 07/26/14 0825  . zolpidem (AMBIEN) tablet 5 mg  5 mg Oral QHS Delfin Gant, NP   5 mg at 07/24/14 2200    Lab Results:  Results for orders placed or performed during the hospital encounter of 07/20/14 (from the past 48 hour(s))  Clostridium Difficile by PCR  Status: None   Collection Time: 07/25/14  7:33 AM  Result Value Ref Range   C difficile by pcr NEGATIVE NEGATIVE    Comment: Performed at [facility]    Physical Findings: AIMS: Facial and Oral Movements Muscles of Facial Expression: None, normal Lips and Perioral Area: None, normal Jaw: None, normal Tongue: None, normal,Extremity Movements Upper (arms, wrists, hands, fingers): None, normal Lower (legs, knees, ankles, toes): None, normal, Trunk Movements Neck, shoulders, hips: None, normal, Overall Severity Severity of abnormal movements (highest score from questions above): None, normal Incapacitation due to abnormal movements: None, normal Patient's awareness of abnormal movements (rate only patient's report): No Awareness, Dental Status Current problems with teeth and/or dentures?: No Does patient usually wear dentures?: No  CIWA:  CIWA-Ar Total: 3 COWS:     Treatment Plan Summary: Daily contact with patient to assess and evaluate symptoms and progress in treatment and Medication management Supportive approach/coping skills Will continue to work the relapse prevention plan She still doesn't want to try any psychotropics based on bad experiences in her past. She is planning to be there at [facility] on Thursday. Would like to be D/C few days before so she can take care of things at home. States that right now money is the main source of stress for her family She is still evidencing mood instability. Will  continue to monitor Medical Decision Making:  Review of Psycho-Social Stressors (1) and Review of Medication Regimen & Side Effects (2)     Stormee Duda A 07/26/2014, 1:33 PM

## 2014-07-26 NOTE — Plan of Care (Signed)
Problem: Alteration in mood Goal: STG-Patient is able to discuss feelings and issues (Patient is able to discuss feelings and issues leading to depression)  Outcome: Progressing Pt is safe and able to discuss issues with family.

## 2014-07-26 NOTE — Progress Notes (Signed)
Patient ID: Jamie Hicks, female   DOB: 06-Aug-1955, 59 y.o.   MRN: [id number] D: Patient reports increase diarrhea throughout the day. Pt stated she think it is from [place] food she ate. Pt denies SI/HI/AVH and pain. Cooperative with assessment. No acute distressed noted at this time.   A: Met with pt 1:1. Medications administered as prescribed. Writer encouraged to increase fluid intake. Pt encouraged to come to staff with any question or concerns.   R: Patient remains safe in room most of the evening. She is complaint with medications and denies any adverse reaction.

## 2014-07-27 NOTE — B[facility] Group Notes (Signed)
[facility] Group Notes:  (Clinical Social Work)  07/27/2014     9:45-10:45AM  Summary of Progress/Problems:   The main focus of today's process group was to explore current issues affecting patient's life adversely, and to discuss desired change and potential barriers to that change.   Motivational Interviewing techniques were utilized to evoke change talk and to explore patient's confidence in their ability to follow through on that desired change (using scaling question of 1 for no confidence to 10 for complete confidence).   The patient expressed what she wants to change in her life is to become more stable, have a better living situation, and follow through on things in her life that she starts, instead of using anger.  Her confidence is at a 5, and to increase her confidence in her ability to achieve this goal, she feels she needs further treatment after this hospitalization, preferably a rehabilitation program.  Type of Therapy:  Group Therapy - Process using Motivational Interviewing  Participation Level:  Active  Participation Quality:  Attentive, Sharing and Supportive  Affect:  Appropriate  Cognitive:  Appropriate and Oriented  Insight:  Engaged  Engagement in Therapy:  Engaged  Modes of Intervention:  Education, Motivational Interviewing  Selmer Dominion, LCSW 07/27/2014, 12:19 PM

## 2014-07-27 NOTE — Progress Notes (Signed)
Patient ID: Jamie Hicks, female   DOB: Nov 21, 1955, 59 y.o.   MRN: [id number] [facility] MD Progress Note  07/27/2014 4:42 PM Jamie Hicks  MRN:  [id number]  Subjective:  Jamie Hicks states that she is feeling a lot better today than when she was first admitted. She states that she was a lush. She says she is looking forward to going to [facility] on Monday for further substance abuse treatment. She adds that she is sleeping well. Currently denies any new issues. No medication side effects reported. Is in no apparent distress.  Principal Problem: Substance induced mood disorder Diagnosis:   Patient Active Problem List   Diagnosis Date Noted  . Cocaine dependence with cocaine-induced mood disorder [F14.24] 07/22/2014  . Major depressive disorder, recurrent, severe without psychotic features [F33.2]   . Substance induced mood disorder [F19.94] 07/19/2014  . Suicidal ideation [R45.851]   . Homicidal ideation [R45.850]   . Osteoarthritis [M19.90] 09/24/2012  . Prediabetes [R73.09] 09/24/2012  . Dyslipidemia [E78.5] 09/24/2012  . Vitamin D insufficiency [E55.9] 09/24/2012  . Hypothyroidism [E03.9] 09/22/2012  . Bilateral chronic knee pain [M25.561, M25.562, G89.29] 09/22/2012  . Smoker [Z72.0] 09/22/2012  . Elevated blood pressure [R03.0] 09/22/2012   Total Time spent with patient: 30 minutes   Past Medical History:  Past Medical History  Diagnosis Date  . Back pain   . Thyroid disease     Past Surgical History  Procedure Laterality Date  . Hernia repair    . Knee arthorscopy     Family History: History reviewed. No pertinent family history. Social History:  History  Alcohol Use  . Yes    Comment: occassionally     History  Drug Use No    History   Social History  . Marital Status: Legally Separated    Spouse Name: N/A  . Number of Children: N/A  . Years of Education: N/A   Social History Main Topics  . Smoking status: Current Some Day Smoker    Types:  Cigarettes  . Smokeless tobacco: Never Used  . Alcohol Use: Yes     Comment: occassionally  . Drug Use: No  . Sexual Activity: Not on file   Other Topics Concern  . None   Social History Narrative   Additional History:    Sleep: Fair  Appetite:  Fair  Assessment:   Musculoskeletal: Strength & Muscle Tone: within normal limits Gait & Station: normal Patient leans: N/A  Psychiatric Specialty Exam: Physical Exam  ROS  Blood pressure 128/58, pulse 79, temperature 98.9 F (37.2 C), temperature source Oral, resp. rate 18, height 5\' 7"  (1.702 m), weight 112.946 kg (249 lb), last menstrual period 07/21/2014.Body mass index is 38.99 kg/(m^2).  General Appearance: Fairly Groomed  Engineer, water::  Fair  Speech:  Clear and Coherent  Volume:  Increased  Mood:  Anxious and worried but "happpy" she saw her friend here  Affect:  Labile  Thought Process:  Coherent and Goal Directed  Orientation:  Full (Time, Place, and Person)  Thought Content:  symptoms events worries concerns  Suicidal Thoughts:  No  Homicidal Thoughts:  No  Memory:  Immediate;   Fair Recent;   Fair Remote;   Fair  Judgement:  Fair  Insight:  Present and Shallow  Psychomotor Activity:  Restlessness due to nicotine withdrawal.  Concentration:  Fair  Recall:  [place]  Language: Fair  Akathisia:  No  Handed:  Right  AIMS (if indicated):  Assets:  Desire for Improvement Housing  ADL's:  Intact  Cognition: WNL  Sleep:  Number of Hours: 6   Current Medications: Current Facility-Administered Medications  Medication Dose Route Frequency Provider Last Rate Last Dose  . acetaminophen (TYLENOL) tablet 650 mg  650 mg Oral Q6H PRN Delfin Gant, NP      . alum & mag hydroxide-simeth (MAALOX/MYLANTA) 200-200-20 MG/5ML suspension 30 mL  30 mL Oral Q4H PRN Delfin Gant, NP   30 mL at 07/25/14 1717  . amLODipine (NORVASC) tablet 5 mg  5 mg Oral Daily Delfin Gant, NP   5 mg at  07/27/14 1191  . hydrOXYzine (ATARAX/VISTARIL) tablet 50 mg  50 mg Oral TID PRN Delfin Gant, NP      . ibuprofen (ADVIL,MOTRIN) tablet 600 mg  600 mg Oral Q6H PRN Delfin Gant, NP   600 mg at 07/27/14 1455  . levothyroxine (SYNTHROID, LEVOTHROID) tablet 50 mcg  50 mcg Oral QAC breakfast Delfin Gant, NP   50 mcg at 07/27/14 0657  . loperamide (IMODIUM) capsule 2 mg  2 mg Oral PRN Laverle Hobby, PA-C   2 mg at 07/26/14 1535  . loratadine (CLARITIN) tablet 10 mg  10 mg Oral Daily Delfin Gant, NP   10 mg at 07/27/14 0814  . LORazepam (ATIVAN) tablet 1 mg  1 mg Oral Q6H PRN Delfin Gant, NP   1 mg at 07/20/14 2224  . magnesium hydroxide (MILK OF MAGNESIA) suspension 30 mL  30 mL Oral Daily PRN Delfin Gant, NP      . nicotine (NICODERM CQ - dosed in mg/24 hours) patch 14 mg  14 mg Transdermal Daily Laverle Hobby, PA-C   14 mg at 07/27/14 0813  . ondansetron (ZOFRAN-ODT) disintegrating tablet 4 mg  4 mg Oral Q8H PRN Laverle Hobby, PA-C   4 mg at 07/23/14 0540  . pantoprazole (PROTONIX) EC tablet 40 mg  40 mg Oral Daily Laverle Hobby, PA-C   40 mg at 07/27/14 4782  . zolpidem (AMBIEN) tablet 5 mg  5 mg Oral QHS Delfin Gant, NP   5 mg at 07/26/14 2309    Lab Results:  No results found for this or any previous visit (from the past 48 hour(s)).  Physical Findings: AIMS: Facial and Oral Movements Muscles of Facial Expression: None, normal Lips and Perioral Area: None, normal Jaw: None, normal Tongue: None, normal,Extremity Movements Upper (arms, wrists, hands, fingers): None, normal Lower (legs, knees, ankles, toes): None, normal, Trunk Movements Neck, shoulders, hips: None, normal, Overall Severity Severity of abnormal movements (highest score from questions above): None, normal Incapacitation due to abnormal movements: None, normal Patient's awareness of abnormal movements (rate only patient's report): No Awareness, Dental Status Current  problems with teeth and/or dentures?: No Does patient usually wear dentures?: No  CIWA:  CIWA-Ar Total: 3 COWS:     Treatment Plan Summary: Daily contact with patient to assess and evaluate symptoms and progress in treatment and Medication management 1. Continue crisis management, mood stabilization & relapse prevention.. 2. Continue current medication management to reduce current symptoms to base line and improve the  patient's overall level of functioning; Ambien 5 mg Q bedtime for sleep, Lorazepam 1 mg for anxiety 3. Treat health problems as indicated; continue Norvasc 5 mg for HTN, Synthroid 50 mcg for hypothyroidism, Protonix 40 mg for acid reflux. 4. Develop treatment plan to enhance medication adeherance upon discharge and the need for  readmission. 5. Psycho-social education regarding relapse prevention and self care.  Medical Decision Making:  Review of Psycho-Social Stressors (1) and Review of Medication Regimen & Side Effects (2)  Encarnacion Slates, PMHNP, FNP-BC 07/27/2014, 4:42 PM

## 2014-07-27 NOTE — Progress Notes (Signed)
D) Pt has been attending the program over on the [facility]. States," I have friends over there and I really feel good about attending the classes over there". Pt rates her depression, hopelessness and anxiety all at a 0. States she is really feeling much better and is happy with herself. Pt has also started reaching out to others and wrote a small poem of encouragement and had several copies made so "I can given it to others". A) Given support, reassurance and praise along with encouragement. Provided with a 1:1. Praised for her positive outlook on life and her willingness to invest in the program R) Pt denies SI and HI.

## 2014-07-27 NOTE — Plan of Care (Signed)
Problem: Alteration in mood & ability to function due to Goal: STG-Patient will attend groups Outcome: Progressing Pt attended evening wrap up group

## 2014-07-28 NOTE — Progress Notes (Signed)
D: Pt's mood is pleasant and eye contact is fair. She states that she had a good day and rates her depression low. Pt interacts well with her peers.  A: Support given. Verbalization encouraged. Pt encouraged to come to nurse with any concerns. Medications given as prescribed.  R: Pt is receptive. No complaints of pain or discomfort at this time. Q15 min safety checks maintained. Will continue to monitor pt.

## 2014-07-28 NOTE — Progress Notes (Signed)
Patient ID: Jamie Hicks, female   DOB: 08-May-1956, 59 y.o.   MRN: [id number]  Adult Psychoeducational Group Note  Date: 07/28/2014 Time: 03:15pm  Group Topic/Focus:  Personal Choices and Values: The focus of this group is to help patients assess and explore the importance of values in their lives, how their values affect their decisions, how they express their values and what opposes their expression.  Participation Level: Active  Participation Quality: Appropriate, Supportive and Attentive  Affect: Flat  Cognitive: Alert and Appropriate  Insight: Appropriate  Engagement in Group: Improving  Modes of Intervention: Discussion, Education, Reality Testing and Support  Additional Comments: Pt active and engaged in group. Pt was able to identify multiple positive personal values.   Elenore Rota 07/28/2014, 4:14 PM

## 2014-07-28 NOTE — Progress Notes (Signed)
Patient ID: Jamie Hicks, female   DOB: 1956-05-17, 59 y.o.   MRN: [id number] Patient ID: Jamie Hicks, female   DOB: Dec 16, 1955, 59 y.o.   MRN: [id number] [facility] MD Progress Note  07/28/2014 2:16 PM Jamie Hicks  MRN:  [id number]  Subjective:  Jamie Hicks says she is feeling very well. Is currently getting her mind together to get discharged in am. Would first go home to take care of some business, get some bills paid then head down to [facility]. She states she has plans to get herself into good routine such as exercise to keep her mind together. [name] says from here on, she will no longer 'isolate herself' from others, rather will "insolate herself". She denies any new issues.  Principal Problem: Substance induced mood disorder Diagnosis:   Patient Active Problem List   Diagnosis Date Noted  . Cocaine dependence with cocaine-induced mood disorder [F14.24] 07/22/2014  . Major depressive disorder, recurrent, severe without psychotic features [F33.2]   . Substance induced mood disorder [F19.94] 07/19/2014  . Suicidal ideation [R45.851]   . Homicidal ideation [R45.850]   . Osteoarthritis [M19.90] 09/24/2012  . Prediabetes [R73.09] 09/24/2012  . Dyslipidemia [E78.5] 09/24/2012  . Vitamin D insufficiency [E55.9] 09/24/2012  . Hypothyroidism [E03.9] 09/22/2012  . Bilateral chronic knee pain [M25.561, M25.562, G89.29] 09/22/2012  . Smoker [Z72.0] 09/22/2012  . Elevated blood pressure [R03.0] 09/22/2012   Total Time spent with patient: 30 minutes   Past Medical History:  Past Medical History  Diagnosis Date  . Back pain   . Thyroid disease     Past Surgical History  Procedure Laterality Date  . Hernia repair    . Knee arthorscopy     Family History: History reviewed. No pertinent family history. Social History:  History  Alcohol Use  . Yes    Comment: occassionally     History  Drug Use No    History   Social History  . Marital Status: Legally  Separated    Spouse Name: N/A  . Number of Children: N/A  . Years of Education: N/A   Social History Main Topics  . Smoking status: Current Some Day Smoker    Types: Cigarettes  . Smokeless tobacco: Never Used  . Alcohol Use: Yes     Comment: occassionally  . Drug Use: No  . Sexual Activity: Not on file   Other Topics Concern  . None   Social History Narrative   Additional History:    Sleep: Good  Appetite:  Good  Assessment:   Musculoskeletal: Strength & Muscle Tone: within normal limits Gait & Station: normal Patient leans: N/A  Psychiatric Specialty Exam: Physical Exam  ROS  Blood pressure 133/65, pulse 76, temperature 98.3 F (36.8 C), temperature source Oral, resp. rate 18, height 5\' 7"  (1.702 m), weight 112.946 kg (249 lb), last menstrual period 07/21/2014.Body mass index is 38.99 kg/(m^2).  General Appearance: Fairly Groomed  Engineer, water::  Fair  Speech:  Clear and Coherent  Volume:  Increased  Mood:  Anxious and worried but "happpy" she saw her friend here  Affect:  Labile  Thought Process:  Coherent and Goal Directed  Orientation:  Full (Time, Place, and Person)  Thought Content:  symptoms events worries concerns  Suicidal Thoughts:  No  Homicidal Thoughts:  No  Memory:  Immediate;   Fair Recent;   Fair Remote;   Fair  Judgement:  Fair  Insight:  Present and Shallow  Psychomotor Activity:  Normal, however, going through  nicotine withdrawal  Concentration:  Good  Recall:  [place]  Language: Fair  Akathisia:  No  Handed:  Right  AIMS (if indicated):     Assets:  Desire for Improvement Housing  ADL's:  Intact  Cognition: WNL  Sleep:  Number of Hours: 6   Current Medications: Current Facility-Administered Medications  Medication Dose Route Frequency Provider Last Rate Last Dose  . acetaminophen (TYLENOL) tablet 650 mg  650 mg Oral Q6H PRN Delfin Gant, NP      . alum & mag hydroxide-simeth (MAALOX/MYLANTA)  200-200-20 MG/5ML suspension 30 mL  30 mL Oral Q4H PRN Delfin Gant, NP   30 mL at 07/25/14 1717  . amLODipine (NORVASC) tablet 5 mg  5 mg Oral Daily Delfin Gant, NP   5 mg at 07/28/14 0820  . hydrOXYzine (ATARAX/VISTARIL) tablet 50 mg  50 mg Oral TID PRN Delfin Gant, NP      . ibuprofen (ADVIL,MOTRIN) tablet 600 mg  600 mg Oral Q6H PRN Delfin Gant, NP   600 mg at 07/28/14 0823  . levothyroxine (SYNTHROID, LEVOTHROID) tablet 50 mcg  50 mcg Oral QAC breakfast Delfin Gant, NP   50 mcg at 07/28/14 [phone]  . loperamide (IMODIUM) capsule 2 mg  2 mg Oral PRN Laverle Hobby, PA-C   2 mg at 07/26/14 1535  . loratadine (CLARITIN) tablet 10 mg  10 mg Oral Daily Delfin Gant, NP   10 mg at 07/28/14 0820  . LORazepam (ATIVAN) tablet 1 mg  1 mg Oral Q6H PRN Delfin Gant, NP   1 mg at 07/20/14 2224  . magnesium hydroxide (MILK OF MAGNESIA) suspension 30 mL  30 mL Oral Daily PRN Delfin Gant, NP      . nicotine (NICODERM CQ - dosed in mg/24 hours) patch 14 mg  14 mg Transdermal Daily Laverle Hobby, PA-C   14 mg at 07/28/14 0820  . ondansetron (ZOFRAN-ODT) disintegrating tablet 4 mg  4 mg Oral Q8H PRN Laverle Hobby, PA-C   4 mg at 07/23/14 0540  . pantoprazole (PROTONIX) EC tablet 40 mg  40 mg Oral Daily Laverle Hobby, PA-C   40 mg at 07/28/14 0820  . zolpidem (AMBIEN) tablet 5 mg  5 mg Oral QHS Delfin Gant, NP   5 mg at 07/27/14 2144    Lab Results:  No results found for this or any previous visit (from the past 48 hour(s)).  Physical Findings: AIMS: Facial and Oral Movements Muscles of Facial Expression: None, normal Lips and Perioral Area: None, normal Jaw: None, normal Tongue: None, normal,Extremity Movements Upper (arms, wrists, hands, fingers): None, normal Lower (legs, knees, ankles, toes): None, normal, Trunk Movements Neck, shoulders, hips: None, normal, Overall Severity Severity of abnormal movements (highest score from questions  above): None, normal Incapacitation due to abnormal movements: None, normal Patient's awareness of abnormal movements (rate only patient's report): No Awareness, Dental Status Current problems with teeth and/or dentures?: No Does patient usually wear dentures?: No  CIWA:  CIWA-Ar Total: 3 COWS:     Treatment Plan Summary: Daily contact with patient to assess and evaluate symptoms and progress in treatment and Medication management 1. Continue crisis management, mood stabilization & relapse prevention.. 2. Continue current medication management to reduce current symptoms to base line and improve the  patient's overall level of functioning; Ambien 5 mg Q bedtime for sleep, Lorazepam 1 mg for anxiety 3. Treat health problems  as indicated; continue Norvasc 5 mg for HTN, Synthroid 50 mcg for hypothyroidism, Protonix 40 mg for acid reflux. 4. Develop treatment plan to enhance medication adeherance upon discharge and the need for  readmission. 5. Psycho-social education regarding relapse prevention and self care. 6. Possible discharge in am.  Medical Decision Making:  Review of Psycho-Social Stressors (1) and Review of Medication Regimen & Side Effects (2)  Encarnacion Slates, PMHNP, FNP-BC 07/28/2014, 2:16 PM  Agree with Assessment and Plan

## 2014-07-28 NOTE — Progress Notes (Signed)
D) Pt has been doing well today. Spends her day on the [address] where she started out. Has several people over on that hall that she feels she can relate to. Affect and mood are both appropriately bright. Pt denies SI and HI. A) Pt given support and reassurance along with praise. Provided with a 1:1. Therapeutic humor used with Pt. Encouraged to continue to use the coping skills that she has learned here when she goes to her next program. R) Pt denies SI and HI.

## 2014-07-28 NOTE — B[facility] Group Notes (Signed)
Adult Psychoeducational Group Note  Date:  07/28/2014 Time:  2000  Group Topic/Focus:  Wrap-up Group  Participation Level:  Active  Participation Quality:  Appropriate  Affect:  Appropriate  Cognitive:  Appropriate  Insight: Appropriate  Engagement in Group:  Unknown  Modes of Intervention:  Unknown  Additional Comments:  Pt attended [place] group  [place], [place] 07/28/2014, 11:43 PM

## 2014-07-28 NOTE — Progress Notes (Signed)
D: Pt's mood is pleasant and she brightens more upon approach. She did express some anxiety about discharge tomorrow but is optimistic.  A: Support given. Verbalization encouraged. Medications given as prescribed. Pt encouraged to come to nurse with any concerns.  R: Pt is receptive. No complaints of pain or discomfort at this time. Q15 min safety checks maintained. Will continue to monitor.

## 2014-07-28 NOTE — Progress Notes (Signed)
D) Pt came out of the cafeteria stating that she did not feel well and that she was dizzy. Stated "I am having trouble breathing. I am short of breath". Pt was able to admit that she was feeling anxious. "I was standing in line looking around the room praying for all the people here and I started feeling strange. I am afraid. I want to not use drugs or alcohol. I want to be free of it all and I am afraid" A) Vital signs obtained O2 Sat 100%, BP 142/72, pulse 69 and respirations 22/min. Provided Pt with a lengthy 1:1. Encouraged Pt to verbalize her feelings and concerns, which was discussed in the 1:1. Provided Pt with Vistaril 50 mg for her anxiety. R) Pt was able to breathe normally and is presently resting on her bed.

## 2014-07-29 NOTE — Progress Notes (Signed)
D:  Per pt self inventory pt reports sleeping good with sleep medication, appetite good, energy level low, ability to pay attention good, rates hopelessness at a 0 out of 10 and anxiety at a 1 out of 10, denies SI/HI/AVH, Goal for today is to "focus and stick to my plans".    A:  Emotional support provided, Encouraged pt to continue with treatment plan and attend all group activities, q15 min checks maintained for safety.  R:  Pt was anxious early in shift after spiritual group and had to lay down, going to groups, cooperative with staff and other patients.

## 2014-07-29 NOTE — Progress Notes (Signed)
[facility] MD Progress Note  07/29/2014 4:29 PM Jamie Hicks  MRN:  [id number] Subjective:  Jamie Hicks is trying to plan for what needs to happen for her to be able not to relapse. States she is tired of drinking using cocaine, smoking. She is anticipating being able to go to [facility] this Thursday when she goes for the assessment. Hopes they have a bed for her then. She states that she has her phone number changed to avoid getting people she uses with to call her. She is going to avoid going around them. Once she gets out of rehab she wants to work with the [facility] case manager to see if she can get another living place as states that where she is now there are "drugs all over the place." She will be working on creating a support network. She states she was inspired by the [place] speaker who came yesterday evening. She also plans to build support through her church. She is committed to keeping up with her thyroid. States that just having been back on the thyroid for these last few days she can already tell of a difference Principal Problem: Substance induced mood disorder Diagnosis:   Patient Active Problem List   Diagnosis Date Noted  . Cocaine dependence with cocaine-induced mood disorder [F14.24] 07/22/2014    Priority: High  . Major depressive disorder, recurrent, severe without psychotic features [F33.2]     Priority: High  . Hypothyroidism [E03.9] 09/22/2012    Priority: High  . Substance induced mood disorder [F19.94] 07/19/2014    Priority: Medium  . Suicidal ideation [R45.851]   . Homicidal ideation [R45.850]   . Osteoarthritis [M19.90] 09/24/2012  . Prediabetes [R73.09] 09/24/2012  . Dyslipidemia [E78.5] 09/24/2012  . Vitamin D insufficiency [E55.9] 09/24/2012  . Bilateral chronic knee pain [M25.561, M25.562, G89.29] 09/22/2012  . Smoker [Z72.0] 09/22/2012  . Elevated blood pressure [R03.0] 09/22/2012   Total Time spent with patient: 30 minutes   Past Medical History:  Past Medical  History  Diagnosis Date  . Back pain   . Thyroid disease     Past Surgical History  Procedure Laterality Date  . Hernia repair    . Knee arthorscopy     Family History: History reviewed. No pertinent family history. Social History:  History  Alcohol Use  . Yes    Comment: occassionally     History  Drug Use No    History   Social History  . Marital Status: Legally Separated    Spouse Name: N/A  . Number of Children: N/A  . Years of Education: N/A   Social History Main Topics  . Smoking status: Current Some Day Smoker    Types: Cigarettes  . Smokeless tobacco: Never Used  . Alcohol Use: Yes     Comment: occassionally  . Drug Use: No  . Sexual Activity: Not on file   Other Topics Concern  . None   Social History Narrative   Additional History:    Sleep: Fair  Appetite:  Fair   Assessment:   Musculoskeletal: Strength & Muscle Tone: within normal limits Gait & Station: normal Patient leans: N/A   Psychiatric Specialty Exam: Physical Exam  Review of Systems  Constitutional: Negative.   HENT: Negative.   Eyes: Negative.   Respiratory: Negative.   Cardiovascular: Negative.   Gastrointestinal: Negative.   Genitourinary: Negative.   Musculoskeletal: Positive for joint pain.  Skin: Negative.   Neurological: Negative.   Endo/Heme/Allergies: Negative.   Psychiatric/Behavioral: Positive for depression  and substance abuse. The patient is nervous/anxious.     Blood pressure 134/76, pulse 67, temperature 97.5 F (36.4 C), temperature source Oral, resp. rate 20, height 5\' 7"  (1.702 m), weight 112.946 kg (249 lb), last menstrual period 07/21/2014, SpO2 100 %.Body mass index is 38.99 kg/(m^2).  General Appearance: Fairly Groomed  Engineer, water::  Fair  Speech:  Clear and Coherent  Volume:  fluctuates  Mood:  Anxious and Depressed  Affect:  Restricted  Thought Process:  Coherent and Goal Directed  Orientation:  Full (Time, Place, and Person)  Thought  Content:  symptoms events worries concerns  Suicidal Thoughts:  No  Homicidal Thoughts:  No  Memory:  Immediate;   Fair Recent;   Fair Remote;   Fair  Judgement:  Fair  Insight:  Present  Psychomotor Activity:  Restlessness  Concentration:  Fair  Recall:  [employer] of Knowledge:Fair  Language: Fair  Akathisia:  No  Handed:  Right  AIMS (if indicated):     Assets:  Desire for Improvement Housing Social Support  ADL's:  Intact  Cognition: WNL  Sleep:  Number of Hours: 5.5     Current Medications: Current Facility-Administered Medications  Medication Dose Route Frequency Provider Last Rate Last Dose  . acetaminophen (TYLENOL) tablet 650 mg  650 mg Oral Q6H PRN Delfin Gant, NP   650 mg at 07/28/14 2126  . alum & mag hydroxide-simeth (MAALOX/MYLANTA) 200-200-20 MG/5ML suspension 30 mL  30 mL Oral Q4H PRN Delfin Gant, NP   30 mL at 07/25/14 1717  . amLODipine (NORVASC) tablet 5 mg  5 mg Oral Daily Delfin Gant, NP   5 mg at 07/29/14 0758  . hydrOXYzine (ATARAX/VISTARIL) tablet 50 mg  50 mg Oral TID PRN Delfin Gant, NP   50 mg at 07/29/14 1120  . ibuprofen (ADVIL,MOTRIN) tablet 600 mg  600 mg Oral Q6H PRN Delfin Gant, NP   600 mg at 07/29/14 1120  . levothyroxine (SYNTHROID, LEVOTHROID) tablet 50 mcg  50 mcg Oral QAC breakfast Delfin Gant, NP   50 mcg at 07/29/14 0618  . loperamide (IMODIUM) capsule 2 mg  2 mg Oral PRN Laverle Hobby, PA-C   2 mg at 07/26/14 1535  . loratadine (CLARITIN) tablet 10 mg  10 mg Oral Daily Delfin Gant, NP   10 mg at 07/29/14 0758  . LORazepam (ATIVAN) tablet 1 mg  1 mg Oral Q6H PRN Delfin Gant, NP   1 mg at 07/20/14 2224  . magnesium hydroxide (MILK OF MAGNESIA) suspension 30 mL  30 mL Oral Daily PRN Delfin Gant, NP      . nicotine (NICODERM CQ - dosed in mg/24 hours) patch 14 mg  14 mg Transdermal Daily Laverle Hobby, PA-C   14 mg at 07/29/14 0759  . ondansetron (ZOFRAN-ODT) disintegrating  tablet 4 mg  4 mg Oral Q8H PRN Laverle Hobby, PA-C   4 mg at 07/23/14 0540  . pantoprazole (PROTONIX) EC tablet 40 mg  40 mg Oral Daily Laverle Hobby, PA-C   40 mg at 07/29/14 5102  . zolpidem (AMBIEN) tablet 5 mg  5 mg Oral QHS Delfin Gant, NP   5 mg at 07/28/14 2126    Lab Results: No results found for this or any previous visit (from the past 48 hour(s)).  Physical Findings: AIMS: Facial and Oral Movements Muscles of Facial Expression: None, normal Lips and Perioral Area: None, normal Jaw: None,  normal Tongue: None, normal,Extremity Movements Upper (arms, wrists, hands, fingers): None, normal Lower (legs, knees, ankles, toes): None, normal, Trunk Movements Neck, shoulders, hips: None, normal, Overall Severity Severity of abnormal movements (highest score from questions above): None, normal Incapacitation due to abnormal movements: None, normal Patient's awareness of abnormal movements (rate only patient's report): No Awareness, Dental Status Current problems with teeth and/or dentures?: No Does patient usually wear dentures?: No  CIWA:  CIWA-Ar Total: 3 COWS:     Treatment Plan Summary: Daily contact with patient to assess and evaluate symptoms and progress in treatment and Medication management Supportive approach/coping skills/elapse prevention CBT/mindfulness  Cocaine Dependence: monitor the mood instability, continue to work a relapse prevention plan Major Depression: mainly substance induced plus the contribution of the hypothyroidism will continue to monitor for the needs of an antidepressant Facilitate admission to [facility] Thursday morning  Medical Decision Making:  Review of Psycho-Social Stressors (1), Review of Medication Regimen & Side Effects (2) and Review of New Medication or Change in Dosage (2)     Kathe Wirick A 07/29/2014, 4:29 PM

## 2014-07-29 NOTE — BHH Group Notes (Signed)
   [facility] LCSW Aftercare Discharge Planning Group Note  07/29/2014  8:45 AM   Participation Quality: Alert, Appropriate and Oriented  Mood/Affect: Depressed and Flat  Depression Rating: Patient denies experiencing depression this morning but is unable to rate  Anxiety Rating: Patient unable to rate  Thoughts of Suicide: Pt denies SI/HI  Will you contract for safety? Yes  Current AVH: Pt denies  Plan for Discharge/Comments: Pt attended discharge planning group and actively participated in group. CSW provided pt with today's workbook. Patient reports not feeling ready for discharge today and reports feeling tired, light headed, and stated that she had an anxiety attack yesterday. She plans to return home to follow up with [facility] on 08/01/14.  Transportation Means: Pt reports access to transportation  Supports: No supports mentioned at this time  Tilden Fossa, MSW, [place] Social Worker [employer] [phone]

## 2014-07-29 NOTE — Progress Notes (Signed)
D: client visible on the unit, notes admission has been conducive "talking to people, listening and sharing" Client reports she knew she needed to be here due to "stress" and addiction. Plans to go to [facility] for treatment, notes that she has support system while awaiting admit. Client says of drugs "I don' want it anymore" client denies panic attack or anxiety today. A: Writer introduced self to client provided emotional support, reviewed medications and administered as ordered. Staff will monitor q5min for safety. R: client is safe on the unit, attended NA/AA group.

## 2014-07-29 NOTE — BHH Group Notes (Signed)
[place] LCSW Group Therapy 07/29/2014  1:15 pm  Type of Therapy: Group Therapy Participation Level: Active  Participation Quality: Attentive, Sharing and Supportive  Affect: Appropriate  Cognitive: Alert and Oriented  Insight: Developing/Improving and Engaged  Engagement in Therapy: Developing/Improving and Engaged  Modes of Intervention: Clarification, Confrontation, Discussion, Education, Exploration,  Limit-setting, Orientation, Problem-solving, Rapport Building, Art therapist, Socialization and Support  Summary of Progress/Problems: Pt identified obstacles faced currently and processed barriers involved in overcoming these obstacles. Pt identified steps necessary for overcoming these obstacles and explored motivation (internal and external) for facing these difficulties head on. Pt further identified one area of concern in their lives and chose a goal to focus on for today. Patient identified her need to "change people, places, and things". Patient discussed the negative consequences of drug use and how she has had to overcome many challenges such as being a single mother. She provided other group members with emotional support and encouragement. Patient discussed feeling hopeful for her recovery. Patient made good eye contact and was very talkative and engaged in discussion.  Tilden Fossa, MSW, [place] Worker [facility] [phone]

## 2014-07-29 NOTE — Progress Notes (Signed)
Patient ID: Jamie Hicks, female   DOB: 1955-12-29, 59 y.o.   MRN: [id number] PER STATE REGULATIONS 482.30  THIS CHART WAS REVIEWED FOR MEDICAL NECESSITY WITH RESPECT TO THE PATIENT'S ADMISSION/ DURATION OF STAY.  NEXT REVIEW DATE: 08/01/2014  Chauncy Lean, RN, BSN CASE MANAGER '

## 2014-07-30 DIAGNOSIS — E039 Hypothyroidism, unspecified: Secondary | ICD-10-CM | POA: Insufficient documentation

## 2014-07-30 MED ORDER — HYDROXYZINE HCL 50 MG PO TABS: 50.0000 mg | ORAL_TABLET | Freq: Three times a day (TID) | ORAL | Status: DC | PRN

## 2014-07-30 MED ORDER — ZOLPIDEM TARTRATE 5 MG PO TABS: 5.0000 mg | ORAL_TABLET | Freq: Every day | ORAL | Status: DC

## 2014-07-30 MED ORDER — IPRATROPIUM BROMIDE 0.06 % NA SOLN: 2.0000 | Freq: Four times a day (QID) | NASAL | Status: DC

## 2014-07-30 MED ORDER — LORATADINE 10 MG PO TABS: 10.0000 mg | ORAL_TABLET | Freq: Every day | ORAL | Status: DC

## 2014-07-30 MED ORDER — MAGNESIUM CITRATE PO SOLN: 1.0000 | Freq: Once | ORAL | Status: DC

## 2014-07-30 MED ORDER — LEVOTHYROXINE SODIUM 50 MCG PO TABS: 50.0000 ug | ORAL_TABLET | Freq: Every day | ORAL | Status: DC

## 2014-07-30 MED ORDER — AMLODIPINE BESYLATE 5 MG PO TABS: 5.0000 mg | ORAL_TABLET | Freq: Every day | ORAL | Status: DC

## 2014-07-30 MED ORDER — NICOTINE 14 MG/24HR TD PT24: 14.0000 mg | MEDICATED_PATCH | Freq: Every day | TRANSDERMAL | Status: DC

## 2014-07-30 NOTE — Discharge Summary (Signed)
Physician Discharge Summary Note  Patient:  Jamie Hicks is an 59 y.o., female MRN:  [id number] DOB:  04/20/1956 Patient phone:  [phone] (home)  Patient address:   [address]  [place]  [address],  Total Time spent with patient: Greater than 30 minutes  Date of Admission:  07/20/2014 Date of Discharge: 07/30/14  Reason for Admission: Mood stabilization treatment  Principal Problem: Substance induced mood disorder Discharge Diagnoses: Patient Active Problem List   Diagnosis Date Noted  . Thyroid activity decreased [E03.9]   . Cocaine dependence with cocaine-induced mood disorder [F14.24] 07/22/2014  . Major depressive disorder, recurrent, severe without psychotic features [F33.2]   . Substance induced mood disorder [F19.94] 07/19/2014  . Suicidal ideation [R45.851]   . Homicidal ideation [R45.850]   . Osteoarthritis [M19.90] 09/24/2012  . Prediabetes [R73.09] 09/24/2012  . Dyslipidemia [E78.5] 09/24/2012  . Vitamin D insufficiency [E55.9] 09/24/2012  . Hypothyroidism [E03.9] 09/22/2012  . Bilateral chronic knee pain [M25.561, M25.562, G89.29] 09/22/2012  . Smoker [Z72.0] 09/22/2012  . Elevated blood pressure [R03.0] 09/22/2012   Musculoskeletal: Strength & Muscle Tone: within normal limits Gait & Station: normal Patient leans: N/A  Psychiatric Specialty Exam: Physical Exam  Psychiatric: Her speech is normal and behavior is normal. Judgment normal. Her mood appears not anxious. Her affect is not angry, not blunt, not labile and not inappropriate. Cognition and memory are normal. She does not exhibit a depressed mood.    Review of Systems  Constitutional: Negative.   HENT: Negative.   Eyes: Negative.   Cardiovascular: Negative.   Gastrointestinal: Negative.   Genitourinary: Negative.   Musculoskeletal: Negative.   Skin: Negative.   Neurological: Negative.   Endo/Heme/Allergies: Negative.   Psychiatric/Behavioral: Positive for depression (Stable) and  substance abuse (Alcoholism, cocaine dependence). Negative for suicidal ideas and hallucinations. The patient has insomnia. The patient is not nervous/anxious.     Blood pressure 141/81, pulse 76, temperature 98.2 F (36.8 C), temperature source Oral, resp. rate 16, height 5\' 7"  (1.702 m), weight 112.946 kg (249 lb), last menstrual period 07/21/2014, SpO2 100 %.Body mass index is 38.99 kg/(m^2).  See Md's SRA   Past Medical History:  Past Medical History  Diagnosis Date  . Back pain   . Thyroid disease     Past Surgical History  Procedure Laterality Date  . Hernia repair    . Knee arthorscopy     Family History: History reviewed. No pertinent family history. Social History:  History  Alcohol Use  . Yes    Comment: occassionally     History  Drug Use No    History   Social History  . Marital Status: Legally Separated    Spouse Name: N/A  . Number of Children: N/A  . Years of Education: N/A   Social History Main Topics  . Smoking status: Current Some Day Smoker    Types: Cigarettes  . Smokeless tobacco: Never Used  . Alcohol Use: Yes     Comment: occassionally  . Drug Use: No  . Sexual Activity: Not on file   Other Topics Concern  . None   Social History Narrative    Risk to Self: Is patient at risk for suicide?: No What has been your use of drugs/alcohol within the last 12 months?: Patient reports she binges on alcohol 2-3 times monthly; uses cocaine 2-3 times monthly and THC 1-2 weekly Risk to Others: No Prior Inpatient Therapy: Yes Prior Outpatient Therapy: Yes  Level of Care:  OP  Hospital Course:  Patient states "I'm here cause ofI'm depressed, suicidal, and have substance abuse problems. I was having suicidal thoughts but I didn't have a plan or nothing. I need help with my depression and substance abuse. I drink about 2-3 time a week. I use cocaine and weed. I want to change, I want help. Look when I'm out there I'm out in harms way; I can hurt  somebody or somebody can hurt me." Patient is agitated.  After admission assessment/evaluation, it was determined based on patient's symptoms that she is in need of mood stabilization treatments as well as substance abuse treatments. Although feeling depressed and upset, patient was not presenting with any withdrawal symptoms of substances. As a result, she received no detoxification treatment protocols. She was however, medicated with & discharged on Hydroxyzine 50 mg three times daily as needed for anxiety and Ambien 5 mg Q bedtime for sleep. Jalysa was enrolled in the group counseling sessions and activities where she was counseled and learned coping skills that should help her cope better and manage her symptoms effectively after discharge. She was resumed on all her pertinent home medications for her other previously existing medical issues and concerns. She tolerated her treatment regimen without any significant adverse effects and or reactions presented.   Patient did respond positively to her treatment regimen. This is evidenced by her daily reports of improved mood, reduction of symptoms and presentation of good affect/eye contact. She is stable for discharge to pursue substance abuse treatment. [name] is referred and has appointment to participate in the [facility] starting on 08-01-14. She is provided with all the necessary information required to contact & make this appointment without problems. Upon discharge, [name] adamantly denies any suicidal, homicidal ideations, auditory, visual hallucinations, paranoia and or delusional thoughts. She was provided with a 14 days worth supply samples of her [facility] discharge medications. She left [facility] with all personal belongings in no apparent distress. [facility] assisted patient with a bus pass in the event that she could not arrange her own transportation.   Consults:  psychiatry  Significant Diagnostic Studies:  labs:  CBC with diff, CMP, UDS, toxicology tests, U/A, results reviewed, no changes  Discharge Vitals:   Blood pressure 141/81, pulse 76, temperature 98.2 F (36.8 C), temperature source Oral, resp. rate 16, height 5\' 7"  (1.702 m), weight 112.946 kg (249 lb), last menstrual period 07/21/2014, SpO2 100 %. Body mass index is 38.99 kg/(m^2). Lab Results:   No results found for this or any previous visit (from the past 72 hour(s)).  Physical Findings: AIMS: Facial and Oral Movements Muscles of Facial Expression: None, normal Lips and Perioral Area: None, normal Jaw: None, normal Tongue: None, normal,Extremity Movements Upper (arms, wrists, hands, fingers): None, normal Lower (legs, knees, ankles, toes): None, normal, Trunk Movements Neck, shoulders, hips: None, normal, Overall Severity Severity of abnormal movements (highest score from questions above): None, normal Incapacitation due to abnormal movements: None, normal Patient's awareness of abnormal movements (rate only patient's report): No Awareness, Dental Status Current problems with teeth and/or dentures?: No Does patient usually wear dentures?: No  CIWA:  CIWA-Ar Total: 3 COWS:     See Psychiatric Specialty Exam and Suicide Risk Assessment completed by Attending Physician prior to discharge.  Discharge destination:  Home  Is patient on multiple antipsychotic therapies at discharge:  No   Has Patient had three or more failed trials of antipsychotic monotherapy by history:  No  Recommended Plan for Multiple Antipsychotic Therapies: NA  Medication List    TAKE these medications      Indication   amLODipine 5 MG tablet  Commonly known as:  NORVASC  Take 1 tablet (5 mg total) by mouth daily. For high blood pressure   Indication:  High Blood Pressure     hydrOXYzine 50 MG tablet  Commonly known as:  ATARAX/VISTARIL  Take 1 tablet (50 mg total) by mouth 3 (three) times daily as needed (anxiety).   Indication:  Anxiety      ipratropium 0.06 % nasal spray  Commonly known as:  ATROVENT  Place 2 sprays into both nostrils 4 (four) times daily. For allergies   Indication:  Runny Nose associated with Hayfever, Runny Nose associated with a Cold     levothyroxine 50 MCG tablet  Commonly known as:  SYNTHROID, LEVOTHROID  Take 1 tablet (50 mcg total) by mouth daily before breakfast. For low thyroid function   Indication:  Underactive Thyroid     loratadine 10 MG tablet  Commonly known as:  CLARITIN  Take 1 tablet (10 mg total) by mouth daily. (This medicine may be purchased from over there counter at yr local pharmacy): For allergies   Indication:  Perennial Rhinitis, Hayfever     nicotine 14 mg/24hr patch  Commonly known as:  NICODERM CQ - dosed in mg/24 hours  Place 1 patch (14 mg total) onto the skin daily. For nicotine addiction   Indication:  Nicotine Addiction     zolpidem 5 MG tablet  Commonly known as:  AMBIEN  Take 1 tablet (5 mg total) by mouth at bedtime. For sleep   Indication:  Trouble Sleeping       Follow-up Information    Follow up with [facility] On 08/01/2014.   Why:  Admissions screening on Thursday Feb. 18th at 8 am. Please bring your ID and insurance cards, medications, and clothing to appointment. Please call office if you need to reschedule appointment.   Contact information:   [place],  [place], [place] [address] Phone: [phone] Fax: [phone]     Follow-up recommendations: Activity:  As tolerated Diet: As recommended by your primary care doctor. Keep all scheduled follow-up appointments as recommended.  Comments:  Take all your medications as prescribed by your mental healthcare provider. Report any adverse effects and or reactions from your medicines to your outpatient provider promptly. Patient is instructed and cautioned to not engage in alcohol and or illegal drug use while on prescription medicines. In the event of worsening symptoms, patient is  instructed to call the crisis hotline, 911 and or go to the nearest ED for appropriate evaluation and treatment of symptoms. Follow-up with your primary care provider for your other medical issues, concerns and or health care needs.   Total Discharge Time: Greater than 30 minutes  Signed: Encarnacion Slates, PMHNP-BC 07/30/2014, 3:22 PM  I personally assessed the patient and formulated the plan Geralyn Flash A. Sabra Heck, M.D.

## 2014-07-30 NOTE — BHH Group Notes (Signed)
[facility] Group Notes:  (Nursing/MHT/Case Management/Adjunct)  Date:  07/30/2014  Time:  0900am  Type of Therapy:  Nurse Education  Participation Level:  Active  Participation Quality:  Appropriate and Attentive  Affect:  Appropriate  Cognitive:  Alert and Appropriate  Insight:  Appropriate and Good  Engagement in Group:  Engaged  Modes of Intervention:  Discussion, Education and Support  Summary of Progress/Problems: Patient attended group and responded appropriately when prompted. Pt reports her goal for the day is to "go home and avoid people" that are her triggers and to "[facility]."  Jimmye [place], [place] A 07/30/2014, 10:08 AM

## 2014-07-30 NOTE — Progress Notes (Signed)
DISCHARGE NOTE: D: Patient was alert, oriented, in stable condition, and ambulatory with a steady gait upon discharge. Pt denies SI/HI and AVH. A: AVS reviewed and given to pt. Follow up reviewed with pt. Resources reviewed with pt, including [facility]. Prescriptions/Medications given to pt. Belongings returned to pt. Pt given time to ask questions and express concerns. R: Pt D/C'd to daughter.

## 2014-07-30 NOTE — Progress Notes (Signed)
D: Patient is alert and oriented. Pt's mood and affect is pleasant and appropriate to circumstance. Pt reports she is ready to go home today. Pt denies SI/HI and AVH today. Pt is attending groups. Pt denies depression, hopelessness, and anxiety.  A: Encouragement/Support provided to pt. Active listening by RN. Scheduled medications administered per providers orders (See MAR). 15 minute checks continued per protocol for patient safety.  R: Patient cooperative and receptive to nursing interventions. Pt remains safe.

## 2014-07-30 NOTE — BHH Suicide Risk Assessment (Signed)
[facility] Discharge Suicide Risk Assessment   Demographic Factors:  Unemployed  Total Time spent with patient: 30 minutes  Musculoskeletal: Strength & Muscle Tone: within normal limits Gait & Station: normal Patient leans: N/A  Psychiatric Specialty Exam: Physical Exam  Review of Systems  Constitutional: Negative.   HENT: Negative.   Eyes: Negative.   Respiratory: Negative.   Cardiovascular: Negative.   Gastrointestinal: Negative.   Genitourinary: Negative.   Musculoskeletal: Negative.   Skin: Negative.   Neurological: Negative.   Endo/Heme/Allergies: Negative.   Psychiatric/Behavioral: Positive for substance abuse. The patient is nervous/anxious.     Blood pressure 141/81, pulse 76, temperature 98.2 F (36.8 C), temperature source Oral, resp. rate 16, height 5\' 7"  (1.702 m), weight 112.946 kg (249 lb), last menstrual period 07/21/2014, SpO2 100 %.Body mass index is 38.99 kg/(m^2).  General Appearance: Fairly Groomed  Engineer, water::  Fair  Speech:  Clear and [id number]  Volume:  fluctuates  Mood:  Anxious  Affect:  Appropriate  Thought Process:  Coherent and Goal Directed  Orientation:  Full (Time, Place, and Person)  Thought Content:  plans as she moves on, relapse prevention plan  Suicidal Thoughts:  No  Homicidal Thoughts:  No  Memory:  Immediate;   Fair Recent;   Fair Remote;   Fair  Judgement:  Fair  Insight:  Present  Psychomotor Activity:  Restlessness  Concentration:  Fair  Recall:  [employer] of Knowledge:Fair  Language: Fair  Akathisia:  No  Handed:  Right  AIMS (if indicated):     Assets:  Desire for Improvement Housing  Sleep:  Number of Hours: 5.5  Cognition: WNL  ADL's:  Intact   Have you used any form of tobacco in the last 30 days? (Cigarettes, Smokeless Tobacco, Cigars, and/or Pipes): Yes  Has this patient used any form of tobacco in the last 30 days? (Cigarettes, Smokeless Tobacco, Cigars, and/or Pipes) Yes, plans to use nicotine patches.    Mental Status Per Nursing Assessment::   On Admission:  NA  Current Mental Status by Physician: In full contact with reality. There are no S/S of withdrawal. There are no active SI plans or intent. She states she is committed to make this plan work for herself. She has an appointment at [facility] Thursday morning to be admitted to the residential treatment program. She plans to pursue long term abstinence as well as management of her thyroid disorder   Loss Factors: Decline in physical health  Historical Factors: NA  Risk Reduction Factors:   Sense of responsibility to family and Living with another person, especially a relative  Continued Clinical Symptoms:  Alcohol/Substance Abuse/Dependencies  Cognitive Features That Contribute To Risk:  Closed-mindedness, Polarized thinking and Thought constriction (tunnel vision)    Suicide Risk:  Minimal: No identifiable suicidal ideation.  Patients presenting with no risk factors but with morbid ruminations; may be classified as minimal risk based on the severity of the depressive symptoms  Principal Problem: Substance induced mood disorder Discharge Diagnoses:  Patient Active Problem List   Diagnosis Date Noted  . Cocaine dependence with cocaine-induced mood disorder [F14.24] 07/22/2014    Priority: High  . Major depressive disorder, recurrent, severe without psychotic features [F33.2]     Priority: High  . Hypothyroidism [E03.9] 09/22/2012    Priority: High  . Substance induced mood disorder [F19.94] 07/19/2014    Priority: Medium  . Suicidal ideation [R45.851]   . Homicidal ideation [R45.850]   . Osteoarthritis [M19.90] 09/24/2012  . Prediabetes [R73.09]  09/24/2012  . Dyslipidemia [E78.5] 09/24/2012  . Vitamin D insufficiency [E55.9] 09/24/2012  . Bilateral chronic knee pain [M25.561, M25.562, G89.29] 09/22/2012  . Smoker [Z72.0] 09/22/2012  . Elevated blood pressure [R03.0] 09/22/2012    Follow-up Information    Follow up  with [facility] On 08/01/2014.   Why:  Admissions screening on Thursday Feb. 18th at 8 am. Please bring your ID and insurance cards, medications, and clothing to appointment. Please call office if you need to reschedule appointment.   Contact information:   [place],  [place], [place] [address] Phone: [phone] Fax: [phone]      Plan Of Care/Follow-up recommendations:  Activity:  as tolerated Diet:  as per nutritionist Follow up [facility] residential as above Is patient on multiple antipsychotic therapies at discharge:  No   Has Patient had three or more failed trials of antipsychotic monotherapy by history:  No  Recommended Plan for Multiple Antipsychotic Therapies: NA    Avaley Coop A 07/30/2014, 12:13 PM

## 2014-07-30 NOTE — Progress Notes (Signed)
Pt attended spiritual care group on grief and loss facilitated by chaplain Jerene Pitch and counseling intern [place] Jove Beyl. Group opened with brief discussion and psycho-social ed around grief and loss in relationships and in relation to self - identifying life patterns, circumstances, changes that cause losses. Established group norm of speaking from own life experience. Group goal of establishing open and affirming space for members to share loss and experience with grief, normalize grief experience and provide psycho social education and grief support.  Group drew on narrative and Alderian therapeutic modalities.   Sharne spoke about the death of ten members of her community, as well as several family members passing away. Santrice spoke about being "on the streets" and how she has changed her life since then. Acknowledged how previous drug use could have detrimental to herself, and she now hopes to be better for her children. Sherleen shared a poem she had written about her hope to end the "vicious, generational cycle." Noell was supportive towards other group members.   Martinique Jarmarcus Wambold Counseling Intern

## 2014-07-30 NOTE — Progress Notes (Signed)
  [facility] Adult Case Management Discharge Plan :  Will you be returning to the same living situation after discharge:  Yes,  patient plans to return home with her family At discharge, do you have transportation home?: Yes,  patient will be provided with bus pass if she is unable to arrange transportation Do you have the ability to pay for your medications: Yes,  patient will be provided with prescriptions at [facility]  Release of information consent forms completed and in the chart;  Patient's signature needed at discharge.  Patient to Follow up at: Follow-up Information    Follow up with [facility] On 08/01/2014.   Why:  Admissions screening on Thursday Feb. 18th at 8 am. Please bring your ID and insurance cards, medications, and clothing to appointment. Please call office if you need to reschedule appointment.   Contact information:   [place],  [place], [place] [address] Phone: [phone] Fax: [phone]      Patient denies SI/HI: Yes,  denies    Safety Planning and Suicide Prevention discussed: Yes,  with patient and daughter  Have you used any form of tobacco in the last 30 days? (Cigarettes, Smokeless Tobacco, Cigars, and/or Pipes): Yes  Has patient been referred to the Quitline?: Yes, faxed on 07/30/14  Fatih Stalvey, Casimiro Needle 07/30/2014, 9:52 AM

## 2014-07-30 NOTE — Tx Team (Signed)
Interdisciplinary Treatment Plan Update (Adult) Date: 07/30/2014   Time Reviewed: 9:30 AM  Progress in Treatment: Attending groups: Yes Participating in groups: Yes Taking medication as prescribed: Yes Tolerating medication: Yes Family/Significant other contact made: Yes, CSW has spoken with patient's daughter Cyra Patient understands diagnosis: Yes Discussing patient identified problems/goals with staff: Yes Medical problems stabilized or resolved: Yes Denies suicidal/homicidal ideation: Yes Issues/concerns per patient self-inventory: Yes Other:  New problem(s) identified: N/A  Discharge Plan or Barriers:   2/9: CSW continuing to assess. Per treatment team, patient is requesting long term residential treatment at discharge.   2/12: Patient continuing to endorse feelings of hopelessness and depression. Patient plans to discharge home to follow up with screening at [facility] on Thursday 2/18.  2/16: Patient plans to discharge home to follow up with screening at [facility] on Thursday 2/18.   Reason for Continuation of Hospitalization:  Depression Anxiety Medication Stabilization   Comments: N/A  Estimated length of stay: Discharge anticipated for today 2/16.  For review of initial/current patient goals, please see plan of care.  Patient is a 59 year old female admitted following SI and polysubstance abuse. Patient lives in [place] with several family members. Patient will benefit from crisis stabilization, medication evaluation, group therapy, and psycho education in addition to case management for discharge planning. Patient and CSW reviewed pt's identified goals and treatment plan. Pt verbalized understanding and agreed to treatment plan.   Attendees:  Patient:    SignatureGabriel Earing, MD 07/30/2014 9:30 AM   Signature: Carlton Adam, MD 07/30/2014 9:30 AM   Signature:Ronecia Marva Panda, [place], [place] RN 07/30/2014  9:30 AM   Signature: 07/30/2014 9:30 AM   Signature: [place], LCSWA 07/30/2014 9:30 AM   Signature: Joette Catching, LCSW 07/30/2014 9:30 AM   Signature: Tilden Fossa, LCSWA 07/30/2014 9:30 AM   Signature: Lucinda Dell, Care Coordinator [facility] 07/30/2014 9:30 AM   Signature:    Signature:          Scribe for Treatment Team:  Tilden Fossa, MSW, [place] [phone]

## 2014-08-02 NOTE — Progress Notes (Signed)
Patient Discharge Instructions:  After Visit Summary (AVS):   Faxed to:  08/02/14 Discharge Summary Note:   Faxed to:  08/02/14 Psychiatric Admission Assessment Note:   Faxed to:  08/02/14 Suicide Risk Assessment - Discharge Assessment:   Faxed to:  08/02/14 Faxed/Sent to the Next Level Care provider:  08/02/14 Faxed to [facility], 08/02/2014, 3:33 PM

## 2014-08-09 NOTE — Clinical Social Work Note (Signed)
Patient contacted CSW reporting that she did not get a prescription for synthroid at discharge and is requesting a prescription. CSW spoke with NP regarding patient's request who reports that patient will need to get refill or prescription from her PCP. CSW attempted to contact patient back at [phone] several times but line busy.  Tilden Fossa, MSW, [place] Worker [facility] [phone]

## 2014-08-09 NOTE — Clinical Social Work Note (Signed)
CSW spoke with patient to inform her that she will need to go to her PCP for non-psychiatric medication needs. Patient became angry and verbally hostile, wanting to speak with the physician and stating that hospital staff are not helping her. CSW informed patient about [facility] for medical needs and provided patient with telephone number and encouraged patient to follow up with [facility] for psychiatric medications. Patient stated that she did not want to go to [facility].   Tilden Fossa, MSW, [place] Worker [facility] [phone]

## 2014-12-24 ENCOUNTER — Emergency Department (HOSPITAL_COMMUNITY)
Admission: EM | Admit: 2014-12-24 | Discharge: 2014-12-24 | Disposition: A | Discharge status: Home / Self Care | Payer: Medicare HMO | Attending: Emergency Medicine | Admitting: Emergency Medicine

## 2014-12-24 ENCOUNTER — Encounter (HOSPITAL_COMMUNITY): Payer: Self-pay | Admitting: Emergency Medicine

## 2014-12-24 ENCOUNTER — Emergency Department (HOSPITAL_COMMUNITY): Payer: Medicare HMO

## 2014-12-24 DIAGNOSIS — Z79899 Other long term (current) drug therapy: Secondary | ICD-10-CM | POA: Diagnosis not present

## 2014-12-24 DIAGNOSIS — M7989 Other specified soft tissue disorders: Secondary | ICD-10-CM | POA: Insufficient documentation

## 2014-12-24 DIAGNOSIS — M25532 Pain in left wrist: Secondary | ICD-10-CM | POA: Diagnosis not present

## 2014-12-24 DIAGNOSIS — Z91040 Latex allergy status: Secondary | ICD-10-CM | POA: Diagnosis not present

## 2014-12-24 DIAGNOSIS — Z720 Tobacco use: Secondary | ICD-10-CM | POA: Insufficient documentation

## 2014-12-24 DIAGNOSIS — E079 Disorder of thyroid, unspecified: Secondary | ICD-10-CM | POA: Insufficient documentation

## 2014-12-24 DIAGNOSIS — M25561 Pain in right knee: Secondary | ICD-10-CM | POA: Insufficient documentation

## 2014-12-24 DIAGNOSIS — M545 Low back pain, unspecified: Secondary | ICD-10-CM

## 2014-12-24 DIAGNOSIS — G8929 Other chronic pain: Secondary | ICD-10-CM | POA: Insufficient documentation

## 2014-12-24 MED ORDER — TRAMADOL HCL 50 MG PO TABS: 50.0000 mg | ORAL_TABLET | Freq: Four times a day (QID) | ORAL | Status: DC | PRN

## 2014-12-24 MED ORDER — IBUPROFEN 800 MG PO TABS: 800.0000 mg | ORAL_TABLET | Freq: Three times a day (TID) | ORAL | Status: DC

## 2014-12-24 MED ORDER — HYDROCODONE-ACETAMINOPHEN 5-325 MG PO TABS
2.0000 | ORAL_TABLET | Freq: Once | ORAL | Status: AC
  Administered 2014-12-24: 2 via ORAL
  Filled 2014-12-24: qty 2

## 2014-12-24 MED ORDER — PREDNISONE 20 MG PO TABS
60.0000 mg | ORAL_TABLET | Freq: Once | ORAL | Status: AC
  Administered 2014-12-24: 60 mg via ORAL
  Filled 2014-12-24: qty 3

## 2014-12-24 NOTE — Discharge Instructions (Signed)
Back Pain, Adult Low back pain is very common. About 1 in 5 people have back pain.The cause of low back pain is rarely dangerous. The pain often gets better over time.About half of people with a sudden onset of back pain feel better in just 2 weeks. About 8 in 10 people feel better by 6 weeks.  CAUSES Some common causes of back pain include:  Strain of the muscles or ligaments supporting the spine.  Wear and tear (degeneration) of the spinal discs.  Arthritis.  Direct injury to the back. DIAGNOSIS Most of the time, the direct cause of low back pain is not known.However, back pain can be treated effectively even when the exact cause of the pain is unknown.Answering your caregiver's questions about your overall health and symptoms is one of the most accurate ways to make sure the cause of your pain is not dangerous. If your caregiver needs more information, he or she may order lab work or imaging tests (X-rays or MRIs).However, even if imaging tests show changes in your back, this usually does not require surgery. HOME CARE INSTRUCTIONS For many people, back pain returns.Since low back pain is rarely dangerous, it is often a condition that people can learn to [facility] their own.   Remain active. It is stressful on the back to sit or stand in one place. Do not sit, drive, or stand in one place for more than 30 minutes at a time. Take short walks on level surfaces as soon as pain allows.Try to increase the length of time you walk each day.  Do not stay in bed.Resting more than 1 or 2 days can delay your recovery.  Do not avoid exercise or work.Your body is made to move.It is not dangerous to be active, even though your back may hurt.Your back will likely heal faster if you return to being active before your pain is gone.  Pay attention to your body when you bend and lift. Many people have less discomfortwhen lifting if they bend their knees, keep the load close to their bodies,and  avoid twisting. Often, the most comfortable positions are those that put less stress on your recovering back.  Find a comfortable position to sleep. Use a firm mattress and lie on your side with your knees slightly bent. If you lie on your back, put a pillow under your knees.  Only take over-the-counter or prescription medicines as directed by your caregiver. Over-the-counter medicines to reduce pain and inflammation are often the most helpful.Your caregiver may prescribe muscle relaxant drugs.These medicines help dull your pain so you can more quickly return to your normal activities and healthy exercise.  Put ice on the injured area.  Put ice in a plastic bag.  Place a towel between your skin and the bag.  Leave the ice on for 15-20 minutes, 03-04 times a day for the first 2 to 3 days. After that, ice and heat may be alternated to reduce pain and spasms.  Ask your caregiver about trying back exercises and gentle massage. This may be of some benefit.  Avoid feeling anxious or stressed.Stress increases muscle tension and can worsen back pain.It is important to recognize when you are anxious or stressed and learn ways to manage it.Exercise is a great option. SEEK MEDICAL CARE IF:  You have pain that is not relieved with rest or medicine.  You have pain that does not improve in 1 week.  You have new symptoms.  You are generally not feeling well. SEEK  IMMEDIATE MEDICAL CARE IF:   You have pain that radiates from your back into your legs.  You develop new bowel or bladder control problems.  You have unusual weakness or numbness in your arms or legs.  You develop nausea or vomiting.  You develop abdominal pain.  You feel faint. Document Released: 05/31/2005 Document Revised: 11/30/2011 Document Reviewed: 10/02/2013 [facility] Patient Information 2015 [place], [place]. This information is not intended to replace advice given to you by your health care provider. Make sure you  discuss any questions you have with your health care provider.  Musculoskeletal Pain Musculoskeletal pain is muscle and boney aches and pains. These pains can occur in any part of the body. Your caregiver may treat you without knowing the cause of the pain. They may treat you if blood or urine tests, X-rays, and other tests were normal.  CAUSES There is often not a definite cause or reason for these pains. These pains may be caused by a type of germ (virus). The discomfort may also come from overuse. Overuse includes working out too hard when your body is not fit. Boney aches also come from weather changes. Bone is sensitive to atmospheric pressure changes. HOME CARE INSTRUCTIONS   Ask when your test results will be ready. Make sure you get your test results.  Only take over-the-counter or prescription medicines for pain, discomfort, or fever as directed by your caregiver. If you were given medications for your condition, do not drive, operate machinery or power tools, or sign legal documents for 24 hours. Do not drink alcohol. Do not take sleeping pills or other medications that may interfere with treatment.  Continue all activities unless the activities cause more pain. When the pain lessens, slowly resume normal activities. Gradually increase the intensity and duration of the activities or exercise.  During periods of severe pain, bed rest may be helpful. Lay or sit in any position that is comfortable.  Putting ice on the injured area.  Put ice in a bag.  Place a towel between your skin and the bag.  Leave the ice on for 15 to 20 minutes, 3 to 4 times a day.  Follow up with your caregiver for continued problems and no reason can be found for the pain. If the pain becomes worse or does not go away, it may be necessary to repeat tests or do additional testing. Your caregiver may need to look further for a possible cause. SEEK IMMEDIATE MEDICAL CARE IF:  You have pain that is getting worse  and is not relieved by medications.  You develop chest pain that is associated with shortness or breath, sweating, feeling sick to your stomach (nauseous), or throw up (vomit).  Your pain becomes localized to the abdomen.  You develop any new symptoms that seem different or that concern you. MAKE SURE YOU:   Understand these instructions.  Will watch your condition.  Will get help right away if you are not doing well or get worse. Document Released: 05/31/2005 Document Revised: 08/23/2011 Document Reviewed: 02/02/2013 [facility] Patient Information 2015 [place], [place]. This information is not intended to replace advice given to you by your health care provider. Make sure you discuss any questions you have with your health care provider.  Knee Pain The knee is the complex joint between your thigh and your lower leg. It is made up of bones, tendons, ligaments, and cartilage. The bones that make up the knee are:  The femur in the thigh.  The tibia and  fibula in the lower leg.  The patella or kneecap riding in the groove on the lower femur. CAUSES  Knee pain is a common complaint with many causes. A few of these causes are:  Injury, such as:  A ruptured ligament or tendon injury.  Torn cartilage.  Medical conditions, such as:  Gout  Arthritis  Infections  Overuse, over training, or overdoing a physical activity. Knee pain can be minor or severe. Knee pain can accompany debilitating injury. Minor knee problems often respond well to self-care measures or get well on their own. More serious injuries may need medical intervention or even surgery. SYMPTOMS The knee is complex. Symptoms of knee problems can vary widely. Some of the problems are:  Pain with movement and weight bearing.  Swelling and tenderness.  Buckling of the knee.  Inability to straighten or extend your knee.  Your knee locks and you cannot straighten it.  Warmth and redness with pain and  fever.  Deformity or dislocation of the kneecap. DIAGNOSIS  Determining what is wrong may be very straight forward such as when there is an injury. It can also be challenging because of the complexity of the knee. Tests to make a diagnosis may include:  Your caregiver taking a history and doing a physical exam.  Routine X-rays can be used to rule out other problems. X-rays will not reveal a cartilage tear. Some injuries of the knee can be diagnosed by:  Arthroscopy a surgical technique by which a small video camera is inserted through tiny incisions on the sides of the knee. This procedure is used to examine and repair internal knee joint problems. Tiny instruments can be used during arthroscopy to repair the torn knee cartilage (meniscus).  Arthrography is a radiology technique. A contrast liquid is directly injected into the knee joint. Internal structures of the knee joint then become visible on X-ray film.  An MRI scan is a non X-ray radiology procedure in which magnetic fields and a computer produce two- or three-dimensional images of the inside of the knee. Cartilage tears are often visible using an MRI scanner. MRI scans have largely replaced arthrography in diagnosing cartilage tears of the knee.  Blood work.  Examination of the fluid that helps to lubricate the knee joint (synovial fluid). This is done by taking a sample out using a needle and a syringe. TREATMENT The treatment of knee problems depends on the cause. Some of these treatments are:  Depending on the injury, proper casting, splinting, surgery, or physical therapy care will be needed.  Give yourself adequate recovery time. Do not overuse your joints. If you begin to get sore during workout routines, back off. Slow down or do fewer repetitions.  For repetitive activities such as cycling or running, maintain your strength and nutrition.  Alternate muscle groups. For example, if you are a weight lifter, work the upper  body on one day and the lower body the next.  Either tight or weak muscles do not give the proper support for your knee. Tight or weak muscles do not absorb the stress placed on the knee joint. Keep the muscles surrounding the knee strong.  Take care of mechanical problems.  If you have flat feet, orthotics or special shoes may help. See your caregiver if you need help.  Arch supports, sometimes with wedges on the inner or outer aspect of the heel, can help. These can shift pressure away from the side of the knee most bothered by osteoarthritis.  A brace  called an "unloader" brace also may be used to help ease the pressure on the most arthritic side of the knee.  If your caregiver has prescribed crutches, braces, wraps or ice, use as directed. The acronym for this is PRICE. This means protection, rest, ice, compression, and elevation.  Nonsteroidal anti-inflammatory drugs (NSAIDs), can help relieve pain. But if taken immediately after an injury, they may actually increase swelling. Take NSAIDs with food in your stomach. Stop them if you develop stomach problems. Do not take these if you have a history of ulcers, stomach pain, or bleeding from the bowel. Do not take without your caregiver's approval if you have problems with fluid retention, heart failure, or kidney problems.  For ongoing knee problems, physical therapy may be helpful.  Glucosamine and chondroitin are over-the-counter dietary supplements. Both may help relieve the pain of osteoarthritis in the knee. These medicines are different from the usual anti-inflammatory drugs. Glucosamine may decrease the rate of cartilage destruction.  Injections of a corticosteroid drug into your knee joint may help reduce the symptoms of an arthritis flare-up. They may provide pain relief that lasts a few months. You may have to wait a few months between injections. The injections do have a small increased risk of infection, water retention, and  elevated blood sugar levels.  Hyaluronic acid injected into damaged joints may ease pain and provide lubrication. These injections may work by reducing inflammation. A series of shots may give relief for as long as 6 months.  Topical painkillers. Applying certain ointments to your skin may help relieve the pain and stiffness of osteoarthritis. Ask your pharmacist for suggestions. Many over the-counter products are approved for temporary relief of arthritis pain.  In some countries, doctors often prescribe topical NSAIDs for relief of chronic conditions such as arthritis and tendinitis. A review of treatment with NSAID creams found that they worked as well as oral medications but without the serious side effects. PREVENTION  Maintain a healthy weight. Extra pounds put more strain on your joints.  Get strong, stay limber. Weak muscles are a common cause of knee injuries. Stretching is important. Include flexibility exercises in your workouts.  Be smart about exercise. If you have osteoarthritis, chronic knee pain or recurring injuries, you may need to change the way you exercise. This does not mean you have to stop being active. If your knees ache after jogging or playing basketball, consider switching to swimming, water aerobics, or other low-impact activities, at least for a few days a week. Sometimes limiting high-impact activities will provide relief.  Make sure your shoes fit well. Choose footwear that is right for your sport.  Protect your knees. Use the proper gear for knee-sensitive activities. Use kneepads when playing volleyball or laying carpet. Buckle your seat belt every time you drive. Most shattered kneecaps occur in car accidents.  Rest when you are tired. SEEK MEDICAL CARE IF:  You have knee pain that is continual and does not seem to be getting better.  SEEK IMMEDIATE MEDICAL CARE IF:  Your knee joint feels hot to the touch and you have a high fever. MAKE SURE YOU:    Understand these instructions.  Will watch your condition.  Will get help right away if you are not doing well or get worse. Document Released: 03/28/2007 Document Revised: 08/23/2011 Document Reviewed: 03/28/2007 [facility] Patient Information 2015 [place], [place]. This information is not intended to replace advice given to you by your health care provider. Make sure you discuss any questions  you have with your health care provider.  Wrist Pain A wrist sprain happens when the bands of tissue that hold the wrist joints together (ligament) stretch too much or tear. A wrist strain happens when muscles or bands of tissue that connect muscles to bones (tendons) are stretched or pulled. HOME CARE  Put ice on the injured area.  Put ice in a plastic bag.  Place a towel between your skin and the bag.  Leave the ice on for 15-20 minutes, 03-04 times a day, for the first 2 days.  Raise (elevate) the injured wrist to lessen puffiness (swelling).  Rest the injured wrist for at least 48 hours or as told by your doctor.  Wear a splint, cast, or an elastic wrap as told by your doctor.  Only take medicine as told by your doctor.  Follow up with your doctor as told. This is important. GET HELP RIGHT AWAY IF:   The fingers are puffy, very red, white, or cold and blue.  The fingers lose feeling (numb) or tingle.  The pain gets worse.  It is hard to move the fingers. MAKE SURE YOU:   Understand these instructions.  Will watch your condition.  Will get help right away if you are not doing well or get worse. Document Released: 11/17/2007 Document Revised: 08/23/2011 Document Reviewed: 07/22/2010 [facility] Patient Information 2015 [place], [place]. This information is not intended to replace advice given to you by your health care provider. Make sure you discuss any questions you have with your health care provider.

## 2014-12-24 NOTE — ED Provider Notes (Signed)
CSN: [id number]     Arrival date & time 12/24/14  1054 History   First MD Initiated Contact with Patient 12/24/14 1121     Chief Complaint  Patient presents with  . Multiple Complaints      (Consider location/radiation/quality/duration/timing/severity/associated sxs/prior Treatment) HPI   The patient is complaining of left hand swelling since February, and also right knee pain for 5 years as well as low back pain for 5 years.  Patient's hand has a small bump over the dorsal aspect of the proximal metacarpals/wrist area. It gets worse with movement and palpation.  Patient is also complaining of right knee pain which has been chronic, made worse with walking. She has not seen an orthopedic surgeon about this.  She denies any trauma, swelling, redness, fever or chills. She also has low back pain, bilaterally which is a chronic issue for her. She denies any trauma to her back. Her low back pain is worse with more physical activity. She does not have any radiation of the pain currently. She denies any loss of bladder or bowel function, any weakness tingling or numbness.    Past Medical History  Diagnosis Date  . Back pain   . Thyroid disease    Past Surgical History  Procedure Laterality Date  . Hernia repair    . Knee arthorscopy     No family history on file. History  Substance Use Topics  . Smoking status: Current Some Day Smoker    Types: Cigarettes  . Smokeless tobacco: Never Used  . Alcohol Use: Yes     Comment: occassionally   OB History    No data available     Review of Systems 10 Systems reviewed and are negative for acute change except as noted in the HPI.      Allergies  Latex  Home Medications   Prior to Admission medications   Medication Sig Start Date End Date Taking? Authorizing Provider  loratadine (CLARITIN) 10 MG tablet Take 1 tablet (10 mg total) by mouth daily. (This medicine may be purchased from over there counter at yr local pharmacy): For  allergies 07/30/14  Yes Encarnacion Slates, NP  OVER THE COUNTER MEDICATION Place 1 drop into both eyes daily as needed (irritation and itchiness). Equate   Yes Historical Provider, MD  Vitamin D, Cholecalciferol, 1000 UNITS CAPS Take 1 capsule by mouth daily.   Yes Historical Provider, MD  amLODipine (NORVASC) 5 MG tablet Take 1 tablet (5 mg total) by mouth daily. For high blood pressure Patient not taking: Reported on 12/24/2014 07/30/14   Encarnacion Slates, NP  hydrOXYzine (ATARAX/VISTARIL) 50 MG tablet Take 1 tablet (50 mg total) by mouth 3 (three) times daily as needed (anxiety). Patient not taking: Reported on 12/24/2014 07/30/14   Encarnacion Slates, NP  ipratropium (ATROVENT) 0.06 % nasal spray Place 2 sprays into both nostrils 4 (four) times daily. For allergies Patient not taking: Reported on 12/24/2014 07/30/14   Encarnacion Slates, NP  levothyroxine (SYNTHROID, LEVOTHROID) 50 MCG tablet Take 1 tablet (50 mcg total) by mouth daily before breakfast. For low thyroid function Patient not taking: Reported on 12/24/2014 07/30/14   Encarnacion Slates, NP  nicotine (NICODERM CQ - DOSED IN MG/24 HOURS) 14 mg/24hr patch Place 1 patch (14 mg total) onto the skin daily. For nicotine addiction Patient not taking: Reported on 12/24/2014 07/30/14   Encarnacion Slates, NP  zolpidem (AMBIEN) 5 MG tablet Take 1 tablet (5 mg total) by mouth at  bedtime. For sleep Patient not taking: Reported on 12/24/2014 07/30/14   Encarnacion Slates, NP   BP 158/86 mmHg  Pulse 60  Temp(Src) 98.3 F (36.8 C) (Oral)  Resp 16  SpO2 100% Physical Exam  Constitutional: She is oriented to person, place, and time. She appears well-developed and well-nourished. No distress.  HENT:  Head: Normocephalic and atraumatic.  Right Ear: External ear normal.  Left Ear: External ear normal.  Nose: Nose normal.  Mouth/Throat: Oropharynx is clear and moist. No oropharyngeal exudate.  Eyes: Conjunctivae and EOM are normal. Pupils are equal, round, and reactive to light. Right  eye exhibits no discharge. Left eye exhibits no discharge. No scleral icterus.  Neck: Normal range of motion. Neck supple. No JVD present. No tracheal deviation present.  Cardiovascular: Normal rate and regular rhythm.   Pulmonary/Chest: Effort normal and breath sounds normal. No stridor. No respiratory distress.  Musculoskeletal: Normal range of motion. She exhibits tenderness. She exhibits no edema.       Cervical back: Normal.       Thoracic back: Normal.       Lumbar back: Normal.       Back:  Multiple nodule palpated over the dorsal aspect of the left hand, tender to palpation, no redness no induration or drainage. Normal range of motion of right knee, minimal tenderness to palpation of her MCL area, no redness no edema no obvious effusion, negative anterior drawer test, no instability   Lymphadenopathy:    She has no cervical adenopathy.  Neurological: She is alert and oriented to person, place, and time. She exhibits normal muscle tone. Coordination normal.  Skin: Skin is warm and dry. No rash noted. She is not diaphoretic. No erythema. No pallor.  Psychiatric: She has a normal mood and affect. Her behavior is normal. Judgment and thought content normal.    ED Course  Procedures (including critical care time) Labs Review Labs Reviewed - No data to display  Imaging Review No results found.   EKG Interpretation None      MDM   Final diagnoses:  None   Left hand pain Right knee  Low back pain no sciatic  Patient is a lovely woman with multiple complaints, nontoxic appearing and does not appear to be in acute distress.  I have got a x-ray of her right knee, to evaluate in the osteoarthritic changes, do not suspect any acute pathology.  Ace wrap given for comfort.  Pain meds and sterile given here in the ER.  Patient will be [place] home with rice therapy, ibuprofen and tramadol given, with orthopod follow up if she wishes.  Patient does have a nodule on the dorsal aspect  of her left hand, may be a ganglionic cyst.  Did not feel this cyst necessitated aspiration or imaging at this time, not acutely infected and does not appear to be problematic. Patient was advised to follow up outpatient.  Patient vitals were reviewed and she was discharged home   Delsa Grana, [place] 01/01/15 [place], MD 01/05/15 [id number]

## 2014-12-24 NOTE — ED Notes (Signed)
Pt is c/o lt hand swelling since February.  Also c/o bilateral knee pain x 5 years.  Low back pain x 5 yrs.

## 2016-05-25 ENCOUNTER — Encounter (HOSPITAL_COMMUNITY): Payer: Self-pay | Admitting: Emergency Medicine

## 2016-05-25 ENCOUNTER — Emergency Department (HOSPITAL_COMMUNITY)
Admission: EM | Admit: 2016-05-25 | Discharge: 2016-05-25 | Disposition: A | Discharge status: Home / Self Care | Payer: Medicare Other | Attending: Emergency Medicine | Admitting: Emergency Medicine

## 2016-05-25 ENCOUNTER — Emergency Department (HOSPITAL_COMMUNITY): Payer: Medicare Other

## 2016-05-25 DIAGNOSIS — Y999 Unspecified external cause status: Secondary | ICD-10-CM | POA: Diagnosis not present

## 2016-05-25 DIAGNOSIS — Y92410 Unspecified street and highway as the place of occurrence of the external cause: Secondary | ICD-10-CM | POA: Diagnosis not present

## 2016-05-25 DIAGNOSIS — Y939 Activity, unspecified: Secondary | ICD-10-CM | POA: Diagnosis not present

## 2016-05-25 DIAGNOSIS — E039 Hypothyroidism, unspecified: Secondary | ICD-10-CM | POA: Diagnosis not present

## 2016-05-25 DIAGNOSIS — S161XXA Strain of muscle, fascia and tendon at neck level, initial encounter: Secondary | ICD-10-CM | POA: Diagnosis not present

## 2016-05-25 DIAGNOSIS — F17210 Nicotine dependence, cigarettes, uncomplicated: Secondary | ICD-10-CM | POA: Insufficient documentation

## 2016-05-25 DIAGNOSIS — S199XXA Unspecified injury of neck, initial encounter: Secondary | ICD-10-CM | POA: Diagnosis present

## 2016-05-25 DIAGNOSIS — Z91040 Latex allergy status: Secondary | ICD-10-CM | POA: Diagnosis not present

## 2016-05-25 MED ORDER — CYCLOBENZAPRINE HCL 10 MG PO TABS: 10.0000 mg | ORAL_TABLET | Freq: Every day | ORAL | 0 refills | Status: DC

## 2016-05-25 MED ORDER — IBUPROFEN 800 MG PO TABS: 800.0000 mg | ORAL_TABLET | Freq: Three times a day (TID) | ORAL | 0 refills | Status: DC | PRN

## 2016-05-25 MED ORDER — TRAMADOL HCL 50 MG PO TABS
50.0000 mg | ORAL_TABLET | Freq: Four times a day (QID) | ORAL | 0 refills | Status: DC | PRN
Start: 2016-05-25 — End: 2017-04-25
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
## 2016-05-25 NOTE — ED Notes (Signed)
Patient was alert, oriented and stable upon discharge. RN went over AVS and patient had no further questions.  
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
## 2016-05-25 NOTE — Discharge Instructions (Signed)
Return here as needed.  Follow-up with your primary care doctor.  Use ice and heat on the areas that are sore.  Your x-rays did not show any abnormalities he will expect to be sore over the next 7-14 days

## 2016-08-29 ENCOUNTER — Emergency Department (HOSPITAL_COMMUNITY)
Admission: EM | Admit: 2016-08-29 | Discharge: 2016-08-29 | Disposition: A | Discharge status: Home / Self Care | Payer: Medicare Other | Attending: Emergency Medicine | Admitting: Emergency Medicine

## 2016-08-29 ENCOUNTER — Encounter (HOSPITAL_COMMUNITY): Payer: Self-pay | Admitting: Emergency Medicine

## 2016-08-29 DIAGNOSIS — F17210 Nicotine dependence, cigarettes, uncomplicated: Secondary | ICD-10-CM | POA: Diagnosis not present

## 2016-08-29 DIAGNOSIS — Z79899 Other long term (current) drug therapy: Secondary | ICD-10-CM | POA: Insufficient documentation

## 2016-08-29 DIAGNOSIS — T7840XA Allergy, unspecified, initial encounter: Secondary | ICD-10-CM | POA: Insufficient documentation

## 2016-08-29 DIAGNOSIS — K047 Periapical abscess without sinus: Secondary | ICD-10-CM | POA: Diagnosis not present

## 2016-08-29 DIAGNOSIS — Z91040 Latex allergy status: Secondary | ICD-10-CM | POA: Insufficient documentation

## 2016-08-29 LAB — I-STAT CHEM 8, ED
BUN: 15 mg/dL (ref 6–20)
CALCIUM ION: 1.2 mmol/L (ref 1.15–1.40)
CREATININE: 1.1 mg/dL — AB (ref 0.44–1.00)
Chloride: 102 mmol/L (ref 101–111)
GLUCOSE: 95 mg/dL (ref 65–99)
HCT: 41 % (ref 36.0–46.0)
Hemoglobin: 13.9 g/dL (ref 12.0–15.0)
Potassium: 4 mmol/L (ref 3.5–5.1)
SODIUM: 138 mmol/L (ref 135–145)
TCO2: 26 mmol/L (ref 0–100)

## 2016-08-29 MED ORDER — RANITIDINE HCL 150 MG PO TABS: 150.0000 mg | ORAL_TABLET | Freq: Two times a day (BID) | ORAL | 0 refills | Status: DC

## 2016-08-29 MED ORDER — FAMOTIDINE IN NACL 20-0.9 MG/50ML-% IV SOLN
20.0000 mg | Freq: Once | INTRAVENOUS | Status: AC
Start: 2016-08-29 — End: 2016-08-29
  Administered 2016-08-29: 20 mg via INTRAVENOUS
  Filled 2016-08-29: qty 50

## 2016-08-29 MED ORDER — METHYLPREDNISOLONE SODIUM SUCC 125 MG IJ SOLR
125.0000 mg | Freq: Once | INTRAMUSCULAR | Status: AC
  Administered 2016-08-29: 125 mg via INTRAVENOUS
  Filled 2016-08-29: qty 2

## 2016-08-29 MED ORDER — DIPHENHYDRAMINE HCL 50 MG/ML IJ SOLN
50.0000 mg | Freq: Once | INTRAMUSCULAR | Status: AC
  Administered 2016-08-29: 50 mg via INTRAVENOUS

## 2016-08-29 MED ORDER — DIPHENHYDRAMINE HCL 50 MG/ML IJ SOLN
INTRAMUSCULAR | Status: AC
  Administered 2016-08-29: 50 mg via INTRAVENOUS
  Filled 2016-08-29: qty 1

## 2016-08-29 MED ORDER — CLINDAMYCIN HCL 300 MG PO CAPS: 300.0000 mg | ORAL_CAPSULE | Freq: Three times a day (TID) | ORAL | 0 refills | Status: AC

## 2016-08-29 MED ORDER — CLINDAMYCIN PHOSPHATE 900 MG/50ML IV SOLN
900.0000 mg | Freq: Once | INTRAVENOUS | Status: AC
  Administered 2016-08-29: 900 mg via INTRAVENOUS
  Filled 2016-08-29: qty 50

## 2016-08-29 MED ORDER — PREDNISONE 20 MG PO TABS: 60.0000 mg | ORAL_TABLET | Freq: Every day | ORAL | 0 refills | Status: AC

## 2016-08-29 MED ORDER — DIPHENHYDRAMINE HCL 25 MG PO TABS: 25.0000 mg | ORAL_TABLET | Freq: Four times a day (QID) | ORAL | 0 refills | Status: DC | PRN

## 2016-08-29 NOTE — ED Provider Notes (Signed)
[facility] DEPT Provider Note   CSN: [id number] Arrival date & time: 08/29/16  1708     History   Chief Complaint Chief Complaint  Patient presents with  . Allergic Reaction    HPI Jamie Hicks is a 61 y.o. female.  HPI  61 yo F with PMHx as below here with allergic reaction. Pt was started on amox 3 days ago by her dentist for right upper tooth infection. Since then, pt has developed mild lip swelling. She has also had worsening right upper tooth pain. Earlier today, she felt like her lip swelling worsened. She did not have any benadryl/meds so she presents for evaluation. No h/o anaphylaxis. Currently, she endorses aching, throbbing right upper tooth pain and lip swelling but no tongue swelling. No change in voice. No rash, itching, SOB, wheezing, or other complaints. No other new medications. No chest pain.   Past Medical History:  Diagnosis Date  . Back pain   . Thyroid disease     Patient Active Problem List   Diagnosis Date Noted  . Thyroid activity decreased   . Cocaine dependence with cocaine-induced mood disorder ([place]) 07/22/2014  . Major depressive disorder, recurrent, severe without psychotic features ([place])   . Substance induced mood disorder ([place]) 07/19/2014  . Suicidal ideation   . Homicidal ideation   . Osteoarthritis 09/24/2012  . Prediabetes 09/24/2012  . Dyslipidemia 09/24/2012  . Vitamin D insufficiency 09/24/2012  . Hypothyroidism 09/22/2012  . Bilateral chronic knee pain 09/22/2012  . Smoker 09/22/2012  . Elevated blood pressure 09/22/2012    Past Surgical History:  Procedure Laterality Date  . HERNIA REPAIR    . knee arthorscopy      OB History    No data available       Home Medications    Prior to Admission medications   Medication Sig Start Date End Date Taking? Authorizing Provider  diclofenac (VOLTAREN) 75 MG EC tablet Take 75 mg by mouth 2 (two) times daily. 07/10/16  Yes Historical Provider, MD  levothyroxine  (SYNTHROID, LEVOTHROID) 75 MCG tablet Take 75 mcg by mouth every morning. 05/29/16  Yes Historical Provider, MD  traMADol (ULTRAM) 50 MG tablet Take 1 tablet (50 mg total) by mouth every 6 (six) hours as needed for severe pain. 05/25/16  Yes Christopher Lawyer, PA-C  Vitamin D, Cholecalciferol, 1000 UNITS CAPS Take 1 capsule by mouth daily.   Yes Historical Provider, MD  amLODipine (NORVASC) 5 MG tablet Take 1 tablet (5 mg total) by mouth daily. For high blood pressure Patient not taking: Reported on 12/24/2014 07/30/14   Encarnacion Slates, NP  clindamycin (CLEOCIN) 300 MG capsule Take 1 capsule (300 mg total) by mouth 3 (three) times daily. 08/29/16 09/05/16  Duffy Bruce, MD  cyclobenzaprine (FLEXERIL) 10 MG tablet Take 1 tablet (10 mg total) by mouth at bedtime. Patient not taking: Reported on 08/29/2016 05/25/16   Dalia Heading, PA-C  diphenhydrAMINE (BENADRYL) 25 MG tablet Take 1 tablet (25 mg total) by mouth every 6 (six) hours as needed. 08/29/16 09/03/16  Duffy Bruce, MD  hydrOXYzine (ATARAX/VISTARIL) 50 MG tablet Take 1 tablet (50 mg total) by mouth 3 (three) times daily as needed (anxiety). Patient not taking: Reported on 12/24/2014 07/30/14   Encarnacion Slates, NP  ibuprofen (ADVIL,MOTRIN) 800 MG tablet Take 1 tablet (800 mg total) by mouth every 8 (eight) hours as needed. Patient not taking: Reported on 08/29/2016 05/25/16   Dalia Heading, PA-C  ipratropium (ATROVENT) 0.06 % nasal spray Place  2 sprays into both nostrils 4 (four) times daily. For allergies Patient not taking: Reported on 12/24/2014 07/30/14   Encarnacion Slates, NP  levothyroxine (SYNTHROID, LEVOTHROID) 50 MCG tablet Take 1 tablet (50 mcg total) by mouth daily before breakfast. For low thyroid function Patient not taking: Reported on 08/29/2016 07/30/14   Encarnacion Slates, NP  loratadine (CLARITIN) 10 MG tablet Take 1 tablet (10 mg total) by mouth daily. (This medicine may be purchased from over there counter at yr local pharmacy): For  allergies Patient not taking: Reported on 08/29/2016 07/30/14   Encarnacion Slates, NP  nicotine (NICODERM CQ - DOSED IN MG/24 HOURS) 14 mg/24hr patch Place 1 patch (14 mg total) onto the skin daily. For nicotine addiction Patient not taking: Reported on 12/24/2014 07/30/14   Encarnacion Slates, NP  predniSONE (DELTASONE) 20 MG tablet Take 3 tablets (60 mg total) by mouth daily. 08/29/16 09/03/16  Duffy Bruce, MD  ranitidine (ZANTAC) 150 MG tablet Take 1 tablet (150 mg total) by mouth 2 (two) times daily. 08/29/16 09/03/16  Duffy Bruce, MD  zolpidem (AMBIEN) 5 MG tablet Take 1 tablet (5 mg total) by mouth at bedtime. For sleep Patient not taking: Reported on 12/24/2014 07/30/14   Encarnacion Slates, NP    Family History No family history on file.  Social History Social History  Substance Use Topics  . Smoking status: Current Some Day Smoker    Types: Cigarettes  . Smokeless tobacco: Never Used  . Alcohol use Yes     Comment: occassionally     Allergies   Amoxicillin and Latex   Review of Systems Review of Systems  Constitutional: Negative for chills and fever.  HENT: Positive for dental problem and facial swelling. Negative for congestion, rhinorrhea and sore throat.   Eyes: Negative for visual disturbance.  Respiratory: Negative for cough, shortness of breath and wheezing.   Cardiovascular: Negative for chest pain and leg swelling.  Gastrointestinal: Negative for abdominal pain, diarrhea, nausea and vomiting.  Genitourinary: Negative for dysuria, flank pain, vaginal bleeding and vaginal discharge.  Musculoskeletal: Negative for neck pain.  Skin: Negative for rash.  Allergic/Immunologic: Negative for immunocompromised state.  Neurological: Negative for syncope and headaches.  Hematological: Does not bruise/bleed easily.  All other systems reviewed and are negative.    Physical Exam Updated Vital Signs BP (!) 174/104   Pulse 68   Temp 98.6 F (37 C) (Oral)   Resp (!) 30   Ht 5\' 9"   (1.753 m)   Wt 249 lb (112.9 kg)   LMP 07/21/2014 Comment: current  SpO2 95%   BMI 36.77 kg/m   Physical Exam  Constitutional: She is oriented to person, place, and time. She appears well-developed and well-nourished. No distress.  HENT:  Head: Normocephalic and atraumatic.  Markedly poor dentition. There is TTP over upper right premolars with no apparent gingival edema or abscess. No purulence. Tongue normal w/o swelling. No sublingual swelling or edema. Phonation normal. Minimal, subjective edema of lips. Uvula midline and non-edematous.  Eyes: Conjunctivae are normal.  Neck: Neck supple.  Cardiovascular: Normal rate, regular rhythm and normal heart sounds.  Exam reveals no friction rub.   No murmur heard. Pulmonary/Chest: Effort normal and breath sounds normal. No stridor. No respiratory distress. She has no wheezes. She has no rales.  Abdominal: She exhibits no distension.  Musculoskeletal: She exhibits no edema.  Neurological: She is alert and oriented to person, place, and time. She exhibits normal muscle tone.  Skin:  Skin is warm. Capillary refill takes less than 2 seconds.  Psychiatric: She has a normal mood and affect.  Nursing note and vitals reviewed.    ED Treatments / Results  Labs (all labs ordered are listed, but only abnormal results are displayed) Labs Reviewed  I-STAT CHEM 8, ED - Abnormal; Notable for the following:       Result Value   Creatinine, Ser 1.10 (*)    All other components within normal limits    EKG  EKG Interpretation  Date/Time:  Sunday August 29 2016 17:22:12 EDT Ventricular Rate:  75 PR Interval:    QRS Duration: 109 QT Interval:  446 QTC Calculation: 499 R Axis:   10 Text Interpretation:  Unable to interpret due to artifact Confirmed by Zykee Avakian MD, Lysbeth Galas [phone]) on 08/29/2016 6:29:53 PM       Radiology No results found.  Procedures Procedures (including critical care time)  Medications Ordered in ED Medications    clindamycin (CLEOCIN) IVPB 900 mg (0 mg Intravenous Stopped 08/29/16 1933)  diphenhydrAMINE (BENADRYL) injection 50 mg (50 mg Intravenous Given 08/29/16 1728)  methylPREDNISolone sodium succinate (SOLU-MEDROL) 125 mg/2 mL injection 125 mg (125 mg Intravenous Given 08/29/16 1740)  famotidine (PEPCID) IVPB 20 mg premix (0 mg Intravenous Stopped 08/29/16 1811)     Initial Impression / Assessment and Plan / ED Course  I have reviewed the triage vital signs and the nursing notes.  Pertinent labs & imaging results that were available during my care of the patient were reviewed by me and considered in my medical decision making (see chart for details).    61 yo F with extensive PMHx as above here with facial swelling after taking amox for dental infection. On exam, pt has mild lip swelling, no other evidence of lingual or mucosal swelling. Her dentition is markedly poor but she has no signs of gingival abscess. She has NO sublingual swelling, induration, edema, or signs of Ludwig's. It is unclear whether this is allergic rxn to amox versus ongoing dental infection. No facial swelling to suggest abscess. Will treat for allergic rxn, monitor in ED. No fever or signs fo systemic infection. No urticaria, nausea, vomiting, difficulty breathing, wheezing, ro signs of anaphylaxis. Not on ACE.  Pt lip swelling resolved, no recurrence in ED. No rash, wheezing, difficulty swallowing, or signs of recurrent allergic rxn. Will switch to clinda, place on steroids/anthistamines, and d/c home.  Final Clinical Impressions(s) / ED Diagnoses   Final diagnoses:  Allergic reaction, initial encounter  Dental infection    New Prescriptions Discharge Medication List as of 08/29/2016  7:56 PM    START taking these medications   Details  clindamycin (CLEOCIN) 300 MG capsule Take 1 capsule (300 mg total) by mouth 3 (three) times daily., Starting Sun 08/29/2016, Until Sun 09/05/2016, Print    diphenhydrAMINE (BENADRYL) 25 MG  tablet Take 1 tablet (25 mg total) by mouth every 6 (six) hours as needed., Starting Sun 08/29/2016, Until Fri 09/03/2016, Print    predniSONE (DELTASONE) 20 MG tablet Take 3 tablets (60 mg total) by mouth daily., Starting Sun 08/29/2016, Until Fri 09/03/2016, Print    ranitidine (ZANTAC) 150 MG tablet Take 1 tablet (150 mg total) by mouth 2 (two) times daily., Starting Sun 08/29/2016, Until Fri 09/03/2016, Print         Duffy Bruce, MD 08/30/16 [phone]

## 2016-08-29 NOTE — ED Triage Notes (Addendum)
Pt from home with allergic reaction with facial swelling. Pt states she has been taking amoxicillin x 3 days. Pt has oral swelling but clear lung sounds. Pt states swelling began last night.

## 2017-04-18 ENCOUNTER — Emergency Department (HOSPITAL_COMMUNITY): Payer: 59

## 2017-04-18 ENCOUNTER — Encounter (HOSPITAL_COMMUNITY): Payer: Self-pay | Admitting: Nurse Practitioner

## 2017-04-18 ENCOUNTER — Other Ambulatory Visit: Payer: Self-pay

## 2017-04-18 ENCOUNTER — Inpatient Hospital Stay (HOSPITAL_COMMUNITY)
Admission: EM | Admit: 2017-04-18 | Discharge: 2017-04-25 | DRG: 208 | Disposition: A | Discharge status: Skilled Nursing Facility | Payer: 59 | Attending: Internal Medicine | Admitting: Internal Medicine

## 2017-04-18 DIAGNOSIS — J1008 Influenza due to other identified influenza virus with other specified pneumonia: Secondary | ICD-10-CM | POA: Diagnosis present

## 2017-04-18 DIAGNOSIS — Z6835 Body mass index (BMI) 35.0-35.9, adult: Secondary | ICD-10-CM

## 2017-04-18 DIAGNOSIS — J80 Acute respiratory distress syndrome: Secondary | ICD-10-CM | POA: Diagnosis not present

## 2017-04-18 DIAGNOSIS — J189 Pneumonia, unspecified organism: Secondary | ICD-10-CM

## 2017-04-18 DIAGNOSIS — R0603 Acute respiratory distress: Secondary | ICD-10-CM

## 2017-04-18 DIAGNOSIS — K219 Gastro-esophageal reflux disease without esophagitis: Secondary | ICD-10-CM | POA: Diagnosis present

## 2017-04-18 DIAGNOSIS — A419 Sepsis, unspecified organism: Secondary | ICD-10-CM | POA: Diagnosis not present

## 2017-04-18 DIAGNOSIS — E661 Drug-induced obesity: Secondary | ICD-10-CM

## 2017-04-18 DIAGNOSIS — J1000 Influenza due to other identified influenza virus with unspecified type of pneumonia: Secondary | ICD-10-CM | POA: Diagnosis not present

## 2017-04-18 DIAGNOSIS — Z781 Physical restraint status: Secondary | ICD-10-CM

## 2017-04-18 DIAGNOSIS — Z4659 Encounter for fitting and adjustment of other gastrointestinal appliance and device: Secondary | ICD-10-CM

## 2017-04-18 DIAGNOSIS — I5021 Acute systolic (congestive) heart failure: Secondary | ICD-10-CM | POA: Insufficient documentation

## 2017-04-18 DIAGNOSIS — R0902 Hypoxemia: Secondary | ICD-10-CM | POA: Diagnosis not present

## 2017-04-18 DIAGNOSIS — E876 Hypokalemia: Secondary | ICD-10-CM | POA: Diagnosis not present

## 2017-04-18 DIAGNOSIS — J440 Chronic obstructive pulmonary disease with acute lower respiratory infection: Secondary | ICD-10-CM | POA: Diagnosis present

## 2017-04-18 DIAGNOSIS — T424X5A Adverse effect of benzodiazepines, initial encounter: Secondary | ICD-10-CM | POA: Diagnosis not present

## 2017-04-18 DIAGNOSIS — Y92239 Unspecified place in hospital as the place of occurrence of the external cause: Secondary | ICD-10-CM | POA: Diagnosis present

## 2017-04-18 DIAGNOSIS — J9601 Acute respiratory failure with hypoxia: Secondary | ICD-10-CM

## 2017-04-18 DIAGNOSIS — F1010 Alcohol abuse, uncomplicated: Secondary | ICD-10-CM | POA: Diagnosis present

## 2017-04-18 DIAGNOSIS — Z79890 Hormone replacement therapy (postmenopausal): Secondary | ICD-10-CM

## 2017-04-18 DIAGNOSIS — I10 Essential (primary) hypertension: Secondary | ICD-10-CM | POA: Diagnosis not present

## 2017-04-18 DIAGNOSIS — I5023 Acute on chronic systolic (congestive) heart failure: Secondary | ICD-10-CM | POA: Diagnosis present

## 2017-04-18 DIAGNOSIS — Z0189 Encounter for other specified special examinations: Secondary | ICD-10-CM

## 2017-04-18 DIAGNOSIS — N179 Acute kidney failure, unspecified: Secondary | ICD-10-CM | POA: Diagnosis not present

## 2017-04-18 DIAGNOSIS — J101 Influenza due to other identified influenza virus with other respiratory manifestations: Secondary | ICD-10-CM | POA: Diagnosis present

## 2017-04-18 DIAGNOSIS — R0602 Shortness of breath: Secondary | ICD-10-CM

## 2017-04-18 DIAGNOSIS — G9340 Encephalopathy, unspecified: Secondary | ICD-10-CM | POA: Diagnosis not present

## 2017-04-18 DIAGNOSIS — J181 Lobar pneumonia, unspecified organism: Principal | ICD-10-CM | POA: Diagnosis present

## 2017-04-18 DIAGNOSIS — Z880 Allergy status to penicillin: Secondary | ICD-10-CM

## 2017-04-18 DIAGNOSIS — D649 Anemia, unspecified: Secondary | ICD-10-CM | POA: Diagnosis present

## 2017-04-18 DIAGNOSIS — R791 Abnormal coagulation profile: Secondary | ICD-10-CM | POA: Diagnosis present

## 2017-04-18 DIAGNOSIS — E66813 Obesity, class 3: Secondary | ICD-10-CM | POA: Diagnosis present

## 2017-04-18 DIAGNOSIS — E871 Hypo-osmolality and hyponatremia: Secondary | ICD-10-CM | POA: Diagnosis not present

## 2017-04-18 DIAGNOSIS — R6520 Severe sepsis without septic shock: Secondary | ICD-10-CM | POA: Diagnosis not present

## 2017-04-18 DIAGNOSIS — Z978 Presence of other specified devices: Secondary | ICD-10-CM

## 2017-04-18 DIAGNOSIS — I110 Hypertensive heart disease with heart failure: Secondary | ICD-10-CM | POA: Diagnosis present

## 2017-04-18 DIAGNOSIS — F17210 Nicotine dependence, cigarettes, uncomplicated: Secondary | ICD-10-CM | POA: Diagnosis present

## 2017-04-18 DIAGNOSIS — I160 Hypertensive urgency: Secondary | ICD-10-CM | POA: Diagnosis not present

## 2017-04-18 DIAGNOSIS — E039 Hypothyroidism, unspecified: Secondary | ICD-10-CM | POA: Diagnosis present

## 2017-04-18 DIAGNOSIS — G92 Toxic encephalopathy: Secondary | ICD-10-CM | POA: Diagnosis not present

## 2017-04-18 DIAGNOSIS — F1410 Cocaine abuse, uncomplicated: Secondary | ICD-10-CM | POA: Diagnosis present

## 2017-04-18 DIAGNOSIS — E669 Obesity, unspecified: Secondary | ICD-10-CM | POA: Diagnosis not present

## 2017-04-18 DIAGNOSIS — J159 Unspecified bacterial pneumonia: Secondary | ICD-10-CM | POA: Diagnosis present

## 2017-04-18 DIAGNOSIS — L299 Pruritus, unspecified: Secondary | ICD-10-CM | POA: Diagnosis not present

## 2017-04-18 DIAGNOSIS — Z91040 Latex allergy status: Secondary | ICD-10-CM

## 2017-04-18 DIAGNOSIS — F332 Major depressive disorder, recurrent severe without psychotic features: Secondary | ICD-10-CM | POA: Diagnosis present

## 2017-04-18 DIAGNOSIS — I340 Nonrheumatic mitral (valve) insufficiency: Secondary | ICD-10-CM | POA: Diagnosis not present

## 2017-04-18 DIAGNOSIS — J9602 Acute respiratory failure with hypercapnia: Secondary | ICD-10-CM | POA: Diagnosis not present

## 2017-04-18 HISTORY — DX: Essential (primary) hypertension: I10

## 2017-04-18 LAB — COMPREHENSIVE METABOLIC PANEL
ALT: 17 U/L (ref 14–54)
ANION GAP: 10 (ref 5–15)
AST: 21 U/L (ref 15–41)
Albumin: 4.2 g/dL (ref 3.5–5.0)
Alkaline Phosphatase: 76 U/L (ref 38–126)
BILIRUBIN TOTAL: 0.7 mg/dL (ref 0.3–1.2)
BUN: 17 mg/dL (ref 6–20)
CO2: 25 mmol/L (ref 22–32)
Calcium: 8.9 mg/dL (ref 8.9–10.3)
Chloride: 101 mmol/L (ref 101–111)
Creatinine, Ser: 0.97 mg/dL (ref 0.44–1.00)
GFR calc Af Amer: >60 mL/min (ref >60)
Glucose, Bld: 104 mg/dL — ABNORMAL HIGH (ref 65–99)
Potassium: 3.9 mmol/L (ref 3.5–5.1)
Sodium: 136 mmol/L (ref 135–145)
TOTAL PROTEIN: 8.2 g/dL — AB (ref 6.5–8.1)

## 2017-04-18 LAB — CBC WITH DIFFERENTIAL/PLATELET
BASOS ABS: 0 10*3/uL (ref 0.0–0.1)
BASOS PCT: 0 %
EOS PCT: 0 %
Eosinophils Absolute: 0 10*3/uL (ref 0.0–0.7)
HCT: 34 % — ABNORMAL LOW (ref 36.0–46.0)
Hemoglobin: 10.9 g/dL — ABNORMAL LOW (ref 12.0–15.0)
Lymphocytes Relative: 5 %
Lymphs Abs: 0.4 10*3/uL — ABNORMAL LOW (ref 0.7–4.0)
MCH: 27 pg (ref 26.0–34.0)
MCHC: 32.1 g/dL (ref 30.0–36.0)
MCV: 84.2 fL (ref 78.0–100.0)
Monocytes Absolute: 0.2 10*3/uL (ref 0.1–1.0)
Monocytes Relative: 2 %
NEUTROS ABS: 8.5 10*3/uL — AB (ref 1.7–7.7)
Neutrophils Relative %: 93 %
Platelets: 178 10*3/uL (ref 150–400)
RBC: 4.04 MIL/uL (ref 3.87–5.11)
RDW: 15.4 % (ref 11.5–15.5)
WBC: 9.2 10*3/uL (ref 4.0–10.5)

## 2017-04-18 LAB — INFLUENZA PANEL BY PCR (TYPE A & B)
INFLBPCR: NEGATIVE
Influenza A By PCR: POSITIVE — AB

## 2017-04-18 LAB — D-DIMER, QUANTITATIVE: D-Dimer, Quant: 1 ug/mL-FEU — ABNORMAL HIGH (ref 0.00–0.50)

## 2017-04-18 LAB — TSH: TSH: 22.555 u[IU]/mL — AB (ref 0.350–4.500)

## 2017-04-18 MED ORDER — DEXTROSE 5 % IV SOLN
1.0000 g | INTRAVENOUS | Status: DC
  Administered 2017-04-19: 1 g via INTRAVENOUS
  Filled 2017-04-18: qty 10

## 2017-04-18 MED ORDER — LORATADINE 10 MG PO TABS
10.0000 mg | ORAL_TABLET | Freq: Every day | ORAL | Status: DC
  Administered 2017-04-18 – 2017-04-20 (×3): 10 mg via ORAL
  Filled 2017-04-18 (×3): qty 1

## 2017-04-18 MED ORDER — SODIUM CHLORIDE 0.9% FLUSH
3.0000 mL | Freq: Two times a day (BID) | INTRAVENOUS | Status: DC
  Administered 2017-04-18 – 2017-04-25 (×12): 3 mL via INTRAVENOUS

## 2017-04-18 MED ORDER — BUTALBITAL-APAP-CAFFEINE 50-325-40 MG PO TABS
2.0000 | ORAL_TABLET | Freq: Once | ORAL | Status: AC
  Administered 2017-04-19: 2 via ORAL
  Filled 2017-04-18: qty 2

## 2017-04-18 MED ORDER — DEXTROSE 5 % IV SOLN
1.0000 g | Freq: Once | INTRAVENOUS | Status: AC
  Administered 2017-04-18: 1 g via INTRAVENOUS
  Filled 2017-04-18: qty 10

## 2017-04-18 MED ORDER — IPRATROPIUM-ALBUTEROL 0.5-2.5 (3) MG/3ML IN SOLN
3.0000 mL | Freq: Four times a day (QID) | RESPIRATORY_TRACT | Status: DC
  Administered 2017-04-18 – 2017-04-23 (×20): 3 mL via RESPIRATORY_TRACT
  Filled 2017-04-18 (×20): qty 3

## 2017-04-18 MED ORDER — DEXTROSE 5 % IV SOLN
500.0000 mg | Freq: Once | INTRAVENOUS | Status: AC
  Administered 2017-04-18: 500 mg via INTRAVENOUS
  Filled 2017-04-18: qty 500

## 2017-04-18 MED ORDER — IOPAMIDOL (ISOVUE-370) INJECTION 76%
INTRAVENOUS | Status: AC
  Administered 2017-04-18: 100 mL via INTRAVENOUS
  Filled 2017-04-18: qty 100

## 2017-04-18 MED ORDER — ALBUTEROL SULFATE (2.5 MG/3ML) 0.083% IN NEBU
5.0000 mg | INHALATION_SOLUTION | Freq: Once | RESPIRATORY_TRACT | Status: AC
  Administered 2017-04-18: 5 mg via RESPIRATORY_TRACT
  Filled 2017-04-18: qty 6

## 2017-04-18 MED ORDER — HYDRALAZINE HCL 20 MG/ML IJ SOLN: 10.0000 mg | Freq: Three times a day (TID) | INTRAMUSCULAR | Status: DC | PRN

## 2017-04-18 MED ORDER — IBUPROFEN 200 MG PO TABS
600.0000 mg | ORAL_TABLET | Freq: Once | ORAL | Status: AC
  Administered 2017-04-18: 600 mg via ORAL
  Filled 2017-04-18: qty 3

## 2017-04-18 MED ORDER — CYCLOBENZAPRINE HCL 5 MG PO TABS: 5.0000 mg | ORAL_TABLET | Freq: Once | ORAL | Status: DC | PRN

## 2017-04-18 MED ORDER — AMLODIPINE BESYLATE 5 MG PO TABS
5.0000 mg | ORAL_TABLET | Freq: Every day | ORAL | Status: DC
  Administered 2017-04-18 – 2017-04-19 (×2): 5 mg via ORAL
  Filled 2017-04-18 (×2): qty 1

## 2017-04-18 MED ORDER — OSELTAMIVIR PHOSPHATE 75 MG PO CAPS
75.0000 mg | ORAL_CAPSULE | Freq: Two times a day (BID) | ORAL | Status: DC
  Administered 2017-04-18 – 2017-04-19 (×2): 75 mg via ORAL
  Filled 2017-04-18 (×4): qty 1

## 2017-04-18 MED ORDER — IOPAMIDOL (ISOVUE-370) INJECTION 76%
100.0000 mL | Freq: Once | INTRAVENOUS | Status: AC | PRN
  Administered 2017-04-18: 100 mL via INTRAVENOUS

## 2017-04-18 MED ORDER — DEXTROSE 5 % IV SOLN
500.0000 mg | INTRAVENOUS | Status: DC
  Administered 2017-04-19: 500 mg via INTRAVENOUS
  Filled 2017-04-18: qty 500

## 2017-04-18 MED ORDER — TRAMADOL HCL 50 MG PO TABS
50.0000 mg | ORAL_TABLET | Freq: Four times a day (QID) | ORAL | Status: DC | PRN
  Administered 2017-04-18 – 2017-04-19 (×2): 50 mg via ORAL
  Filled 2017-04-18 (×3): qty 1

## 2017-04-18 MED ORDER — SODIUM CHLORIDE 0.9 % IV SOLN: 250.0000 mL | INTRAVENOUS | Status: DC | PRN

## 2017-04-18 MED ORDER — ACETAMINOPHEN 500 MG PO TABS
1000.0000 mg | ORAL_TABLET | Freq: Once | ORAL | Status: AC
  Administered 2017-04-18: 1000 mg via ORAL
  Filled 2017-04-18: qty 2

## 2017-04-18 MED ORDER — NICOTINE 14 MG/24HR TD PT24
14.0000 mg | MEDICATED_PATCH | Freq: Every day | TRANSDERMAL | Status: DC
  Administered 2017-04-20 – 2017-04-24 (×3): 14 mg via TRANSDERMAL
  Filled 2017-04-18 (×6): qty 1

## 2017-04-18 MED ORDER — OSELTAMIVIR PHOSPHATE 75 MG PO CAPS
75.0000 mg | ORAL_CAPSULE | Freq: Once | ORAL | Status: AC
  Administered 2017-04-18: 75 mg via ORAL
  Filled 2017-04-18: qty 1

## 2017-04-18 MED ORDER — SODIUM CHLORIDE 0.9% FLUSH: 3.0000 mL | INTRAVENOUS | Status: DC | PRN

## 2017-04-18 MED ORDER — HEPARIN SODIUM (PORCINE) 5000 UNIT/ML IJ SOLN
5000.0000 [IU] | Freq: Three times a day (TID) | INTRAMUSCULAR | Status: DC
  Administered 2017-04-18 – 2017-04-25 (×20): 5000 [IU] via SUBCUTANEOUS
  Filled 2017-04-18 (×19): qty 1

## 2017-04-18 MED ORDER — PANTOPRAZOLE SODIUM 40 MG PO TBEC
40.0000 mg | DELAYED_RELEASE_TABLET | Freq: Every day | ORAL | Status: DC
  Administered 2017-04-19: 40 mg via ORAL
  Filled 2017-04-18: qty 1

## 2017-04-18 MED ORDER — DM-GUAIFENESIN ER 30-600 MG PO TB12
1.0000 | ORAL_TABLET | Freq: Two times a day (BID) | ORAL | Status: DC
  Administered 2017-04-18 – 2017-04-25 (×9): 1 via ORAL
  Filled 2017-04-18 (×10): qty 1

## 2017-04-18 MED ORDER — LEVOTHYROXINE SODIUM 75 MCG PO TABS
75.0000 ug | ORAL_TABLET | Freq: Every day | ORAL | Status: DC
  Administered 2017-04-19 – 2017-04-20 (×2): 75 ug via ORAL
  Filled 2017-04-18 (×2): qty 1

## 2017-04-18 MED ORDER — SODIUM CHLORIDE 0.9 % IV BOLUS (SEPSIS)
1000.0000 mL | Freq: Once | INTRAVENOUS | Status: AC
  Administered 2017-04-18: 1000 mL via INTRAVENOUS

## 2017-04-18 MED ORDER — SODIUM CHLORIDE 0.9 % IV SOLN
INTRAVENOUS | Status: DC
  Administered 2017-04-18: 21:00:00 via INTRAVENOUS
  Administered 2017-04-19: 1000 mL via INTRAVENOUS

## 2017-04-18 NOTE — H&P (Signed)
History and Physical    Jamie Hicks GLO:[id number] DOB: 08/12/1955 DOA: 04/18/2017  PCP: Patient, No Pcp Per   I have briefly reviewed patients previous medical reports in [facility].  Patient coming from: home  Chief Complaint: General malaise, fever, cough and shortness of breath  HPI: Jamie Hicks is a 61 year old female with a past medical history significant for hypertension, tobacco abuse, morbid obesity, hypothyroidism, depression and history of medication noncompliance; who presented to the hospital with about 3 days of general malaise, body aches, fever/chills, nonproductive cough and shortness of breath.  Patient denies any sick contacts, reported no flu shots this year.  She denies chest pain, hemoptysis, nausea/vomiting, hematemesis, dysuria, hematochezia or melena.  She presented to the ED for further evaluation and treatment.  ED Course: Patient found to be tachycardic and with significant elevation in her temperature.  Workup demonstrated an elevated d-dimer, and sinus tachycardia.  Given elevated d-dimer a CT Angie was done demonstrating no pulmonary embolism and positive left upper lobe infiltrates.  Patient had influenza by PCR which came back to be positive for influenza A.  Patient receive IV fluids, blood cultures were taken, started on IV antibiotics and Tamiflu.  Review of Systems:  All other systems reviewed and apart from HPI, are negative.  Past Medical History:  Diagnosis Date  . Back pain   . Hypertension   . Thyroid disease     Past Surgical History:  Procedure Laterality Date  . HERNIA REPAIR    . knee arthorscopy      Social History  reports that she has been smoking cigarettes.  she has never used smokeless tobacco. She reports that she drinks alcohol. She reports that she does not use drugs.  Allergies  Allergen Reactions  . Amoxicillin     Has patient had a PCN reaction causing immediate rash, facial/tongue/throat swelling,  SOB or lightheadedness with hypotension: yes Has patient had a PCN reaction causing severe rash involving mucus membranes or skin necrosis: no Has patient had a PCN reaction that required hospitalization: yes  Has patient had a PCN reaction occurring within the last 10 years: yes, 08/29/16 If all of the above answers are "NO", then may proceed with Cephalosporin use.   . Latex Rash    History reviewed. No pertinent family history.   Prior to Admission medications   Medication Sig Start Date End Date Taking? Authorizing Provider  levothyroxine (SYNTHROID, LEVOTHROID) 75 MCG tablet Take 75 mcg by mouth every morning. 05/29/16  Yes [provider]  amLODipine (NORVASC) 5 MG tablet Take 1 tablet (5 mg total) by mouth daily. For high blood pressure Patient not taking: Reported on 12/24/2014 07/30/14   Lindell Spar I, NP  cyclobenzaprine (FLEXERIL) 10 MG tablet Take 1 tablet (10 mg total) by mouth at bedtime. Patient not taking: Reported on 08/29/2016 05/25/16   Dalia Heading, PA-C  diphenhydrAMINE (BENADRYL) 25 MG tablet Take 1 tablet (25 mg total) by mouth every 6 (six) hours as needed. 08/29/16 09/03/16  Duffy Bruce, MD  hydrOXYzine (ATARAX/VISTARIL) 50 MG tablet Take 1 tablet (50 mg total) by mouth 3 (three) times daily as needed (anxiety). Patient not taking: Reported on 12/24/2014 07/30/14   Lindell Spar I, NP  ibuprofen (ADVIL,MOTRIN) 800 MG tablet Take 1 tablet (800 mg total) by mouth every 8 (eight) hours as needed. Patient not taking: Reported on 08/29/2016 05/25/16   Dalia Heading, PA-C  ipratropium (ATROVENT) 0.06 % nasal spray Place 2 sprays into both nostrils  4 (four) times daily. For allergies Patient not taking: Reported on 12/24/2014 07/30/14   Lindell Spar I, NP  levothyroxine (SYNTHROID, LEVOTHROID) 50 MCG tablet Take 1 tablet (50 mcg total) by mouth daily before breakfast. For low thyroid function Patient not taking: Reported on 04/18/2017 07/30/14   Lindell Spar  I, NP  loratadine (CLARITIN) 10 MG tablet Take 1 tablet (10 mg total) by mouth daily. (This medicine may be purchased from over there counter at yr local pharmacy): For allergies Patient not taking: Reported on 08/29/2016 07/30/14   Lindell Spar I, NP  nicotine (NICODERM CQ - DOSED IN MG/24 HOURS) 14 mg/24hr patch Place 1 patch (14 mg total) onto the skin daily. For nicotine addiction Patient not taking: Reported on 12/24/2014 07/30/14   Lindell Spar I, NP  ranitidine (ZANTAC) 150 MG tablet Take 1 tablet (150 mg total) by mouth 2 (two) times daily. Patient not taking: Reported on 04/18/2017 08/29/16 09/03/16  Duffy Bruce, MD  traMADol (ULTRAM) 50 MG tablet Take 1 tablet (50 mg total) by mouth every 6 (six) hours as needed for severe pain. Patient not taking: Reported on 04/18/2017 05/25/16   Dalia Heading, PA-C  zolpidem (AMBIEN) 5 MG tablet Take 1 tablet (5 mg total) by mouth at bedtime. For sleep Patient not taking: Reported on 12/24/2014 07/30/14   Lindell Spar I, NP    Physical Exam: Vitals:   04/18/17 1509 04/18/17 1645 04/18/17 1700 04/18/17 1723  BP: (!) 159/89  (!) 155/97   Pulse: (!) 128  86   Resp:   13   Temp: (!) 103 F (39.4 C) (!) 103.1 F (39.5 C) (!) 102.5 F (39.2 C) (!) 102.3 F (39.1 C)  TempSrc: Oral Oral Oral Oral  SpO2: 99%  95%   Weight:      Height:        Constitutional: Patient with high-grade temperature, no nausea, no vomiting, no chest pain, no abdominal pain.  Reporting some shortness of breath and just generalized malaise.  Patient also reported intermittent episode of coughing spells (non-productive cough) Eyes: PERTLA, lids and conjunctivae normal, no icterus, no nystagmus. ENMT: Mucous membranes are moist. Posterior pharynx clear of any exudate or lesions. Normal dentition.  Neck: supple, no masses, no thyromegaly, no JVD appreciated Respiratory: Scattered rhonchi, no wheezing, fair air movement bilaterally; no crackles.  Patient with tachypnea but  no using accessory muscles.  Cardiovascular: Tachycardic, sinus rhythm by evaluation of telemetry; no murmurs, no gallops, no rubs.   Abdomen: Obese, no distension, no tenderness, no masses palpated. No hepatosplenomegaly. Bowel sounds normal.  Musculoskeletal: no clubbing / cyanosis. No joint deformity upper and lower extremities. Good ROM, no contractures. Normal muscle tone.  Skin: no rashes, lesions, ulcers. No induration Neurologic: CN 2-12 grossly intact. Sensation intact, DTR normal. Strength 5/5 in all 4 limbs.  Psychiatric: Normal judgment and insight. Alert and oriented x 3. Normal mood.    Labs on Admission: I have personally reviewed following labs and imaging studies  CBC: Recent Labs  Lab 04/18/17 0950  WBC 9.2  NEUTROABS 8.5*  HGB 10.9*  HCT 34.0*  MCV 84.2  PLT 875   Basic Metabolic Panel: Recent Labs  Lab 04/18/17 0950  NA 136  K 3.9  CL 101  CO2 25  GLUCOSE 104*  BUN 17  CREATININE 0.97  CALCIUM 8.9   Liver Function Tests: Recent Labs  Lab 04/18/17 0950  AST 21  ALT 17  ALKPHOS 76  BILITOT 0.7  PROT 8.2*  ALBUMIN 4.2   Urine analysis:    Component Value Date/Time   COLORURINE YELLOW 06/04/2009 1050   APPEARANCEUR CLEAR 06/04/2009 1050   LABSPEC 1.025 06/04/2009 1050   PHURINE 6.0 06/04/2009 1050   GLUCOSEU NEGATIVE 06/04/2009 1050   HGBUR MODERATE (A) 06/04/2009 1050   BILIRUBINUR NEGATIVE 06/04/2009 1050   KETONESUR NEGATIVE 06/04/2009 1050   PROTEINUR 30 (A) 06/04/2009 1050   UROBILINOGEN 1.0 06/04/2009 1050   NITRITE NEGATIVE 06/04/2009 1050   LEUKOCYTESUR NEGATIVE 06/04/2009 1050    Radiological Exams on Admission: Dg Chest 2 View  Result Date: 04/18/2017 CLINICAL DATA:  61 year old female with shortness breath, dry cough and body aches with fever for the past week. Hypertension, smoking and thyroid disease. Initial encounter. EXAM: CHEST  2 VIEW COMPARISON:  05/25/2016 chest x-ray. FINDINGS: Cardiomegaly. Pulmonary vascular  congestion. No segmental consolidation. No pneumothorax. Exam limited by habitus and technique however, no plain film evidence pulmonary malignancy. Calcified mildly tortuous aorta. Bilateral shoulder joint degenerative changes. Cannot exclude humeral head avascular necrosis. Mild degenerative changes thoracic spine without focal compression fracture. IMPRESSION: Cardiomegaly. Pulmonary vascular congestion. No segmental consolidation. Aortic Atherosclerosis (ICD10-I70.0). Calcified mildly tortuous aorta. Bilateral shoulder joint degenerative changes. Cannot exclude humeral head avascular necrosis. Electronically Signed   By: Genia Del M.D.   On: 04/18/2017 09:39   Ct Angio Chest Pe W And/or Wo Contrast  Result Date: 04/18/2017 CLINICAL DATA:  Shortness of breath. EXAM: CT ANGIOGRAPHY CHEST WITH CONTRAST TECHNIQUE: Multidetector CT imaging of the chest was performed using the standard protocol during bolus administration of intravenous contrast. Multiplanar CT image reconstructions and MIPs were obtained to evaluate the vascular anatomy. CONTRAST:  100 mL of Isovue 370 intravenously. COMPARISON:  None. FINDINGS: Cardiovascular: Satisfactory opacification of the pulmonary arteries to the segmental level. No evidence of pulmonary embolism. Mild cardiomegaly is noted. No pericardial effusion. Mediastinum/Nodes: No enlarged mediastinal, hilar, or axillary lymph nodes. Thyroid gland, trachea, and esophagus demonstrate no significant findings. Lungs/Pleura: No pneumothorax or pleural effusion is noted. Mild left upper lobe infiltrate or atelectasis is noted. Upper Abdomen: No acute abnormality. Musculoskeletal: No chest wall abnormality. No acute or significant osseous findings. Review of the MIP images confirms the above findings. IMPRESSION: No evidence of pulmonary embolus. Mild cardiomegaly. Mild left upper lobe infiltrate or atelectasis is noted. Electronically Signed   By: Marijo Conception, M.D.   On:  04/18/2017 12:02    EKG:  None  Assessment/Plan 1-influenza A/left upper lobe pneumonia -Patient with productive cough, shortness of breath, high-grade temperature, diffuse body aches and general malaise. -Influenza panel positive for influenza A and has CT of her chest demonstrating left upper lobe infiltrates. -Patient will be treated with Zithromax and Rocephin, and will also give Tamiflu -DuoNeb and oxygen supplementation as needed -Flutter valve, incentive spirometer, IV fluids and supportive care -Blood cultures, strep pneumo and Legionella antigen in urine, along with sputum culture has been ordered. -Will follow clinical response and pursued further workup as per pneumonia protocol.  2-essential hypertension -Continue amlodipine -Will also use as needed hydralazine.    3-major depressive disorder, recurrent, severe without psychotic features ([place]) -Patient chronically no using any antidepressant at this moment -Denies suicidal ideation or hallucination mood appears to be stable and appropriate for current situation -Will monitor.  4-hypothyroidism -TSH in the 22 range -Will continue Synthroid, dose adjusted to 75 mcg -Patient will require follow-up of her thyroid panel in 3-4 weeks.   5-obesity, Class II, BMI 35.44 -Low calorie diet, increase exercise and  weight loss has been discussed with patient. -Body mass index is 35.44 kg/m.  6-GERD -continue use of PPI  7-elevated d-dimer -With a CT angios that has demonstrated negative pulmonary embolism -Most likely associated with active infection.  8-prior history of illicit drugs use -Will check UDS -Avoid the use of beta-blockers until results are back. -Patient reported that she has been clean lately.  9-tobacco abuse -Cessation counseling has been provided -Will use nicotine patch.  Time: 65 minutes.   DVT prophylaxis: Heparin Code Status: Full code Family Communication: No family at bedside Disposition  Plan: Anticipate discharge back home once medically stable. Consults called: None Admission status: Length of stay more than 2 midnights, inpatient, telemetry bed.   Barton Dubois MD [facility] Pager [phone]  If 7PM-7AM, please contact night-coverage [email or web address] Password [email or web address]  04/18/2017, 7:04 PM

## 2017-04-18 NOTE — ED Notes (Addendum)
Call report to Corinne at [phone] at [phone]

## 2017-04-18 NOTE — ED Triage Notes (Signed)
Patient called EMS for SOB for 2 days with a productive cough. No obvious Resp distress. Patient was tachypnea and has wheezes. NS neb started by EMS. Patient has had a recent changes in thyroid medication and thinks the dose is too much. Also patient has been sleeping with her windows open for the last couple of weeks due to them painting her apartment.

## 2017-04-18 NOTE — ED Notes (Signed)
Pt needed help getting back in the bed. Pt was placed in the bed, brakes locked and call bell in reach.

## 2017-04-18 NOTE — ED Notes (Signed)
Placed Pt on [place] Catheter. Cleaned Pt prior to placement.

## 2017-04-18 NOTE — ED Notes (Signed)
Bed: TK16 Expected date:  Expected time:  Means of arrival:  Comments: EMS 61 yo SOB

## 2017-04-18 NOTE — ED Notes (Signed)
Report given to floor rn 

## 2017-04-19 ENCOUNTER — Inpatient Hospital Stay (HOSPITAL_COMMUNITY): Payer: 59

## 2017-04-19 DIAGNOSIS — R6520 Severe sepsis without septic shock: Secondary | ICD-10-CM

## 2017-04-19 DIAGNOSIS — E669 Obesity, unspecified: Secondary | ICD-10-CM

## 2017-04-19 DIAGNOSIS — J80 Acute respiratory distress syndrome: Secondary | ICD-10-CM

## 2017-04-19 DIAGNOSIS — R0603 Acute respiratory distress: Secondary | ICD-10-CM

## 2017-04-19 DIAGNOSIS — A419 Sepsis, unspecified organism: Secondary | ICD-10-CM

## 2017-04-19 DIAGNOSIS — J181 Lobar pneumonia, unspecified organism: Principal | ICD-10-CM

## 2017-04-19 DIAGNOSIS — J189 Pneumonia, unspecified organism: Secondary | ICD-10-CM

## 2017-04-19 DIAGNOSIS — J101 Influenza due to other identified influenza virus with other respiratory manifestations: Secondary | ICD-10-CM

## 2017-04-19 DIAGNOSIS — G9340 Encephalopathy, unspecified: Secondary | ICD-10-CM

## 2017-04-19 DIAGNOSIS — J1000 Influenza due to other identified influenza virus with unspecified type of pneumonia: Secondary | ICD-10-CM

## 2017-04-19 DIAGNOSIS — R0902 Hypoxemia: Secondary | ICD-10-CM

## 2017-04-19 LAB — URINALYSIS, ROUTINE W REFLEX MICROSCOPIC
BILIRUBIN URINE: NEGATIVE
Glucose, UA: NEGATIVE mg/dL
KETONES UR: NEGATIVE mg/dL
Leukocytes, UA: NEGATIVE
Nitrite: NEGATIVE
Specific Gravity, Urine: 1.028 (ref 1.005–1.030)
pH: 5 (ref 5.0–8.0)

## 2017-04-19 LAB — COMPREHENSIVE METABOLIC PANEL
ALK PHOS: 63 U/L (ref 38–126)
ALK PHOS: 89 U/L (ref 38–126)
ALT: 16 U/L (ref 14–54)
ALT: 46 U/L (ref 14–54)
ANION GAP: 8 (ref 5–15)
AST: 23 U/L (ref 15–41)
AST: 94 U/L — AB (ref 15–41)
Albumin: 3.6 g/dL (ref 3.5–5.0)
Albumin: 3.6 g/dL (ref 3.5–5.0)
Anion gap: 12 (ref 5–15)
BILIRUBIN TOTAL: 0.6 mg/dL (ref 0.3–1.2)
BUN: 14 mg/dL (ref 6–20)
BUN: 15 mg/dL (ref 6–20)
CALCIUM: 8.5 mg/dL — AB (ref 8.9–10.3)
CALCIUM: 8.3 mg/dL — AB (ref 8.9–10.3)
CHLORIDE: 99 mmol/L — AB (ref 101–111)
CO2: 25 mmol/L (ref 22–32)
CO2: 22 mmol/L (ref 22–32)
CREATININE: 0.91 mg/dL (ref 0.44–1.00)
CREATININE: 1.15 mg/dL — AB (ref 0.44–1.00)
Chloride: 104 mmol/L (ref 101–111)
GFR calc non Af Amer: 51 mL/min — ABNORMAL LOW (ref >60)
GFR, EST AFRICAN AMERICAN: 59 mL/min — AB (ref >60)
Glucose, Bld: 98 mg/dL (ref 65–99)
Glucose, Bld: 147 mg/dL — ABNORMAL HIGH (ref 65–99)
Potassium: 3.6 mmol/L (ref 3.5–5.1)
Potassium: 3.4 mmol/L — ABNORMAL LOW (ref 3.5–5.1)
SODIUM: 137 mmol/L (ref 135–145)
SODIUM: 133 mmol/L — AB (ref 135–145)
TOTAL PROTEIN: 7.1 g/dL (ref 6.5–8.1)
Total Bilirubin: 0.5 mg/dL (ref 0.3–1.2)
Total Protein: 7.8 g/dL (ref 6.5–8.1)

## 2017-04-19 LAB — CBC WITH DIFFERENTIAL/PLATELET
BASOS ABS: 0 10*3/uL (ref 0.0–0.1)
Basophils Relative: 0 %
EOS ABS: 0 10*3/uL (ref 0.0–0.7)
EOS PCT: 0 %
HCT: 33.1 % — ABNORMAL LOW (ref 36.0–46.0)
HEMOGLOBIN: 10.6 g/dL — AB (ref 12.0–15.0)
LYMPHS ABS: 0.4 10*3/uL — AB (ref 0.7–4.0)
LYMPHS PCT: 4 %
MCH: 27 pg (ref 26.0–34.0)
MCHC: 32 g/dL (ref 30.0–36.0)
MCV: 84.4 fL (ref 78.0–100.0)
Monocytes Absolute: 0.3 10*3/uL (ref 0.1–1.0)
Monocytes Relative: 3 %
NEUTROS PCT: 93 %
Neutro Abs: 9 10*3/uL — ABNORMAL HIGH (ref 1.7–7.7)
PLATELETS: 200 10*3/uL (ref 150–400)
RBC: 3.92 MIL/uL (ref 3.87–5.11)
RDW: 15.4 % (ref 11.5–15.5)
WBC: 9.7 10*3/uL (ref 4.0–10.5)

## 2017-04-19 LAB — BLOOD GAS, ARTERIAL
Acid-base deficit: 8.8 mmol/L — ABNORMAL HIGH (ref 0.0–2.0)
Acid-base deficit: 2.5 mmol/L — ABNORMAL HIGH (ref 0.0–2.0)
BICARBONATE: 19.9 mmol/L — AB (ref 20.0–28.0)
Bicarbonate: 23.9 mmol/L (ref 20.0–28.0)
DELIVERY SYSTEMS: POSITIVE
DRAWN BY: 308601
Delivery systems: POSITIVE
Drawn by: 308601
Expiratory PAP: 5
Expiratory PAP: 8
FIO2: 100
FIO2: 100
Inspiratory PAP: 15
Inspiratory PAP: 18
MODE: POSITIVE
Mode: POSITIVE
O2 Saturation: 84.6 %
O2 Saturation: 92.6 %
Patient temperature: 103
Patient temperature: 38.9
pCO2 arterial: 66.5 mmHg (ref 32.0–48.0)
pCO2 arterial: 56.5 mmHg — ABNORMAL HIGH (ref 32.0–48.0)
pH, Arterial: 7.123 — CL (ref 7.350–7.450)
pH, Arterial: 7.261 — ABNORMAL LOW (ref 7.350–7.450)
pO2, Arterial: 79.5 mmHg — ABNORMAL LOW (ref 83.0–108.0)
pO2, Arterial: 86.5 mmHg (ref 83.0–108.0)

## 2017-04-19 LAB — CBC
HEMATOCRIT: 30.6 % — AB (ref 36.0–46.0)
HEMOGLOBIN: 9.7 g/dL — AB (ref 12.0–15.0)
MCH: 26.5 pg (ref 26.0–34.0)
MCHC: 31.7 g/dL (ref 30.0–36.0)
MCV: 83.6 fL (ref 78.0–100.0)
Platelets: 166 10*3/uL (ref 150–400)
RBC: 3.66 MIL/uL — ABNORMAL LOW (ref 3.87–5.11)
RDW: 15.6 % — ABNORMAL HIGH (ref 11.5–15.5)
WBC: 6 10*3/uL (ref 4.0–10.5)

## 2017-04-19 LAB — LACTIC ACID, PLASMA: Lactic Acid, Venous: 1.8 mmol/L (ref 0.5–1.9)

## 2017-04-19 LAB — MRSA PCR SCREENING: MRSA BY PCR: NEGATIVE

## 2017-04-19 LAB — HIV ANTIBODY (ROUTINE TESTING W REFLEX): HIV Screen 4th Generation wRfx: NONREACTIVE

## 2017-04-19 MED ORDER — ACETAMINOPHEN 325 MG PO TABS
650.0000 mg | ORAL_TABLET | Freq: Four times a day (QID) | ORAL | Status: DC | PRN
  Filled 2017-04-19: qty 2

## 2017-04-19 MED ORDER — LORAZEPAM 2 MG/ML IJ SOLN
1.0000 mg | INTRAMUSCULAR | Status: DC | PRN
  Administered 2017-04-19: 1 mg via INTRAVENOUS
  Filled 2017-04-19: qty 1

## 2017-04-19 MED ORDER — GUAIFENESIN-DM 100-10 MG/5ML PO SYRP
5.0000 mL | ORAL_SOLUTION | ORAL | Status: DC | PRN
  Administered 2017-04-19: 5 mL via ORAL
  Filled 2017-04-19 (×2): qty 10

## 2017-04-19 MED ORDER — FENTANYL CITRATE (PF) 100 MCG/2ML IJ SOLN: 100.0000 ug | Freq: Once | INTRAMUSCULAR | Status: DC

## 2017-04-19 MED ORDER — SUCCINYLCHOLINE CHLORIDE 20 MG/ML IJ SOLN
100.0000 mg | Freq: Once | INTRAMUSCULAR | Status: AC
  Administered 2017-04-19: 100 mg via INTRAVENOUS
  Filled 2017-04-19: qty 5

## 2017-04-19 MED ORDER — FUROSEMIDE 10 MG/ML IJ SOLN
80.0000 mg | Freq: Once | INTRAMUSCULAR | Status: AC
  Administered 2017-04-19: 80 mg via INTRAVENOUS

## 2017-04-19 MED ORDER — LABETALOL HCL 5 MG/ML IV SOLN
10.0000 mg | INTRAVENOUS | Status: DC | PRN
  Filled 2017-04-19: qty 4

## 2017-04-19 MED ORDER — MOMETASONE FURO-FORMOTEROL FUM 100-5 MCG/ACT IN AERO
2.0000 | INHALATION_SPRAY | Freq: Two times a day (BID) | RESPIRATORY_TRACT | Status: DC
  Filled 2017-04-19: qty 8.8

## 2017-04-19 MED ORDER — ACETAMINOPHEN 650 MG RE SUPP
650.0000 mg | Freq: Four times a day (QID) | RECTAL | Status: DC | PRN
  Administered 2017-04-19: 650 mg via RECTAL

## 2017-04-19 MED ORDER — MIDAZOLAM HCL 2 MG/2ML IJ SOLN
2.0000 mg | INTRAMUSCULAR | Status: DC | PRN
  Filled 2017-04-19: qty 2

## 2017-04-19 MED ORDER — ETOMIDATE 2 MG/ML IV SOLN
30.0000 mg | Freq: Once | INTRAVENOUS | Status: AC
  Administered 2017-04-19: 30 mg via INTRAVENOUS
  Filled 2017-04-19: qty 20

## 2017-04-19 MED ORDER — ALBUTEROL SULFATE (2.5 MG/3ML) 0.083% IN NEBU
2.5000 mg | INHALATION_SOLUTION | RESPIRATORY_TRACT | Status: DC | PRN
  Administered 2017-04-19: 2.5 mg via RESPIRATORY_TRACT
  Filled 2017-04-19: qty 3

## 2017-04-19 MED ORDER — PROPOFOL 1000 MG/100ML IV EMUL
0.0000 ug/kg/min | INTRAVENOUS | Status: DC
  Administered 2017-04-20 (×2): 5 ug/kg/min via INTRAVENOUS
  Filled 2017-04-19: qty 100

## 2017-04-19 MED ORDER — LORAZEPAM 2 MG/ML IJ SOLN
INTRAMUSCULAR | Status: AC
  Filled 2017-04-19: qty 1

## 2017-04-19 MED ORDER — DEXTROSE 5 % IV SOLN
1.0000 g | Freq: Three times a day (TID) | INTRAVENOUS | Status: DC
  Administered 2017-04-19 – 2017-04-25 (×18): 1 g via INTRAVENOUS
  Filled 2017-04-19 (×19): qty 1

## 2017-04-19 MED ORDER — LORAZEPAM 2 MG/ML IJ SOLN
1.0000 mg | Freq: Once | INTRAMUSCULAR | Status: AC
  Administered 2017-04-19: 1 mg via INTRAVENOUS

## 2017-04-19 MED ORDER — SODIUM CHLORIDE 0.9 % IV SOLN
2000.0000 mg | INTRAVENOUS | Status: AC
  Administered 2017-04-19: 2000 mg via INTRAVENOUS
  Filled 2017-04-19: qty 2000

## 2017-04-19 MED ORDER — MIDAZOLAM HCL 2 MG/2ML IJ SOLN
2.0000 mg | INTRAMUSCULAR | Status: DC | PRN
  Administered 2017-04-20 (×2): 2 mg via INTRAVENOUS
  Filled 2017-04-19: qty 2

## 2017-04-19 MED ORDER — ACETAMINOPHEN 650 MG RE SUPP
RECTAL | Status: AC
  Filled 2017-04-19: qty 1

## 2017-04-19 MED ORDER — FENTANYL CITRATE (PF) 100 MCG/2ML IJ SOLN
100.0000 ug | INTRAMUSCULAR | Status: DC | PRN
  Administered 2017-04-20 – 2017-04-22 (×2): 100 ug via INTRAVENOUS
  Filled 2017-04-19 (×2): qty 2

## 2017-04-19 MED ORDER — FENTANYL CITRATE (PF) 100 MCG/2ML IJ SOLN
100.0000 ug | INTRAMUSCULAR | Status: DC | PRN
  Administered 2017-04-20 – 2017-04-22 (×8): 100 ug via INTRAVENOUS

## 2017-04-19 MED ORDER — TRAMADOL HCL 50 MG PO TABS
50.0000 mg | ORAL_TABLET | Freq: Three times a day (TID) | ORAL | Status: DC | PRN
  Administered 2017-04-19: 50 mg via ORAL

## 2017-04-19 MED ORDER — FENTANYL CITRATE (PF) 100 MCG/2ML IJ SOLN
100.0000 ug | Freq: Once | INTRAMUSCULAR | Status: AC
Start: 2017-04-19 — End: 2017-04-19
  Administered 2017-04-19: 100 ug via INTRAVENOUS
  Filled 2017-04-19: qty 2

## 2017-04-19 MED ORDER — MIDAZOLAM HCL 2 MG/2ML IJ SOLN
2.0000 mg | Freq: Once | INTRAMUSCULAR | Status: AC
  Administered 2017-04-19: 2 mg via INTRAVENOUS
  Filled 2017-04-19: qty 2

## 2017-04-19 MED ORDER — SODIUM CHLORIDE 0.9 % IV SOLN
1250.0000 mg | INTRAVENOUS | Status: DC
  Administered 2017-04-20 – 2017-04-21 (×2): 1250 mg via INTRAVENOUS
  Filled 2017-04-19 (×2): qty 1250

## 2017-04-19 MED ORDER — LORAZEPAM 2 MG/ML IJ SOLN: 1.0000 mg | INTRAMUSCULAR | Status: DC

## 2017-04-19 MED ORDER — METOPROLOL TARTRATE 5 MG/5ML IV SOLN
10.0000 mg | Freq: Once | INTRAVENOUS | Status: AC
  Administered 2017-04-19: 10 mg via INTRAVENOUS

## 2017-04-19 MED ORDER — BENZONATATE 100 MG PO CAPS
200.0000 mg | ORAL_CAPSULE | Freq: Three times a day (TID) | ORAL | Status: DC | PRN
  Administered 2017-04-19: 200 mg via ORAL
  Filled 2017-04-19: qty 2

## 2017-04-19 MED ORDER — BUDESONIDE 0.5 MG/2ML IN SUSP
0.5000 mg | Freq: Two times a day (BID) | RESPIRATORY_TRACT | Status: DC
  Administered 2017-04-20 (×2): 0.5 mg via RESPIRATORY_TRACT
  Filled 2017-04-19: qty 2

## 2017-04-19 MED ORDER — OSELTAMIVIR PHOSPHATE 75 MG PO CAPS
150.0000 mg | ORAL_CAPSULE | Freq: Two times a day (BID) | ORAL | Status: DC
  Administered 2017-04-20 – 2017-04-22 (×5): 150 mg via NASOGASTRIC
  Filled 2017-04-19 (×5): qty 2

## 2017-04-19 MED ORDER — ARFORMOTEROL TARTRATE 15 MCG/2ML IN NEBU
15.0000 ug | INHALATION_SOLUTION | Freq: Two times a day (BID) | RESPIRATORY_TRACT | Status: DC
  Administered 2017-04-20 (×2): 15 ug via RESPIRATORY_TRACT
  Filled 2017-04-19: qty 2

## 2017-04-19 NOTE — Progress Notes (Signed)
Arrived to pt room. Pt restless, confused, yelling out, dyspnea. Oxygen saturations 70% on 2 L/min. Resp 28. Called respiratory therapist. Placed pt on non rebreather. Called Rapid Response.

## 2017-04-19 NOTE — Progress Notes (Signed)
Patient instructed on Flutter. Demonstrated understanding

## 2017-04-19 NOTE — Progress Notes (Signed)
Jamie Hicks, son, arrived to the floor and was informed about patient's transfer to stepdown to room 1235.

## 2017-04-19 NOTE — Progress Notes (Signed)
Pharmacy Antibiotic Note  Jamie Hicks is a 61 y.o. female admitted on 04/18/2017 with pneumonia, initially started on ceftriaxone and azithromycin.  Pharmacy has been consulted for Vancomycin, Cefepime dosing as the patient continues to spike fevers, rapid response for respiratory distress, and transfer to ICU-SD.  Noted Influenza A positive - on day #2/5 of oseltamivir Noted Amoxicillin allergy (rash), but tolerating Ceftriaxone  Tm 104 WBC 6 SCr 0.91 > 1.15, Crcl ~ 69 ml/min  Plan:  Cefepime 1g IV q8h.  Vancomycin 2g IV x1 dose then 1250 mg IV q24h.  Measure Vanc trough at steady state.  Daily SCr  Follow up renal fxn, culture results, and clinical course.   Height: 5\' 9"  (175.3 cm) Weight: 244 lb 0.8 oz (110.7 kg) IBW/kg (Calculated) : 66.2  Temp (24hrs), Avg:101.4 F (38.6 C), Min:98.9 F (37.2 C), Max:104 F (40 C)  Recent Labs  Lab 04/18/17 0950 04/19/17 0451  WBC 9.2 6.0  CREATININE 0.97 0.91    Estimated Creatinine Clearance: 87.2 mL/min (by C-G formula based on SCr of 0.91 mg/dL).    Allergies  Allergen Reactions  . Amoxicillin     Has patient had a PCN reaction causing immediate rash, facial/tongue/throat swelling, SOB or lightheadedness with hypotension: yes Has patient had a PCN reaction causing severe rash involving mucus membranes or skin necrosis: no Has patient had a PCN reaction that required hospitalization: yes  Has patient had a PCN reaction occurring within the last 10 years: yes, 08/29/16 If all of the above answers are "NO", then may proceed with Cephalosporin use.   . Latex Rash    Antimicrobials this admission: 11/5 Ceftriaxone >> 11.6 11/5 Azithromycin >> 11.6 11/5 Oseltamivir >> 11/6 Cefepime >> 11/6 Vancomycin >>  Thank you for allowing pharmacy to be a part of this patient's care.  Gretta Arab PharmD, BCPS Pager [phone] 04/19/2017 8:44 PM

## 2017-04-19 NOTE — Procedures (Signed)
Endotracheal Intubation Procedure Note Indication for endotracheal intubation: respiratory failure Sedation: etomidate and midazolam Paralytic: succinylcholine Equipment: Macintosh 3 laryngoscope blade and 7.65mm cuffed endotracheal tube Cricoid Pressure: yes Number of attempts: 1 ETT location confirmed by by auscultation, by CXR and ETCO2 monitor.

## 2017-04-19 NOTE — Progress Notes (Signed)
eLink Physician-Brief Progress Note Patient Name: Jamie Hicks DOB: December 30, 1955 MRN: [id number]   Date of Service  04/19/2017  HPI/Events of Note  Admitted 11/05 with influenza A, and left lower lobe pneumonia.  The patient was initially on 2 L nasal cannula, earlier this evening saturations dropped and she was started on 100% nonrebreather.  Rapid response was called and the patient was transferred to stepdown unit.  Review of CT chest 04/18/17 shows pulmonary edema, cardiomegaly, pulmonary hypertension. Currently the patient is on BiPAP setting 18/8 FiO2 equals 100% respiratory rate is 22.  eICU Interventions  Patient is already receiving antibiotics and Lasix, will continue to monitor on BiPAP.  May require intubation.        Laverle Hobby 04/19/2017, 8:43 PM

## 2017-04-19 NOTE — Progress Notes (Signed)
[facility] PROGRESS NOTE  Nashalie Sallis Barthold QVZ:[id number] DOB: Dec 09, 1955 DOA: 04/18/2017 PCP: Patient, No Pcp Per  Interim summary and HPI 61 year old female with a past medical history significant for hypertension, tobacco abuse, morbid obesity, hypothyroidism, depression and history of medication noncompliance; who presented to the hospital with about 3 days of general malaise, body aches, fever/chills, nonproductive cough and shortness of breath.  Patient denies any sick contacts, reported no flu shots this year.  She denies chest pain, hemoptysis, nausea/vomiting, hematemesis, dysuria, hematochezia or melena.  She presented to the ED for further evaluation and treatment.  Found to have influenza A and Left upper lobe PNA.  Assessment/Plan: 1-influenza A/left upper lobe pneumonia -Patient with productive cough, shortness of breath, high-grade temperature, diffuse body aches and general malaise. -Influenza panel positive for influenza A and has CT of her chest demonstrating left upper lobe infiltrates. -continue IV zithromax and rocephin and complete a total of 5 days of Tamiflu -continue DuoNeb, dulera and oxygen supplementation as needed -continue Flutter valve, incentive spirometer, IV fluids and supportive care -follow Blood cultures, strep pneumo and Legionella antigen in urine, along with sputum culture. -Will follow clinical response and pursued further workup as per pneumonia protocol.  2-essential hypertension -Continue amlodipine -Will also use as needed hydralazine.    3-major depressive disorder, recurrent, severe without psychotic features ([place]) -Patient chronically no using any antidepressant at this moment -Denies suicidal ideation or hallucination; mood appears to be stable and appropriate for current situation -Will monitor and recommend outpatient follow up with psychiatry .  4-hypothyroidism -TSH in the 22 range -Will continue Synthroid, dose adjusted  to 75 mcg -Patient will require follow-up of her thyroid panel in 3-4 weeks.   5-obesity, Class II, BMI 35.44 -Low calorie diet, increase exercise and weight loss has been discussed with patient. -Body mass index is 35.44 kg/m.  6-GERD -continue use of PPI -no prior hx of ulcers  7-elevated d-dimer -With a CT angio that has demonstrated negative pulmonary embolism -Most likely associated with active infection.  8-prior history of illicit drugs use -Will follow ordered UDS -Avoid the use of beta-blockers until results are back. -Patient reported that she has been clean lately.  9-tobacco abuse -Cessation counseling has been provided -Will continue use of nicotine patch.   Code Status: Full code Family Communication: No family at bedside Disposition Plan: Remains inpatient, patient still with ongoing difficulty breathing, spiking fever and not feeling good.  Anticipate discharge back home once medically stable.   Consultants:  None  Procedures:  See below for x-ray reports  Antibiotics:  Rocephin and Zithromax  HPI/Subjective: Spiking fevers, no chest pain, no abdominal pain, no nausea, no vomiting.  Still with general malaise and diffuse aches and feeling short of breath/tachypneic with minimal exertion.  Objective: Vitals:   04/19/17 1011 04/19/17 1650  BP:  (!) 172/103  Pulse: 85 96  Resp: (!) 22 20  Temp:  (!) 102.6 F (39.2 C)  SpO2: 96% 91%    Intake/Output Summary (Last 24 hours) at 04/19/2017 1853 Last data filed at 04/19/2017 1800 Gross per 24 hour  Intake 835 ml  Output 900 ml  Net -65 ml   Filed Weights   04/18/17 0900 04/18/17 2000  Weight: 108.9 kg (240 lb) 108.8 kg (239 lb 13.8 oz)    Exam:   General: Patient continued to spike fever and is complaining of general malaise and diffuse aches.  Some nonproductive cough and tachypnea/shortness of breath with minimal exertion.  Using 2  L nasal cannula oxygen  supplementation.  Cardiovascular: S1 and S2, no rubs, no gallops  Respiratory: Positive tachypnea, positive expiratory wheezing and scattered rhonchi, no crackles.  Patient is no using accessory muscles.  Abdomen: Soft, nontender, nondistended, positive bowel sounds  Musculoskeletal: No edema, no cyanosis, no clubbing.  Data Reviewed: Basic Metabolic Panel: Recent Labs  Lab 04/18/17 0950 04/19/17 0451  NA 136 137  K 3.9 3.6  CL 101 104  CO2 25 25  GLUCOSE 104* 98  BUN 17 14  CREATININE 0.97 0.91  CALCIUM 8.9 8.5*   Liver Function Tests: Recent Labs  Lab 04/18/17 0950 04/19/17 0451  AST 21 23  ALT 17 16  ALKPHOS 76 63  BILITOT 0.7 0.6  PROT 8.2* 7.1  ALBUMIN 4.2 3.6   CBC: Recent Labs  Lab 04/18/17 0950 04/19/17 0451  WBC 9.2 6.0  NEUTROABS 8.5*  --   HGB 10.9* 9.7*  HCT 34.0* 30.6*  MCV 84.2 83.6  PLT 178 166    Recent Results (from the past 240 hour(s))  Blood culture (routine x 2)     Status: None (Preliminary result)   Collection Time: 04/18/17 10:14 AM  Result Value Ref Range Status   Specimen Description BLOOD BLOOD LEFT FOREARM  Final   Special Requests   Final    BOTTLES DRAWN AEROBIC AND ANAEROBIC Blood Culture adequate volume   Culture   Final    NO GROWTH 1 DAY Performed at [facility], [place] [address], [place], [place] [address]    Report Status PENDING  Incomplete  Blood culture (routine x 2)     Status: None (Preliminary result)   Collection Time: 04/18/17 10:14 AM  Result Value Ref Range Status   Specimen Description BLOOD RIGHT HAND  Final   Special Requests   Final    BOTTLES DRAWN AEROBIC AND ANAEROBIC Blood Culture adequate volume   Culture   Final    NO GROWTH 1 DAY Performed at [facility], [place] [address] [place], [place] [address]    Report Status PENDING  Incomplete     Studies: Dg Chest 2 View  Result Date: 04/18/2017 CLINICAL DATA:  61 year old female with shortness breath, dry cough and body aches  with fever for the past week. Hypertension, smoking and thyroid disease. Initial encounter. EXAM: CHEST  2 VIEW COMPARISON:  05/25/2016 chest x-ray. FINDINGS: Cardiomegaly. Pulmonary vascular congestion. No segmental consolidation. No pneumothorax. Exam limited by habitus and technique however, no plain film evidence pulmonary malignancy. Calcified mildly tortuous aorta. Bilateral shoulder joint degenerative changes. Cannot exclude humeral head avascular necrosis. Mild degenerative changes thoracic spine without focal compression fracture. IMPRESSION: Cardiomegaly. Pulmonary vascular congestion. No segmental consolidation. Aortic Atherosclerosis (ICD10-I70.0). Calcified mildly tortuous aorta. Bilateral shoulder joint degenerative changes. Cannot exclude humeral head avascular necrosis. Electronically Signed   By: Genia Del M.D.   On: 04/18/2017 09:39   Ct Angio Chest Pe W And/or Wo Contrast  Result Date: 04/18/2017 CLINICAL DATA:  Shortness of breath. EXAM: CT ANGIOGRAPHY CHEST WITH CONTRAST TECHNIQUE: Multidetector CT imaging of the chest was performed using the standard protocol during bolus administration of intravenous contrast. Multiplanar CT image reconstructions and MIPs were obtained to evaluate the vascular anatomy. CONTRAST:  100 mL of Isovue 370 intravenously. COMPARISON:  None. FINDINGS: Cardiovascular: Satisfactory opacification of the pulmonary arteries to the segmental level. No evidence of pulmonary embolism. Mild cardiomegaly is noted. No pericardial effusion. Mediastinum/Nodes: No enlarged mediastinal, hilar, or axillary lymph nodes. Thyroid gland,  trachea, and esophagus demonstrate no significant findings. Lungs/Pleura: No pneumothorax or pleural effusion is noted. Mild left upper lobe infiltrate or atelectasis is noted. Upper Abdomen: No acute abnormality. Musculoskeletal: No chest wall abnormality. No acute or significant osseous findings. Review of the MIP images confirms the above  findings. IMPRESSION: No evidence of pulmonary embolus. Mild cardiomegaly. Mild left upper lobe infiltrate or atelectasis is noted. Electronically Signed   By: Marijo Conception, M.D.   On: 04/18/2017 12:02    Scheduled Meds: . amLODipine  5 mg Oral Daily  . dextromethorphan-guaiFENesin  1 tablet Oral BID  . heparin  5,000 Units Subcutaneous Q8H  . ipratropium-albuterol  3 mL Nebulization Q6H  . levothyroxine  75 mcg Oral QAC breakfast  . loratadine  10 mg Oral Daily  . mometasone-formoterol  2 puff Inhalation BID  . nicotine  14 mg Transdermal Daily  . oseltamivir  75 mg Oral BID  . pantoprazole  40 mg Oral Q1200  . sodium chloride flush  3 mL Intravenous Q12H   Continuous Infusions: . sodium chloride 1,000 mL (04/19/17 1046)  . sodium chloride    . azithromycin Stopped (04/19/17 1230)  . cefTRIAXone (ROCEPHIN)  IV Stopped (04/19/17 1050)    Principal Problem:   Influenza A Active Problems:   Essential hypertension   Major depressive disorder, recurrent, severe without psychotic features ([place])   Thyroid activity decreased   Left upper lobe pneumonia (H[facility])   Obesity, Class III, BMI 40-49.9 (morbid obesity) ([place])    Time spent: 35 minutes    Barton Dubois  [facility] Pager [phone]. If 7PM-7AM, please contact night-coverage at [email or web address], password [facility] 04/19/2017, 6:53 PM  LOS: 1 day

## 2017-04-19 NOTE — Progress Notes (Signed)
Nutrition Brief Note  Patient identified on the Malnutrition Screening Tool (MST) Report  Wt Readings from Last 15 Encounters:  04/18/17 239 lb 13.8 oz (108.8 kg)  08/29/16 249 lb (112.9 kg)    Body mass index is 36.47 kg/m. Patient meets criteria for obese based on current BMI. Pt unaware of any unintentional weight loss. Pt reports a UBW of 240 lb. Records limited in weight readings. Pt shows an insignificant amount of weight loss in records. Do not suspect any recent weight loss.   Current diet order is heart healthy. Pt reports having loss in appetite for the last two days related to breathing difficulties. States appetite is ncreasing this admission and requests peanut butter crackers.   Labs and medications reviewed.   No nutrition interventions warranted at this time. If nutrition issues arise, please consult RD.   Mariana Single RD, LDN Clinical Nutrition Pager # [phone]

## 2017-04-19 NOTE — Progress Notes (Signed)
Placed PT on 100% non rebreather at 15 lpm - Sp02 approximately 3-4 minutes later 88%. PT breath sounds are expiratory wheeze, RR 25, HR 117. PT appears to be extremely anxious. Encouraged RN to consider Rapid Respond.

## 2017-04-19 NOTE — Progress Notes (Signed)
Rapid Response Event Note  Overview: called to pt room for confusion and SOB.       Initial Focused Assessment: Lungs course upper lobes, diminished bases.    Interventions:  1906- 232/118, RR 35, HR 122, 15 L NRB  Plan of Care (if not transferred): paged [place] NP, arrived at bedside- will transfer to SDU  Event Summary:   at      at          [place], [place]

## 2017-04-19 NOTE — Consult Note (Signed)
PULMONARY / CRITICAL CARE MEDICINE   Name: Jamie Hicks MRN: [id number] DOB: [birth year]    ADMISSION DATE:  04/18/2017 CONSULTATION DATE:  11/6  REFERRING MD:  Dr. Dyann Kief  CHIEF COMPLAINT:  Resp distress  HISTORY OF PRESENT ILLNESS:   61 year old female with PMH as below, which is significant for HTN, morbid obesity, and hypothyroidism. She presented to [facility] 11/5 with complaints of SOB, nonproductive cough, malaise, and muscle aches x 3 days. Upon arrival to the ED she was febrile. She underwent CT angio given concerns for PE. This was negative for PE, however, demonstrated LUL opacification thought to represent pneumonia. Rapid flu PCR positive for Flu A. She was admitted to SDU and the hospitalist team and was treated with IV antibiotics and tamiflu. Since that time she has become hypoxemic and dyspnea has continued to worsen. She was placed on BiPAP but remained hypoxemic. PCCM consulted.   At time of my exam patient is very somnolent with no response to sternal rub. RN reports patient had required a total of 2mg  of Ativan IV over the course of the evening as she had been agitated and not cooperating with the BIPAP but was hypoxic off the BIPAP. She is not currently protecting her airway well. She also remains hypercapneic even with the BIPAP in place. Therefore decision made to intubate. Patient's son and daughter at at bedside and consent to intubation.   PAST MEDICAL HISTORY :  She  has a past medical history of Back pain, Hypertension, and Thyroid disease.  PAST SURGICAL HISTORY: She  has a past surgical history that includes Hernia repair and knee arthorscopy.  Allergies  Allergen Reactions  . Amoxicillin     Has patient had a PCN reaction causing immediate rash, facial/tongue/throat swelling, SOB or lightheadedness with hypotension: yes Has patient had a PCN reaction causing severe rash involving mucus membranes or skin necrosis: no Has patient had a PCN  reaction that required hospitalization: yes  Has patient had a PCN reaction occurring within the last 10 years: yes, 08/29/16 If all of the above answers are "NO", then may proceed with Cephalosporin use.   . Latex Rash    No current facility-administered medications on file prior to encounter.    Current Outpatient Medications on File Prior to Encounter  Medication Sig  . levothyroxine (SYNTHROID, LEVOTHROID) 75 MCG tablet Take 75 mcg by mouth every morning.  Marland Kitchen amLODipine (NORVASC) 5 MG tablet Take 1 tablet (5 mg total) by mouth daily. For high blood pressure (Patient not taking: Reported on 12/24/2014)  . cyclobenzaprine (FLEXERIL) 10 MG tablet Take 1 tablet (10 mg total) by mouth at bedtime. (Patient not taking: Reported on 08/29/2016)  . diphenhydrAMINE (BENADRYL) 25 MG tablet Take 1 tablet (25 mg total) by mouth every 6 (six) hours as needed.  . hydrOXYzine (ATARAX/VISTARIL) 50 MG tablet Take 1 tablet (50 mg total) by mouth 3 (three) times daily as needed (anxiety). (Patient not taking: Reported on 12/24/2014)  . ibuprofen (ADVIL,MOTRIN) 800 MG tablet Take 1 tablet (800 mg total) by mouth every 8 (eight) hours as needed. (Patient not taking: Reported on 08/29/2016)  . ipratropium (ATROVENT) 0.06 % nasal spray Place 2 sprays into both nostrils 4 (four) times daily. For allergies (Patient not taking: Reported on 12/24/2014)  . levothyroxine (SYNTHROID, LEVOTHROID) 50 MCG tablet Take 1 tablet (50 mcg total) by mouth daily before breakfast. For low thyroid function (Patient not taking: Reported on 04/18/2017)  . loratadine (CLARITIN) 10 MG tablet  Take 1 tablet (10 mg total) by mouth daily. (This medicine may be purchased from over there counter at yr local pharmacy): For allergies (Patient not taking: Reported on 08/29/2016)  . nicotine (NICODERM CQ - DOSED IN MG/24 HOURS) 14 mg/24hr patch Place 1 patch (14 mg total) onto the skin daily. For nicotine addiction (Patient not taking: Reported on 12/24/2014)   . ranitidine (ZANTAC) 150 MG tablet Take 1 tablet (150 mg total) by mouth 2 (two) times daily. (Patient not taking: Reported on 04/18/2017)  . traMADol (ULTRAM) 50 MG tablet Take 1 tablet (50 mg total) by mouth every 6 (six) hours as needed for severe pain. (Patient not taking: Reported on 04/18/2017)  . zolpidem (AMBIEN) 5 MG tablet Take 1 tablet (5 mg total) by mouth at bedtime. For sleep (Patient not taking: Reported on 12/24/2014)   FAMILY HISTORY:  Her has no family status information on file.   SOCIAL HISTORY: She  reports that she has been smoking cigarettes.  she has never used smokeless tobacco. She reports that she drinks alcohol. She reports that she does not use drugs.  REVIEW OF SYSTEMS:   Review of Systems  Unable to perform ROS: Critical illness   SUBJECTIVE:  Unresponsive on BIPAP  VITAL SIGNS: BP (!) 212/97   Pulse 76   Temp (!) 104 F (40 C) (Core (Comment))   Resp (!) 34   Ht 5\' 9"  (1.753 m)   Wt 110.7 kg (244 lb 0.8 oz)   LMP 07/21/2014 Comment: current  SpO2 91%   BMI 36.04 kg/m   VENTILATOR SETTINGS: Vent Mode: BIPAP FiO2 (%):  [80 %-100 %] 80 % Set Rate:  [8 bmp] 8 bmp PEEP:  [8 cmH20] 8 cmH20  INTAKE / OUTPUT: I/O last 3 completed shifts: In: 2435 [P.O.:600; I.V.:235; IV Piggyback:1600] Out: 900 [Urine:900]  PHYSICAL EXAMINATION: General: Obese female, unresponsive on BIPAP, Critically ill Neuro: no response to sternal rub, pupils pinpoint and equal b/l, not moving extremities HEENT: dry MM, poor dentition; BIPAP mask in place Cardiovascular: RRR  No m/r/g Lungs: CTA b/l, RR 22 Abdomen: Soft NTND Musculoskeletal: no LE edema Skin: no rashes   LABS:  BMET Recent Labs  Lab 04/18/17 0950 04/19/17 0451 04/19/17 2033  NA 136 137 133*  K 3.9 3.6 3.4*  CL 101 104 99*  CO2 25 25 22   BUN 17 14 15   CREATININE 0.97 0.91 1.15*  GLUCOSE 104* 98 147*   Electrolytes Recent Labs  Lab 04/18/17 0950 04/19/17 0451 04/19/17 2033  CALCIUM 8.9  8.5* 8.3*   CBC Recent Labs  Lab 04/18/17 0950 04/19/17 0451 04/19/17 2033  WBC 9.2 6.0 9.7  HGB 10.9* 9.7* 10.6*  HCT 34.0* 30.6* 33.1*  PLT 178 166 200   Coag's No results for input(s): APTT, INR in the last 168 hours.  Sepsis Markers Recent Labs  Lab 04/19/17 2033  LATICACIDVEN 1.8   ABG Recent Labs  Lab 04/19/17 1920 04/19/17 2100  PHART 7.123* 7.261*  PCO2ART 66.5* 56.5*  PO2ART 79.5* 86.5   Liver Enzymes Recent Labs  Lab 04/18/17 0950 04/19/17 0451 04/19/17 2033  AST 21 23 94*  ALT 17 16 46  ALKPHOS 76 63 89  BILITOT 0.7 0.6 0.5  ALBUMIN 4.2 3.6 3.6   Cardiac Enzymes No results for input(s): TROPONINI, PROBNP in the last 168 hours.  Glucose No results for input(s): GLUCAP in the last 168 hours.  Imaging Dg Chest Port 1 View  Result Date: 04/19/2017 CLINICAL DATA:  Altered  mental status an high fever. EXAM: PORTABLE CHEST 1 VIEW COMPARISON:  CT of the chest 04/18/2017 FINDINGS: The cardiac silhouette is enlarged. There is no evidence of pleural effusion or pneumothorax. Bilateral widespread interstitial and alveolar airspace opacities with central predominance. Osseous structures are without acute abnormality. Soft tissues are grossly normal. IMPRESSION: Interval development of bilateral widespread interstitial and alveolar opacities with central predominance. This may represent a mixed pattern rapidly evolving pulmonary edema or multifocal airspace consolidation. Electronically Signed   By: Fidela Salisbury M.D.   On: 04/19/2017 20:18   STUDIES:  CTA chest 11/5 > No evidence of pulmonary embolus. Mild cardiomegaly. Mild left upper lobe infiltrate or atelectasis is noted. CXR 11/6 > progression of infiltrates to diffuse bilateral   CULTURES: Blood 11/5 >  Urine 11/6 > Sputum 11/6 > Influenza PCR 11/5 > Flu B neg, Flu A POS  ANTIBIOTICS: Cefepime 11/5 > Vancomycin 11/5 > Tamiflu 11/5 > Azithromycin 11/5-11/6 Ceftriaxone  11/5-11/6  SIGNIFICANT EVENTS: 11/5 admit for flu pneumonia 11/6 progression to ARDS  LINES/TUBES: ETT 11/6 > Foley catheter 11/6>> PIV's 11/5>>   ASSESSMENT / PLAN: 61 year old female with hx HTN, morbid obesity, and hypothyroidism, presented 11/5 with SOB, nonproductive cough, malaise, fever, and muscle aches x 3 days. She was diagnosed with Influenza A and went on to develop flu-related pneumonia with acute hypoxic respiratory failure that has progressed to ARDS. She now has acute hypoxic and hypercapneic respiratory failure and is failing BIPAP.   PULMONARY 1. Acute hypoxemic/hypercarbic respiratory failure secondary to influenza A pneumonia; Possible secondary bacterial pneumonia; ARDS; Possibly component pulmonary edema.  - failed BIPAP due to hypoxia, hypercapnea, and agitation; required intubation (see separate procedure note) - CXR daily - ABG and wean vent as tolerated - Lung protective ventilation 6 cc/kg per ARDS protocol - VAP bundle - TTE as part of ARDS workup - antibiotics and antivirals discussed below.   CARDIOVASCULAR 1. Hypertensive urgency with history of hypertension - had been hypertensive prior to intubation but now is normotensive - stop scheduled norvasc.  - continue hydralazine and labetalol PRN for goal SBP <170 - Telemetry monitoring  RENAL 1. Mild AKI; Hypokalemia; Hyponatremia: - avoid nephrotoxic agents - foley and monitor strict I/O's - due to concern for ARDS will keep IV at [facility] for now.   GASTROINTESTINAL No acute issues GI prophylaxis; NPO  HEMATOLOGIC 1. Anemia: - Hgb 10.6 down from baseline of 13; no clinically obvious blood loss; continue to monitor - Follow CBC - Transfuse per ICU guidelines - Heparin SQ for VTE ppx  INFECTIOUS 1. Severe sepsis: from Flu A pneumonia, with possible secondary bacterial pneumonia  - increase tamiflu to 150mg  PO BID given that patient is critically ill - continue Vanc and Cefepime (started  11/5) - blood cultures (11/6) pending; UA negative for infection - obtain sputum culture - lactate only 1.8 will trend; check procalcitonin.  - continue droplet precautions  ENDOCRINE 1. Hx Hypothyroidism: - continue levothyroxine    NEUROLOGIC 1. Acute Encephalopathy: - due to combination of the hypercapnea as well as the benzos she was given as well as the acute illness - continue to monitor   FAMILY  - Updated 2 (a son and daughter) of patient's 4 adult children at bedside. They report patient has no POA and although is legally married has not been in touch with her husband for several years to decades. They do not know where he lives or how to contact him.  - Inter-disciplinary family meet  or Palliative Care meeting due by: 04/26/17   60 minutes nonprocedural critical care time  Vernie Murders, MD  Pulmonary and [place] Pager: [phone]  04/19/2017, 9:31 PM

## 2017-04-19 NOTE — Progress Notes (Addendum)
RN paged NP for acute respiratory distress. 61 year old female who was admitted on 04/18/2017 diagnosed with influenza A and left upper lobe PNA. According the bedside RN the patient began yelling I can't breathe and her O2sat was in the 70's on 2L via [place] at that time. RR was then paged and the patient was placed on a NRB.  Upon entering the room RR RN at bedside. The pt was on a NRB and in noticeable distress. She was tachypneic with grunting, accessory muscle use, and she had crackles throughout. She was hypertensive into the 200's/100/s and tachycardic into the 110's-120's. Alert but anxious and unable to form full sentences due to her SOB. She was then transferred to stepdown by RR RN, bedside RN, and NP. Once pt transferred to stepdown she was placed on the monitor and the patient was placed on Bipap by RRT. A stat ABG was also obtained. BP was 212/97 and she was given 10mg  of metoprolol IV. Pt became very anxious and agitated attempting to remove the bipap and other medical equipment. An attempt was made to explain to the pt that the bipap was in place to help with her respiratory effort. She continued to have some anxiety and restlessness so she was given 2mg  of morphine for her anxiety and SOB. She was also placed in soft bilateral wrist restraints.  She was given 80mg  of lasix IV, foley catheter ordered and a stat CXR was obtained revealing rapidly evolving pulmonary edema. Pts temp was 104F orally and she was given 650mg  rectally and blood cultures were ordered as well. ABG results were reviewed as well as the most recent labwork. Son updated and present Assessment/Plan 1. Acute Respiratory distress secondary to pneumonia and Influenza A and flash pulmonary edema- Continue Bipap and obtain a repeat ABG. Obtain a CBC, CMP, Lactic Acid. PCCM made aware of situation and possible need of intubation should bipap not be successful  2. Influenza A/ left upper lobe pneumonia- Due to oral temp of 104 and resp  distress her antibiotics were switched to cefepime and vancomycin. She was also given 650mg  Tyleonol rectally. Lactate, CBC with diff, blood cultures, UA and cuture sent. Cooling blanket.   Arby Barrette NP-C [facility]  CRITICAL CARE Performed by: Vertis Kelch Total critical care time:120 minutes Critical care time was exclusive of separately billable procedures and treating other patients. Critical care was necessary to treat or prevent imminent or life-threatening deterioration. Critical care was time spent personally by me on the following activities: development of treatment plan with patient and/or surrogate as well as nursing, discussions with consultants, evaluation of patient's response to treatment, examination of patient, obtaining history from patient or surrogate, ordering and performing treatments and interventions, ordering and review of laboratory studies, ordering and review of radiographic studies, pulse oximetry and re-evaluation of patient's condition.  Addendum: Reassessed pt at bedside. VSS but requiring Ativan to tolerate Bipap due to some intermittent agitation. Most recent ABG reviewed and improving. Labs were also reviewed. Son at bedside and he was updated on the pts condition.   Arby Barrette NP-C [facility]  Addendum: Pt continued to not tolerate Bipap despite the use of sedation. She continued to be hypoxic, hypercapneic, and agitated. PCCM assessed the pt and determined she needed to be intubatd. Care transferred to PCCM at this time.   Arby Barrette NP-C [facility]

## 2017-04-20 ENCOUNTER — Other Ambulatory Visit (HOSPITAL_COMMUNITY): Payer: Self-pay | Admitting: *Deleted

## 2017-04-20 ENCOUNTER — Inpatient Hospital Stay (HOSPITAL_COMMUNITY): Payer: 59

## 2017-04-20 DIAGNOSIS — J9601 Acute respiratory failure with hypoxia: Secondary | ICD-10-CM

## 2017-04-20 DIAGNOSIS — J9602 Acute respiratory failure with hypercapnia: Secondary | ICD-10-CM

## 2017-04-20 DIAGNOSIS — I340 Nonrheumatic mitral (valve) insufficiency: Secondary | ICD-10-CM

## 2017-04-20 LAB — GLUCOSE, CAPILLARY
GLUCOSE-CAPILLARY: 117 mg/dL — AB (ref 65–99)
GLUCOSE-CAPILLARY: 105 mg/dL — AB (ref 65–99)
GLUCOSE-CAPILLARY: 85 mg/dL (ref 65–99)
Glucose-Capillary: 72 mg/dL (ref 65–99)
Glucose-Capillary: 75 mg/dL (ref 65–99)
Glucose-Capillary: 80 mg/dL (ref 65–99)
Glucose-Capillary: 89 mg/dL (ref 65–99)

## 2017-04-20 LAB — LACTIC ACID, PLASMA: Lactic Acid, Venous: 1.8 mmol/L (ref 0.5–1.9)

## 2017-04-20 LAB — CBC
HCT: 31.8 % — ABNORMAL LOW (ref 36.0–46.0)
HEMOGLOBIN: 10.1 g/dL — AB (ref 12.0–15.0)
MCH: 26.6 pg (ref 26.0–34.0)
MCHC: 31.8 g/dL (ref 30.0–36.0)
MCV: 83.9 fL (ref 78.0–100.0)
PLATELETS: 154 10*3/uL (ref 150–400)
RBC: 3.79 MIL/uL — AB (ref 3.87–5.11)
RDW: 15.7 % — ABNORMAL HIGH (ref 11.5–15.5)
WBC: 6.1 10*3/uL (ref 4.0–10.5)

## 2017-04-20 LAB — BLOOD GAS, ARTERIAL
Acid-base deficit: 0.8 mmol/L (ref 0.0–2.0)
Acid-base deficit: 0.9 mmol/L (ref 0.0–2.0)
BICARBONATE: 23.4 mmol/L (ref 20.0–28.0)
Bicarbonate: 25.7 mmol/L (ref 20.0–28.0)
Drawn by: 232811
Drawn by: 308601
FIO2: 100
FIO2: 70
LHR: 30 {breaths}/min
MECHVT: 400 mL
MECHVT: 400 mL
O2 SAT: 99 %
O2 SAT: 99.7 %
PATIENT TEMPERATURE: 38.1
PCO2 ART: 41.2 mmHg (ref 32.0–48.0)
PCO2 ART: 60 mmHg — AB (ref 32.0–48.0)
PEEP/CPAP: 10 cmH2O
PEEP: 10 cmH2O
PH ART: 7.265 — AB (ref 7.350–7.450)
PO2 ART: 184 mmHg — AB (ref 83.0–108.0)
PO2 ART: 235 mmHg — AB (ref 83.0–108.0)
Patient temperature: 38.5
RATE: 22 resp/min
pH, Arterial: 7.379 (ref 7.350–7.450)

## 2017-04-20 LAB — BASIC METABOLIC PANEL
ANION GAP: 11 (ref 5–15)
BUN: 18 mg/dL (ref 6–20)
CHLORIDE: 104 mmol/L (ref 101–111)
CO2: 22 mmol/L (ref 22–32)
Calcium: 8.3 mg/dL — ABNORMAL LOW (ref 8.9–10.3)
Creatinine, Ser: 1.17 mg/dL — ABNORMAL HIGH (ref 0.44–1.00)
GFR, EST AFRICAN AMERICAN: 57 mL/min — AB (ref >60)
GFR, EST NON AFRICAN AMERICAN: 50 mL/min — AB (ref >60)
Glucose, Bld: 108 mg/dL — ABNORMAL HIGH (ref 65–99)
POTASSIUM: 3.3 mmol/L — AB (ref 3.5–5.1)
SODIUM: 137 mmol/L (ref 135–145)

## 2017-04-20 LAB — TRIGLYCERIDES: TRIGLYCERIDES: 74 mg/dL (ref <150)

## 2017-04-20 LAB — ECHOCARDIOGRAM COMPLETE
HEIGHTINCHES: 69 in
WEIGHTICAEL: 3904.79 [oz_av]

## 2017-04-20 LAB — PROCALCITONIN
Procalcitonin: 10.7 ng/mL
Procalcitonin: 17.71 ng/mL

## 2017-04-20 LAB — BRAIN NATRIURETIC PEPTIDE: B NATRIURETIC PEPTIDE 5: 379.4 pg/mL — AB (ref 0.0–100.0)

## 2017-04-20 MED ORDER — MIDAZOLAM HCL 2 MG/2ML IJ SOLN
2.0000 mg | INTRAMUSCULAR | Status: DC | PRN
  Administered 2017-04-20 – 2017-04-22 (×5): 2 mg via INTRAVENOUS
  Filled 2017-04-20 (×5): qty 2

## 2017-04-20 MED ORDER — PHENYLEPHRINE HCL-NACL 10-0.9 MG/250ML-% IV SOLN
0.0000 ug/min | INTRAVENOUS | Status: DC
  Administered 2017-04-20: 20 ug/min via INTRAVENOUS
  Filled 2017-04-20: qty 250

## 2017-04-20 MED ORDER — FUROSEMIDE 10 MG/ML IJ SOLN
40.0000 mg | Freq: Two times a day (BID) | INTRAMUSCULAR | Status: AC
  Administered 2017-04-20 – 2017-04-21 (×3): 40 mg via INTRAVENOUS
  Filled 2017-04-20 (×3): qty 4

## 2017-04-20 MED ORDER — LEVOTHYROXINE SODIUM 75 MCG PO TABS
75.0000 ug | ORAL_TABLET | Freq: Every day | ORAL | Status: DC
  Administered 2017-04-21 – 2017-04-22 (×2): 75 ug
  Filled 2017-04-20 (×2): qty 1

## 2017-04-20 MED ORDER — DOPAMINE-DEXTROSE 3.2-5 MG/ML-% IV SOLN
0.0000 ug/kg/min | INTRAVENOUS | Status: DC
  Administered 2017-04-20: 5 ug/kg/min via INTRAVENOUS
  Filled 2017-04-20: qty 250

## 2017-04-20 MED ORDER — VITAL HIGH PROTEIN PO LIQD: 1000.0000 mL | ORAL | Status: DC

## 2017-04-20 MED ORDER — SODIUM CHLORIDE 0.9 % IV SOLN
25.0000 ug/h | INTRAVENOUS | Status: DC
  Administered 2017-04-20: 50 ug/h via INTRAVENOUS
  Administered 2017-04-21: 100 ug/h via INTRAVENOUS
  Administered 2017-04-22: 175 ug/h via INTRAVENOUS
  Filled 2017-04-20 (×3): qty 50

## 2017-04-20 MED ORDER — PANTOPRAZOLE SODIUM 40 MG PO PACK
40.0000 mg | PACK | Freq: Every day | ORAL | Status: DC
  Administered 2017-04-20 – 2017-04-22 (×3): 40 mg
  Filled 2017-04-20 (×3): qty 20

## 2017-04-20 MED ORDER — BUDESONIDE 0.5 MG/2ML IN SUSP
RESPIRATORY_TRACT | Status: AC
  Filled 2017-04-20: qty 2

## 2017-04-20 MED ORDER — PRO-STAT SUGAR FREE PO LIQD
30.0000 mL | Freq: Three times a day (TID) | ORAL | Status: DC
  Administered 2017-04-20 – 2017-04-21 (×4): 30 mL
  Filled 2017-04-20 (×4): qty 30

## 2017-04-20 MED ORDER — CHLORHEXIDINE GLUCONATE 0.12% ORAL RINSE (MEDLINE KIT)
15.0000 mL | Freq: Two times a day (BID) | OROMUCOSAL | Status: DC
  Administered 2017-04-20 – 2017-04-22 (×5): 15 mL via OROMUCOSAL

## 2017-04-20 MED ORDER — ARFORMOTEROL TARTRATE 15 MCG/2ML IN NEBU
INHALATION_SOLUTION | RESPIRATORY_TRACT | Status: AC
  Administered 2017-04-20: 15 ug via RESPIRATORY_TRACT
  Filled 2017-04-20: qty 2

## 2017-04-20 MED ORDER — VITAL AF 1.2 CAL PO LIQD
1000.0000 mL | ORAL | Status: DC
  Administered 2017-04-20 – 2017-04-21 (×2): 1000 mL
  Filled 2017-04-20 (×4): qty 1000

## 2017-04-20 MED ORDER — LORATADINE 10 MG PO TABS
10.0000 mg | ORAL_TABLET | Freq: Every day | ORAL | Status: DC
  Administered 2017-04-21 – 2017-04-22 (×2): 10 mg
  Filled 2017-04-20 (×2): qty 1

## 2017-04-20 MED ORDER — ACETAMINOPHEN 160 MG/5ML PO SOLN
650.0000 mg | Freq: Four times a day (QID) | ORAL | Status: DC | PRN
  Administered 2017-04-20: 650 mg
  Filled 2017-04-20: qty 20.3

## 2017-04-20 MED ORDER — ALBUTEROL SULFATE (2.5 MG/3ML) 0.083% IN NEBU: 2.5000 mg | INHALATION_SOLUTION | RESPIRATORY_TRACT | Status: DC | PRN

## 2017-04-20 MED ORDER — ORAL CARE MOUTH RINSE
15.0000 mL | Freq: Four times a day (QID) | OROMUCOSAL | Status: DC
  Administered 2017-04-20 – 2017-04-23 (×11): 15 mL via OROMUCOSAL

## 2017-04-20 NOTE — Progress Notes (Signed)
  Echocardiogram 2D Echocardiogram has been performed.  Merrie Roof F 04/20/2017, 3:46 PM

## 2017-04-20 NOTE — Progress Notes (Addendum)
RN temporarily stopped tube feeds. OG tube was 40 cm yesterday on initial insertion, now it is over 100 cm. Notified Elink. RN advanced OG and now measures 43 cm. Orders placed for STAT Abdominal Xray to confirm placement. RN will continue to monitor.

## 2017-04-20 NOTE — Care Management Note (Signed)
Case Management Note  Patient Details  Name: Jamie Hicks MRN: [id number] Date of Birth: 1956/04/22  Subjective/Objective:                  A-line Intubated Influenza a positive  Action/Plan: Date: April 20, 2017 Velva Harman, BSN, [place], [place] Chart and notes review for patient progress and needs. Will follow for case management and discharge needs. Next review date: [id number]  Expected Discharge Date:  (unknown)               Expected Discharge Plan:  [place]  In-House Referral:     Discharge planning Services  CM Consult  Post Acute Care Choice:    Choice offered to:     DME Arranged:    DME Agency:     HH Arranged:    HH Agency:     Status of Service:  In process, will continue to follow  If discussed at Long Length of Stay Meetings, dates discussed:    Additional Comments:  Leeroy Cha, RN 04/20/2017, 9:04 AM

## 2017-04-20 NOTE — Progress Notes (Signed)
[facility] Progress Note Patient Name: Jamie Hicks DOB: 1955/12/30 MRN: [id number]   Date of Service  04/20/2017  HPI/Events of Note  Hr dropping into 40s with sedation.   eICU Interventions  Change phenylephrine to dopamine.         Laverle Hobby 04/20/2017, 2:50 AM

## 2017-04-20 NOTE — Progress Notes (Signed)
PULMONARY / CRITICAL CARE MEDICINE   Name: Jamie Hicks MRN: [id number] DOB: [birth year]    ADMISSION DATE:  04/18/2017 CONSULTATION DATE:  11/6  REFERRING MD:  Dr. Dyann Kief  CHIEF COMPLAINT:  Resp distress  HISTORY OF PRESENT ILLNESS:   61 year old female with PMH as below, which is significant for HTN, morbid obesity, and hypothyroidism. She presented to [place] 11/5 with complaints of SOB, nonproductive cough, malaise, and muscle aches x 3 days. Upon arrival to the ED she was febrile. She underwent CT angio given concerns for PE. This was negative for PE, however, demonstrated LUL opacification thought to represent pneumonia. Rapid flu PCR positive for Flu A. She was admitted to SDU and the hospitalist team and was treated with IV antibiotics and tamiflu. Since that time she has become hypoxemic and dyspnea has continued to worsen. She was placed on BiPAP but remained hypoxemic. PCCM consulted.   At time of my exam patient is very somnolent with no response to sternal rub. RN reports patient had required a total of 2mg  of Ativan IV over the course of the evening as she had been agitated and not cooperating with the BIPAP but was hypoxic off the BIPAP. She is not currently protecting her airway well. She also remains hypercapneic even with the BIPAP in place. Therefore decision made to intubate. Patient's son and daughter at at bedside and consent to intubation.   SUBJECTIVE:    VITAL SIGNS: Vitals:   04/20/17 0700 04/20/17 0715 04/20/17 0730 04/20/17 0800  BP: 138/60   134/61  Pulse: (!) 55 (!) 54 (!) 54 (!) 50  Resp: (!) 30 (!) 30 20 (!) 30  Temp: 99.1 F (37.3 C) 99.1 F (37.3 C) 99.3 F (37.4 C) 99.1 F (37.3 C)  TempSrc:      SpO2: 100% 100% 100% 98%  Weight:      Height:        VENTILATOR SETTINGS: Vent Mode: PRVC FiO2 (%):  [50 %-100 %] 50 % Set Rate:  [8 bmp-30 bmp] 30 bmp Vt Set:  [400 mL] 400 mL PEEP:  [8 cmH20-10 cmH20] 10 cmH20 Plateau  Pressure:  [20 cmH20-22 cmH20] 22 cmH20  INTAKE / OUTPUT: I/O last 3 completed shifts: In: 1210.3 [P.O.:600; I.V.:310.3; IV Piggyback:300] Out: 3075 [Urine:3075]  PHYSICAL EXAMINATION: General: Obese female, unresponsive on BIPAP, Critically ill Neuro: no response to sternal rub, pupils pinpoint and equal b/l, not moving extremities HEENT: dry MM, poor dentition; BIPAP mask in place Cardiovascular: RRR  No m/r/g Lungs: CTA b/l, RR 22 Abdomen: Soft NTND Musculoskeletal: no LE edema Skin: no rashes   LABS:  BMET Recent Labs  Lab 04/19/17 0451 04/19/17 2033 04/20/17 0402  NA 137 133* 137  K 3.6 3.4* 3.3*  CL 104 99* 104  CO2 25 22 22   BUN 14 15 18   CREATININE 0.91 1.15* 1.17*  GLUCOSE 98 147* 108*   Electrolytes Recent Labs  Lab 04/19/17 0451 04/19/17 2033 04/20/17 0402  CALCIUM 8.5* 8.3* 8.3*   CBC Recent Labs  Lab 04/19/17 0451 04/19/17 2033 04/20/17 0402  WBC 6.0 9.7 6.1  HGB 9.7* 10.6* 10.1*  HCT 30.6* 33.1* 31.8*  PLT 166 200 154   Coag's No results for input(s): APTT, INR in the last 168 hours.  Sepsis Markers Recent Labs  Lab 04/19/17 2033 04/19/17 2332 04/19/17 2357 04/20/17 0402  LATICACIDVEN 1.8 1.8  --   --   PROCALCITON  --   --  10.70 17.71  ABG Recent Labs  Lab 04/19/17 2100 04/20/17 0007 04/20/17 0255  PHART 7.261* 7.265* 7.379  PCO2ART 56.5* 60.0* 41.2  PO2ART 86.5 184* 235*   Liver Enzymes Recent Labs  Lab 04/18/17 0950 04/19/17 0451 04/19/17 2033  AST 21 23 94*  ALT 17 16 46  ALKPHOS 76 63 89  BILITOT 0.7 0.6 0.5  ALBUMIN 4.2 3.6 3.6   Cardiac Enzymes No results for input(s): TROPONINI, PROBNP in the last 168 hours.  Glucose Recent Labs  Lab 04/20/17 0341 04/20/17 0741  GLUCAP 117* 105*    Imaging Dg Abd 1 View  Result Date: 04/19/2017 CLINICAL DATA:  OG tube placement EXAM: ABDOMEN - 1 VIEW COMPARISON:  05/15/2009, 04/19/2017 FINDINGS: Cardiomegaly with left lower lobe opacity. Esophageal tube tip  projects over the right abdomen, likely the proximal duodenum IMPRESSION: Esophageal tube tip visible in the right mid abdomen, probably within the duodenum. Electronically Signed   By: Donavan Foil M.D.   On: 04/19/2017 23:59   Dg Chest Port 1 View  Result Date: 04/20/2017 CLINICAL DATA:  Intubated EXAM: PORTABLE CHEST 1 VIEW COMPARISON:  04/19/2017 FINDINGS: Endotracheal tube tip is about 4 cm superior to carina. Esophageal tube tip is below the diaphragm. Cardiomegaly with diffuse bilateral interstitial and alveolar opacity consistent with edema, slightly decreased. Small left effusion. Dense left lower lobe consolidation. IMPRESSION: 1. Endotracheal tube tip about 4 cm superior to carina 2. Decreased interstitial and alveolar infiltrates or edema 3. Cardiomegaly. Dense left lower lobe consolidation and small left effusion. Electronically Signed   By: Donavan Foil M.D.   On: 04/20/2017 00:00   Dg Chest Port 1 View  Result Date: 04/19/2017 CLINICAL DATA:  Altered mental status an high fever. EXAM: PORTABLE CHEST 1 VIEW COMPARISON:  CT of the chest 04/18/2017 FINDINGS: The cardiac silhouette is enlarged. There is no evidence of pleural effusion or pneumothorax. Bilateral widespread interstitial and alveolar airspace opacities with central predominance. Osseous structures are without acute abnormality. Soft tissues are grossly normal. IMPRESSION: Interval development of bilateral widespread interstitial and alveolar opacities with central predominance. This may represent a mixed pattern rapidly evolving pulmonary edema or multifocal airspace consolidation. Electronically Signed   By: Fidela Salisbury M.D.   On: 04/19/2017 20:18   STUDIES:  CTA chest 11/5 > No evidence of pulmonary embolus. Mild cardiomegaly. Mild left upper lobe infiltrate or atelectasis is noted.  Bilateral diffuse groundglass bilateral diffuse groundglass CXR 11/6 > progression of infiltrates to diffuse bilateral    CULTURES: Blood 11/5 >  Urine 11/6 > Sputum 11/6 > Influenza PCR 11/5 > Flu B neg, Flu A POS  ANTIBIOTICS: Cefepime 11/5 > Vancomycin 11/5 > Tamiflu 11/5 > Azithromycin 11/5-11/6 Ceftriaxone 11/5-11/6  SIGNIFICANT EVENTS: 11/5 admit for flu pneumonia 11/6 progression to ARDS  LINES/TUBES: ETT 11/6 > Foley catheter 11/6>> PIV's 11/5>>   ASSESSMENT / PLAN: 61 year old female with hx HTN, morbid obesity, and hypothyroidism, presented 11/5 with SOB, nonproductive cough, malaise, fever, Influenza A.  Probable left upper lobe pneumonia.  Evolving infiltrates and respiratory failure, ARDS.  Intubated 11/6 p.m.  PULMONARY Acute hypoxemic/hypercarbic respiratory failure influenza A pneumonia;  Possible secondary bacterial pneumonia;  ARDS;  Possibly component pulmonary edema.  Continue mechanical ventilation, lung protective strategy, 6 cc/kg Titrate PEEP as FiO2 and oxygenation will allow, currently FiO2 0.50, PEEP 10 Follow chest x-ray Ventilator associated pneumonia prevention orders Echocardiogram to evaluate for cardiac contribution to pulmonary infiltrates Antibiotics, Tamiflu as below Diuresis as she can tolerate, goal CVP 5-8  Stop Brovana, Pulmicort Albuterol nebs as needed  CARDIOVASCULAR 1. Hypertensive urgency with history of hypertension Hydralazine as needed for blood pressure control Follow telemetry Echocardiogram ordered  RENAL Mild AKI;  Hypokalemia;  Hyponatremia: Avoid nephrotoxins Follow BMP, urine output Diuresis as blood pressure, renal function will tolerate > lasix 40mg  bid for 3 doses, and follow renal fxn  GASTROINTESTINAL Nutrition DVT prophylaxis Start trickle tube feeds GI prophylaxis as ordered  HEMATOLOGIC Anemia: Follow for evidence of blood loss Follow CBC Transfusion goal hemoglobin greater than 7 Subcutaneous heparin for DVT prophylaxis   INFECTIOUS Severe sepsis Flu A pneumonia Possible superimposed left upper lobe  bacterial pneumonia Tamiflu 150 mg twice daily Antibiotics escalated to vancomycin and cefepime on 11/5, continue same pending culture data and clinical stabilization Follow culture data, pro-calcitonin Droplet precautions  ENDOCRINE Hx Hypothyroidism: Continue Synthroid as ordered    NEUROLOGIC Acute encephalopathy, resolved Sedation for mechanical ventilation Tolerating fentanyl drip Add Versed pushes as needed and stop propofol   FAMILY  Spoke with son and daughter at bedside.  Explained current situation to them and also to the patient who is awake, interacting, writing notes.  - Inter-disciplinary family meet or Palliative Care meeting due by: 04/26/17  Independent CC time 35 minutes  Baltazar Apo, MD, PhD 04/20/2017, 10:15 AM [facility] [phone] or if no answer [phone]

## 2017-04-20 NOTE — Progress Notes (Signed)
Initial Nutrition Assessment  DOCUMENTATION CODES:   Obesity unspecified  INTERVENTION:  - Will order Vital AF 1.2 @ 20 mL/hr with 30 mL Prostat TID. This regimen will provide 876 kcal (72% minimum estimated kcal need), 81 grams of protein (61% estimated protein need), and 389 mL free water.  - Will monitor for ability to advance TF rate.   NUTRITION DIAGNOSIS:   Inadequate oral intake related to inability to eat as evidenced by NPO status.  GOAL:   Provide needs based on ASPEN/SCCM guidelines  MONITOR:   TF tolerance, Vent status, Weight trends, Labs  REASON FOR ASSESSMENT:   Ventilator, Consult Enteral/tube feeding initiation and management  ASSESSMENT:   61 year old female with PMH as below, which is significant for HTN, morbid obesity, and hypothyroidism. She presented to [facility] 11/5 with complaints of SOB, nonproductive cough, malaise, and muscle aches x 3 days. Upon arrival to the ED she was febrile. She underwent CT angio given concerns for PE. This was negative for PE, however, demonstrated LUL opacification thought to represent pneumonia.  BMI indicates obesity. Pt founds to be flu positive and, per rounds this AM, is on ARDS protocol. Pt was a Rapid Response last night. She was unable to tolerate BiPAP and was subsequently intubated with OGT placed. TF consult with note stating trickle feeds only today. Pt able to communicate by writing on a piece of paper. She denies abdominal pain or nausea and indicates that her stomach is empty and she feels hungry. Sister and son at bedside and ask questions about TF; answered all questions at this time.  Patient is currently intubated on ventilator support MV: 13 L/min Temp (24hrs), Avg:100.3 F (37.9 C), Min:98.8 F (37.1 C), Max:104 F (40 C) Propofol: none BP: 131/64 and MAP: 87  Medications reviewed; 40 mg IV Lasix x2 doses today and x1 dose tomorrow, 80 mg IV Lasix x1 dose yesterday, 75 mcg Synthroid per  OGT/day, 40 mg Protonix per OGT/day. Labs reviewed; CBGs: 117 and 105 mg/dL today, K: 3.3 mmol/L, creatinine: 1.17 mg/dL, Ca: 8.3 mg/dL, GFR: 57 mL/min.       NUTRITION - FOCUSED PHYSICAL EXAM: No fat wasting and no muscle wasting noted.   Diet Order:  No diet orders on file  EDUCATION NEEDS:   No education needs have been identified at this time  Skin:  Skin Assessment: Reviewed RN Assessment  Last BM:  11/6  Height:   Ht Readings from Last 1 Encounters:  04/19/17 5\' 9"  (1.753 m)    Weight:   Wt Readings from Last 1 Encounters:  04/19/17 244 lb 0.8 oz (110.7 kg)    Ideal Body Weight:  65.91 kg  BMI:  Body mass index is 36.04 kg/m.  Estimated Nutritional Needs:   Kcal:  1218-1550 (11-14 kcal/kg)  Protein:  132 grams (2 grams/kg IBW)  Fluid:  >/= 1.7 L/day     Jarome Matin, MS, RD, LDN, [facility] Inpatient Clinical Dietitian Pager # [phone] After hours/weekend pager # [phone]

## 2017-04-20 NOTE — Procedures (Signed)
Arterial Catheter Insertion Procedure Note Jamie Hicks [id number] 06-07-1956  Procedure: Insertion of Arterial Catheter  Indications: Blood pressure monitoring  Procedure Details Consent: Unable to obtain consent because of altered level of consciousness. Time Out: Verified patient identification, verified procedure, site/side was marked, verified correct patient position, special equipment/implants available, medications/allergies/relevent history reviewed, required imaging and test results available.  Performed  Maximum sterile technique was used including antiseptics, cap, gloves, gown, hand hygiene, mask and sheet. Skin prep: Chlorhexidine; local anesthetic administered 20 gauge catheter was inserted into right radial artery using the Seldinger technique.  Evaluation Blood flow good; BP tracing good. Complications: No apparent complications.   Rosann Auerbach 04/20/2017

## 2017-04-21 DIAGNOSIS — J80 Acute respiratory distress syndrome: Secondary | ICD-10-CM

## 2017-04-21 DIAGNOSIS — J9601 Acute respiratory failure with hypoxia: Secondary | ICD-10-CM

## 2017-04-21 LAB — URINE CULTURE: Culture: NO GROWTH

## 2017-04-21 LAB — RAPID URINE DRUG SCREEN, HOSP PERFORMED
Amphetamines: NOT DETECTED
Barbiturates: POSITIVE — AB
Benzodiazepines: POSITIVE — AB
COCAINE: NOT DETECTED
Opiates: NOT DETECTED
TETRAHYDROCANNABINOL: NOT DETECTED

## 2017-04-21 LAB — GLUCOSE, CAPILLARY
GLUCOSE-CAPILLARY: 93 mg/dL (ref 65–99)
GLUCOSE-CAPILLARY: 97 mg/dL (ref 65–99)
Glucose-Capillary: 88 mg/dL (ref 65–99)
Glucose-Capillary: 99 mg/dL (ref 65–99)
Glucose-Capillary: 108 mg/dL — ABNORMAL HIGH (ref 65–99)

## 2017-04-21 LAB — CBC
HCT: 28 % — ABNORMAL LOW (ref 36.0–46.0)
HEMOGLOBIN: 9.1 g/dL — AB (ref 12.0–15.0)
MCH: 26.7 pg (ref 26.0–34.0)
MCHC: 32.5 g/dL (ref 30.0–36.0)
MCV: 82.1 fL (ref 78.0–100.0)
Platelets: 164 10*3/uL (ref 150–400)
RBC: 3.41 MIL/uL — AB (ref 3.87–5.11)
RDW: 15.6 % — ABNORMAL HIGH (ref 11.5–15.5)
WBC: 4.1 10*3/uL (ref 4.0–10.5)

## 2017-04-21 LAB — BASIC METABOLIC PANEL
Anion gap: 8 (ref 5–15)
BUN: 27 mg/dL — ABNORMAL HIGH (ref 6–20)
CHLORIDE: 107 mmol/L (ref 101–111)
CO2: 23 mmol/L (ref 22–32)
CREATININE: 1.21 mg/dL — AB (ref 0.44–1.00)
Calcium: 8.5 mg/dL — ABNORMAL LOW (ref 8.9–10.3)
GFR calc non Af Amer: 48 mL/min — ABNORMAL LOW (ref >60)
GFR, EST AFRICAN AMERICAN: 55 mL/min — AB (ref >60)
Glucose, Bld: 91 mg/dL (ref 65–99)
POTASSIUM: 3.1 mmol/L — AB (ref 3.5–5.1)
Sodium: 138 mmol/L (ref 135–145)

## 2017-04-21 LAB — STREP PNEUMONIAE URINARY ANTIGEN: Strep Pneumo Urinary Antigen: NEGATIVE

## 2017-04-21 LAB — PHOSPHORUS: PHOSPHORUS: 2.1 mg/dL — AB (ref 2.5–4.6)

## 2017-04-21 LAB — MAGNESIUM: Magnesium: 1.6 mg/dL — ABNORMAL LOW (ref 1.7–2.4)

## 2017-04-21 LAB — PROCALCITONIN: PROCALCITONIN: 16.86 ng/mL

## 2017-04-21 MED ORDER — MAGNESIUM SULFATE 2 GM/50ML IV SOLN
2.0000 g | Freq: Once | INTRAVENOUS | Status: AC
  Administered 2017-04-21: 2 g via INTRAVENOUS
  Filled 2017-04-21: qty 50

## 2017-04-21 MED ORDER — DIPHENHYDRAMINE HCL 12.5 MG/5ML PO ELIX
12.5000 mg | ORAL_SOLUTION | Freq: Four times a day (QID) | ORAL | Status: DC | PRN
Start: 2017-04-21 — End: 2017-04-22
  Administered 2017-04-21: 12.5 mg
  Filled 2017-04-21: qty 5

## 2017-04-21 MED ORDER — HEPARIN SODIUM (PORCINE) 5000 UNIT/ML IJ SOLN
INTRAMUSCULAR | Status: AC
  Administered 2017-04-21: 5000 [IU]
  Filled 2017-04-21: qty 1

## 2017-04-21 MED ORDER — ADULT MULTIVITAMIN LIQUID CH
15.0000 mL | Freq: Every day | ORAL | Status: DC
  Administered 2017-04-21 – 2017-04-22 (×2): 15 mL
  Filled 2017-04-21 (×2): qty 15

## 2017-04-21 MED ORDER — POTASSIUM PHOSPHATES 15 MMOLE/5ML IV SOLN
30.0000 mmol | Freq: Once | INTRAVENOUS | Status: AC
  Administered 2017-04-21: 30 mmol via INTRAVENOUS
  Filled 2017-04-21: qty 10

## 2017-04-21 MED ORDER — PRO-STAT SUGAR FREE PO LIQD
60.0000 mL | Freq: Three times a day (TID) | ORAL | Status: DC
  Administered 2017-04-21 – 2017-04-22 (×3): 60 mL
  Filled 2017-04-21 (×3): qty 60

## 2017-04-21 MED ORDER — POTASSIUM CHLORIDE 10 MEQ/100ML IV SOLN
10.0000 meq | INTRAVENOUS | Status: AC
  Administered 2017-04-21 (×4): 10 meq via INTRAVENOUS
  Filled 2017-04-21 (×2): qty 100

## 2017-04-21 NOTE — Progress Notes (Signed)
RT placed the Pt on wean. The Pt is tolerating wean. She is able to follow simple commands and is alert. RT will continue to monitor

## 2017-04-21 NOTE — Progress Notes (Signed)
eLink Physician-Brief Progress Note Patient Name: Jamie Hicks DOB: September 18, 1955 MRN: [id number]   Date of Service  04/21/2017  HPI/Events of Note  Hypokalemia, Hypophos, hypomag  eICU Interventions  Potassium, phos, and mag replaced     Intervention Category Intermediate Interventions: Electrolyte abnormality - evaluation and management  Shirley Decamp 04/21/2017, 3:04 AM

## 2017-04-21 NOTE — Progress Notes (Signed)
[facility] Progress Note Patient Name: Carmella Kees Schrade DOB: 10-Jun-1956 MRN: [id number]   Date of Service  04/21/2017  HPI/Events of Note  pruritis   eICU Interventions  PRN benadryl 12.5 mg via tube q6hours     Intervention Category Minor Interventions: Routine modifications to care plan (e.g. PRN medications for pain, fever)  Francelia Mclaren 04/21/2017, 7:27 PM

## 2017-04-21 NOTE — Progress Notes (Signed)
PULMONARY / CRITICAL CARE MEDICINE   Name: Jamie Hicks MRN: [id number] DOB: Oct 22, 1955    ADMISSION DATE:  04/18/2017 CONSULTATION DATE:  11/6  REFERRING MD:  Dr. Dyann Kief  CHIEF COMPLAINT:  Resp distress  HISTORY OF PRESENT ILLNESS:   61 year old female with PMH as below, which is significant for HTN, morbid obesity, and hypothyroidism. She presented to [facility] 11/5 with complaints of SOB, nonproductive cough, malaise, and muscle aches x 3 days. Upon arrival to the ED she was febrile. She underwent CT angio given concerns for PE. This was negative for PE, however, demonstrated LUL opacification thought to represent pneumonia. Rapid flu PCR positive for Flu A. She was admitted to SDU and the hospitalist team and was treated with IV antibiotics and tamiflu. Since that time she has become hypoxemic and dyspnea has continued to worsen. She was placed on BiPAP but remained hypoxemic. PCCM consulted.   At time of my exam patient is very somnolent with no response to sternal rub. RN reports patient had required a total of 2mg  of Ativan IV over the course of the evening as she had been agitated and not cooperating with the BIPAP but was hypoxic off the BIPAP. She is not currently protecting her airway well. She also remains hypercapneic even with the BIPAP in place. Therefore decision made to intubate. Patient's son and daughter at at bedside and consent to intubation.   SUBJECTIVE:  RN reports no acute events.  Pt weaning on PSV since ~ 830 am, fentanyl turned off as well.  Net negative 1.5L with lasix in last 24 hours.  Tolerating TF.     VITAL SIGNS: Vitals:   04/21/17 0800 04/21/17 0900 04/21/17 0901 04/21/17 0915  BP: 127/65 (!) 144/74  138/65  Pulse: (!) 59 69  60  Resp: (!) 27 19  20   Temp: 99 F (37.2 C) 99 F (37.2 C)    TempSrc:      SpO2: 98% 99% 100% 100%  Weight:      Height:        VENTILATOR SETTINGS: Vent Mode: PSV;CPAP FiO2 (%):  [40 %-50 %] 40 % Set  Rate:  [30 bmp] 30 bmp Vt Set:  [400 mL] 400 mL PEEP:  [5 cmH20-8 cmH20] 5 cmH20 Pressure Support:  [10 cmH20] 10 cmH20 Plateau Pressure:  [18 cmH20-25 cmH20] 18 cmH20  INTAKE / OUTPUT: I/O last 3 completed shifts: In: 565.3 [I.V.:202.6; Other:60; NG/GT:152.7; IV Piggyback:150] Out: 3660 [Urine:3660]  PHYSICAL EXAMINATION: General: obese female in NAD on vent  HEENT: MM pink/moist, ETT Neuro: Awakens to voice, nods appropriately, follows commands  CV: s1s2 rrr, no m/r/g PULM: even/non-labored, lungs bilaterally coarse  RW:ERXV, non-tender, bsx4 active  Extremities: warm/dry, trace generalized edema  Skin: no rashes or lesions  LABS:  BMET Recent Labs  Lab 04/19/17 2033 04/20/17 0402 04/21/17 0222  NA 133* 137 138  K 3.4* 3.3* 3.1*  CL 99* 104 107  CO2 22 22 23   BUN 15 18 27*  CREATININE 1.15* 1.17* 1.21*  GLUCOSE 147* 108* 91   Electrolytes Recent Labs  Lab 04/19/17 2033 04/20/17 0402 04/21/17 0222  CALCIUM 8.3* 8.3* 8.5*  MG  --   --  1.6*  PHOS  --   --  2.1*   CBC Recent Labs  Lab 04/19/17 2033 04/20/17 0402 04/21/17 0222  WBC 9.7 6.1 4.1  HGB 10.6* 10.1* 9.1*  HCT 33.1* 31.8* 28.0*  PLT 200 154 164   Coag's No results for  input(s): APTT, INR in the last 168 hours.  Sepsis Markers Recent Labs  Lab 04/19/17 2033 04/19/17 2332 04/19/17 2357 04/20/17 0402 04/21/17 0222  LATICACIDVEN 1.8 1.8  --   --   --   PROCALCITON  --   --  10.70 17.71 16.86   ABG Recent Labs  Lab 04/19/17 2100 04/20/17 0007 04/20/17 0255  PHART 7.261* 7.265* 7.379  PCO2ART 56.5* 60.0* 41.2  PO2ART 86.5 184* 235*   Liver Enzymes Recent Labs  Lab 04/18/17 0950 04/19/17 0451 04/19/17 2033  AST 21 23 94*  ALT 17 16 46  ALKPHOS 76 63 89  BILITOT 0.7 0.6 0.5  ALBUMIN 4.2 3.6 3.6   Cardiac Enzymes No results for input(s): TROPONINI, PROBNP in the last 168 hours.  Glucose Recent Labs  Lab 04/20/17 1254 04/20/17 1542 04/20/17 1933 04/20/17 2348  04/21/17 0313 04/21/17 0825  GLUCAP 85 80 89 75 88 93    Imaging Dg Abd Portable 1v  Result Date: 04/20/2017 CLINICAL DATA:  OG tube placement EXAM: PORTABLE ABDOMEN - 1 VIEW COMPARISON:  04/19/2017 FINDINGS: Cardiomegaly. Consolidation at the left lung base. Esophageal tube tip overlies the mid abdomen. Nonobstructed gas pattern. IMPRESSION: Esophageal tube tip projects over the mid abdomen, possibly distal stomach. Contrast injection could be obtained to confirm intragastric location. Electronically Signed   By: Donavan Foil M.D.   On: 04/20/2017 22:20   STUDIES:  CTA chest 11/5 > No evidence of pulmonary embolus. Mild cardiomegaly. Mild left upper lobe infiltrate or atelectasis is noted.  Bilateral diffuse groundglass bilateral diffuse groundglass CXR 11/6 > progression of infiltrates to diffuse bilateral  ECHO 11/7 >> LV mild concentric hypertrophy, LVEF 35-40%, diffuse hypokinesis, LA severely dilated,   CULTURES: Blood 11/5 >> Urine 11/6 >> negative  Sputum 11/6 >> Influenza PCR 11/5 > Flu B neg, Flu A POS  ANTIBIOTICS: Cefepime 11/5 > Vancomycin 11/5 > Tamiflu 11/5 > Azithromycin 11/5-11/6 Ceftriaxone 11/5-11/6  SIGNIFICANT EVENTS: 11/5 admit for flu pneumonia 11/6 progression to ARDS  LINES/TUBES: ETT 11/6 >> Foley catheter 11/6>> PIV's 11/5 >> R Radial Aline 11/7 >> 11/8    ASSESSMENT / PLAN: 61 year old female with hx HTN, morbid obesity, and hypothyroidism, presented 11/5 with SOB, nonproductive cough, malaise, fever, Influenza A.  Probable left upper lobe pneumonia.  Evolving infiltrates and respiratory failure, ARDS.  Intubated 11/6 p.m.  PULMONARY Acute hypoxemic/hypercarbic respiratory failure influenza A pneumonia;  Possible secondary bacterial pneumonia;  ARDS;  Possibly component pulmonary edema.  P: PRVC 6 cc/kg, lung protective ventilation  Reduce baseline rate to 18 Wean PEEP/FiO2 as able  Follow CXR  Lasix as ordered, last dose this am.  CVP  goal 5-8 PRN albuterol  Continue abx, Tamiflu as ordered   CARDIOVASCULAR Acute CHF - LVEF 35-40%, ? Viral illness + underlying long term hypertensive physiology  Hypertensive urgency with history of hypertension P: ICU monitoring  Follow tele  May need Cardiology input at some point > at minimum follow up ECHO with PCP to review LVEF  RENAL Mild AKI Hypokalemia Hyponatremia P: Trend BMP / urinary output Replace electrolytes as indicated Avoid nephrotoxic agents, ensure adequate renal perfusion  GASTROINTESTINAL Nutrition P: TF per nutrition  GI prophylaxis with protonix  NPO  HEMATOLOGIC Anemia P: Follow BC Transfuse per ICU guidelines Heparin SQ for DVT prophylaxis   INFECTIOUS Severe sepsis Flu A pneumonia Possible superimposed left upper lobe bacterial pneumonia P: Continue Tamilfu as ordered  ABX as above  Follow cultures  Droplet precautions  ENDOCRINE Hx Hypothyroidism P: Continue synthroid    NEUROLOGIC Acute encephalopathy, resolved Sedation for mechanical ventilation P: Daily WUA / SBT  Fentanyl gtt as needed  PRN versed for sedation   FAMILY  - Family:  No family at bedside on am NP rounds 11/8   - Inter-disciplinary family meet or Palliative Care meeting due by: 04/26/17  CC Time: 91 minutes  Noe Gens, NP-C [facility] Pgr: [phone] or if no answer [phone] 04/21/2017, 11:23 AM

## 2017-04-21 NOTE — ED Provider Notes (Signed)
[facility]-ICU/STEPDOWN Provider Note   CSN: [id number] Arrival date & time: 04/18/17  0855     History   Chief Complaint Chief Complaint  Patient presents with  . Shortness of Breath    HPI Jamie Hicks is a 61 y.o. female.  HPI Patient is a 61-year-old female presents the emergency department 2 days of worsening cough and shortness of breath.  She reports fevers at home.  She was given nebulized treatment by EMS with some improvement.  She denies vomiting.  No diarrhea.  Denies abdominal pain.  No back pain.  No hemoptysis described.  She has a history of hypertension and thyroid disease.  She recently had her Synthroid changed and believes the dose could be too high.   Past Medical History:  Diagnosis Date  . Back pain   . Hypertension   . Thyroid disease     Patient Active Problem List   Diagnosis Date Noted  . Acute respiratory distress   . Community acquired pneumonia of left upper lobe of lung ([place])   . Hypoxemia   . Influenza A 04/18/2017  . Left upper lobe pneumonia ([place]) 04/18/2017  . Obesity, Class III, BMI 40-49.9 (morbid obesity) ([place]) 04/18/2017  . Thyroid activity decreased   . Cocaine dependence with cocaine-induced mood disorder ([place]) 07/22/2014  . Major depressive disorder, recurrent, severe without psychotic features ([place])   . Substance induced mood disorder ([place]) 07/19/2014  . Suicidal ideation   . Homicidal ideation   . Osteoarthritis 09/24/2012  . Prediabetes 09/24/2012  . Dyslipidemia 09/24/2012  . Vitamin D insufficiency 09/24/2012  . Hypothyroidism 09/22/2012  . Bilateral chronic knee pain 09/22/2012  . Smoker 09/22/2012  . Essential hypertension 09/22/2012    Past Surgical History:  Procedure Laterality Date  . HERNIA REPAIR    . knee arthorscopy      OB History    No data available       Home Medications    Prior to Admission medications   Medication Sig Start Date End Date Taking? Authorizing  Provider  levothyroxine (SYNTHROID, LEVOTHROID) 75 MCG tablet Take 75 mcg by mouth every morning. 05/29/16  Yes [provider]  amLODipine (NORVASC) 5 MG tablet Take 1 tablet (5 mg total) by mouth daily. For high blood pressure Patient not taking: Reported on 12/24/2014 07/30/14   Lindell Spar I, NP  cyclobenzaprine (FLEXERIL) 10 MG tablet Take 1 tablet (10 mg total) by mouth at bedtime. Patient not taking: Reported on 08/29/2016 05/25/16   Dalia Heading, PA-C  diphenhydrAMINE (BENADRYL) 25 MG tablet Take 1 tablet (25 mg total) by mouth every 6 (six) hours as needed. 08/29/16 09/03/16  Duffy Bruce, MD  hydrOXYzine (ATARAX/VISTARIL) 50 MG tablet Take 1 tablet (50 mg total) by mouth 3 (three) times daily as needed (anxiety). Patient not taking: Reported on 12/24/2014 07/30/14   Lindell Spar I, NP  ibuprofen (ADVIL,MOTRIN) 800 MG tablet Take 1 tablet (800 mg total) by mouth every 8 (eight) hours as needed. Patient not taking: Reported on 08/29/2016 05/25/16   Dalia Heading, PA-C  ipratropium (ATROVENT) 0.06 % nasal spray Place 2 sprays into both nostrils 4 (four) times daily. For allergies Patient not taking: Reported on 12/24/2014 07/30/14   Lindell Spar I, NP  levothyroxine (SYNTHROID, LEVOTHROID) 50 MCG tablet Take 1 tablet (50 mcg total) by mouth daily before breakfast. For low thyroid function Patient not taking: Reported on 04/18/2017 07/30/14   Lindell Spar I, NP  loratadine (CLARITIN) 10 MG tablet  Take 1 tablet (10 mg total) by mouth daily. (This medicine may be purchased from over there counter at yr local pharmacy): For allergies Patient not taking: Reported on 08/29/2016 07/30/14   Lindell Spar I, NP  nicotine (NICODERM CQ - DOSED IN MG/24 HOURS) 14 mg/24hr patch Place 1 patch (14 mg total) onto the skin daily. For nicotine addiction Patient not taking: Reported on 12/24/2014 07/30/14   Lindell Spar I, NP  ranitidine (ZANTAC) 150 MG tablet Take 1 tablet (150 mg total) by mouth 2  (two) times daily. Patient not taking: Reported on 04/18/2017 08/29/16 09/03/16  Duffy Bruce, MD  traMADol (ULTRAM) 50 MG tablet Take 1 tablet (50 mg total) by mouth every 6 (six) hours as needed for severe pain. Patient not taking: Reported on 04/18/2017 05/25/16   Dalia Heading, PA-C  zolpidem (AMBIEN) 5 MG tablet Take 1 tablet (5 mg total) by mouth at bedtime. For sleep Patient not taking: Reported on 12/24/2014 07/30/14   Encarnacion Slates, NP    Family History History reviewed. No pertinent family history.  Social History Social History   Tobacco Use  . Smoking status: Current Some Day Smoker    Types: Cigarettes  . Smokeless tobacco: Never Used  Substance Use Topics  . Alcohol use: Yes    Comment: occassionally  . Drug use: No     Allergies   Amoxicillin and Latex   Review of Systems Review of Systems  All other systems reviewed and are negative.    Physical Exam Updated Vital Signs BP (!) 147/73   Pulse 65   Temp 99.3 F (37.4 C)   Resp (!) 22   Ht 5' 9"  (1.753 m)   Wt 110.9 kg (244 lb 7.8 oz)   LMP 07/21/2014 Comment: current  SpO2 99%   BMI 36.10 kg/m   Physical Exam  Constitutional: She is oriented to person, place, and time. She appears well-developed and well-nourished. No distress.  HENT:  Head: Normocephalic and atraumatic.  Eyes: EOM are normal.  Neck: Normal range of motion.  Cardiovascular: Normal rate, regular rhythm and normal heart sounds.  Pulmonary/Chest: Effort normal and breath sounds normal.  Abdominal: Soft. She exhibits no distension. There is no tenderness.  Musculoskeletal: Normal range of motion.  Neurological: She is alert and oriented to person, place, and time.  Skin: Skin is warm and dry.  Psychiatric: She has a normal mood and affect. Judgment normal.  Nursing note and vitals reviewed.    ED Treatments / Results  Labs (all labs ordered are listed, but only abnormal results are displayed) Labs Reviewed    COMPREHENSIVE METABOLIC PANEL - Abnormal; Notable for the following components:      Result Value   Glucose, Bld 104 (*)    Total Protein 8.2 (*)    All other components within normal limits  CBC WITH DIFFERENTIAL/PLATELET - Abnormal; Notable for the following components:   Hemoglobin 10.9 (*)    HCT 34.0 (*)    Neutro Abs 8.5 (*)    Lymphs Abs 0.4 (*)    All other components within normal limits  D-DIMER, QUANTITATIVE (NOT AT [facility]) - Abnormal; Notable for the following components:   D-Dimer, Quant 1.00 (*)    All other components within normal limits  INFLUENZA PANEL BY PCR (TYPE A & B) - Abnormal; Notable for the following components:   Influenza A By PCR POSITIVE (*)    All other components within normal limits  TSH - Abnormal; Notable for the  following components:   TSH 22.555 (*)    All other components within normal limits  CBC - Abnormal; Notable for the following components:   RBC 3.66 (*)    Hemoglobin 9.7 (*)    HCT 30.6 (*)    RDW 15.6 (*)    All other components within normal limits  COMPREHENSIVE METABOLIC PANEL - Abnormal; Notable for the following components:   Calcium 8.5 (*)    All other components within normal limits  CBC - Abnormal; Notable for the following components:   RBC 3.79 (*)    Hemoglobin 10.1 (*)    HCT 31.8 (*)    RDW 15.7 (*)    All other components within normal limits  BASIC METABOLIC PANEL - Abnormal; Notable for the following components:   Potassium 3.3 (*)    Glucose, Bld 108 (*)    Creatinine, Ser 1.17 (*)    Calcium 8.3 (*)    GFR calc non Af Amer 50 (*)    GFR calc Af Amer 57 (*)    All other components within normal limits  BLOOD GAS, ARTERIAL - Abnormal; Notable for the following components:   pH, Arterial 7.123 (*)    pCO2 arterial 66.5 (*)    pO2, Arterial 79.5 (*)    Bicarbonate 19.9 (*)    Acid-base deficit 8.8 (*)    All other components within normal limits  CBC WITH DIFFERENTIAL/PLATELET - Abnormal; Notable for the  following components:   Hemoglobin 10.6 (*)    HCT 33.1 (*)    Neutro Abs 9.0 (*)    Lymphs Abs 0.4 (*)    All other components within normal limits  COMPREHENSIVE METABOLIC PANEL - Abnormal; Notable for the following components:   Sodium 133 (*)    Potassium 3.4 (*)    Chloride 99 (*)    Glucose, Bld 147 (*)    Creatinine, Ser 1.15 (*)    Calcium 8.3 (*)    AST 94 (*)    GFR calc non Af Amer 51 (*)    GFR calc Af Amer 59 (*)    All other components within normal limits  URINALYSIS, ROUTINE W REFLEX MICROSCOPIC - Abnormal; Notable for the following components:   Hgb urine dipstick MODERATE (*)    Protein, ur >=300 (*)    Bacteria, UA RARE (*)    Squamous Epithelial / LPF 0-5 (*)    All other components within normal limits  BLOOD GAS, ARTERIAL - Abnormal; Notable for the following components:   pH, Arterial 7.261 (*)    pCO2 arterial 56.5 (*)    Acid-base deficit 2.5 (*)    All other components within normal limits  BRAIN NATRIURETIC PEPTIDE - Abnormal; Notable for the following components:   B Natriuretic Peptide 379.4 (*)    All other components within normal limits  BLOOD GAS, ARTERIAL - Abnormal; Notable for the following components:   pH, Arterial 7.265 (*)    pCO2 arterial 60.0 (*)    pO2, Arterial 184 (*)    All other components within normal limits  BLOOD GAS, ARTERIAL - Abnormal; Notable for the following components:   pO2, Arterial 235 (*)    All other components within normal limits  GLUCOSE, CAPILLARY - Abnormal; Notable for the following components:   Glucose-Capillary 117 (*)    All other components within normal limits  GLUCOSE, CAPILLARY - Abnormal; Notable for the following components:   Glucose-Capillary 105 (*)    All other components within normal  limits  BASIC METABOLIC PANEL - Abnormal; Notable for the following components:   Potassium 3.1 (*)    BUN 27 (*)    Creatinine, Ser 1.21 (*)    Calcium 8.5 (*)    GFR calc non Af Amer 48 (*)    GFR  calc Af Amer 55 (*)    All other components within normal limits  MAGNESIUM - Abnormal; Notable for the following components:   Magnesium 1.6 (*)    All other components within normal limits  PHOSPHORUS - Abnormal; Notable for the following components:   Phosphorus 2.1 (*)    All other components within normal limits  CBC - Abnormal; Notable for the following components:   RBC 3.41 (*)    Hemoglobin 9.1 (*)    HCT 28.0 (*)    RDW 15.6 (*)    All other components within normal limits  CULTURE, BLOOD (ROUTINE X 2)  CULTURE, BLOOD (ROUTINE X 2)  URINE CULTURE  CULTURE, BLOOD (ROUTINE X 2)  CULTURE, BLOOD (ROUTINE X 2)  MRSA PCR SCREENING  CULTURE, RESPIRATORY (NON-EXPECTORATED)  GRAM STAIN  HIV ANTIBODY (ROUTINE TESTING)  LACTIC ACID, PLASMA  LACTIC ACID, PLASMA  TRIGLYCERIDES  PROCALCITONIN  PROCALCITONIN  GLUCOSE, CAPILLARY  GLUCOSE, CAPILLARY  GLUCOSE, CAPILLARY  PROCALCITONIN  GLUCOSE, CAPILLARY  GLUCOSE, CAPILLARY  GLUCOSE, CAPILLARY  GLUCOSE, CAPILLARY  GLUCOSE, CAPILLARY  RAPID URINE DRUG SCREEN, HOSP PERFORMED  STREP PNEUMONIAE URINARY ANTIGEN  LEGIONELLA PNEUMOPHILA SEROGP 1 UR AG    EKG  EKG Interpretation None       Radiology Dg Abd 1 View  Result Date: 04/19/2017 CLINICAL DATA:  OG tube placement EXAM: ABDOMEN - 1 VIEW COMPARISON:  05/15/2009, 04/19/2017 FINDINGS: Cardiomegaly with left lower lobe opacity. Esophageal tube tip projects over the right abdomen, likely the proximal duodenum IMPRESSION: Esophageal tube tip visible in the right mid abdomen, probably within the duodenum. Electronically Signed   By: Donavan Foil M.D.   On: 04/19/2017 23:59   Dg Chest Port 1 View  Result Date: 04/20/2017 CLINICAL DATA:  Intubated EXAM: PORTABLE CHEST 1 VIEW COMPARISON:  04/19/2017 FINDINGS: Endotracheal tube tip is about 4 cm superior to carina. Esophageal tube tip is below the diaphragm. Cardiomegaly with diffuse bilateral interstitial and alveolar opacity  consistent with edema, slightly decreased. Small left effusion. Dense left lower lobe consolidation. IMPRESSION: 1. Endotracheal tube tip about 4 cm superior to carina 2. Decreased interstitial and alveolar infiltrates or edema 3. Cardiomegaly. Dense left lower lobe consolidation and small left effusion. Electronically Signed   By: Donavan Foil M.D.   On: 04/20/2017 00:00   Dg Chest Port 1 View  Result Date: 04/19/2017 CLINICAL DATA:  Altered mental status an high fever. EXAM: PORTABLE CHEST 1 VIEW COMPARISON:  CT of the chest 04/18/2017 FINDINGS: The cardiac silhouette is enlarged. There is no evidence of pleural effusion or pneumothorax. Bilateral widespread interstitial and alveolar airspace opacities with central predominance. Osseous structures are without acute abnormality. Soft tissues are grossly normal. IMPRESSION: Interval development of bilateral widespread interstitial and alveolar opacities with central predominance. This may represent a mixed pattern rapidly evolving pulmonary edema or multifocal airspace consolidation. Electronically Signed   By: Fidela Salisbury M.D.   On: 04/19/2017 20:18   Dg Abd Portable 1v  Result Date: 04/20/2017 CLINICAL DATA:  OG tube placement EXAM: PORTABLE ABDOMEN - 1 VIEW COMPARISON:  04/19/2017 FINDINGS: Cardiomegaly. Consolidation at the left lung base. Esophageal tube tip overlies the mid abdomen. Nonobstructed gas pattern. IMPRESSION: Esophageal  tube tip projects over the mid abdomen, possibly distal stomach. Contrast injection could be obtained to confirm intragastric location. Electronically Signed   By: Donavan Foil M.D.   On: 04/20/2017 22:20    Procedures Procedures (including critical care time)  Medications Ordered in ED Medications  nicotine (NICODERM CQ - dosed in mg/24 hours) patch 14 mg (14 mg Transdermal Patch Applied 04/21/17 0917)  ipratropium-albuterol (DUONEB) 0.5-2.5 (3) MG/3ML nebulizer solution 3 mL (3 mLs Nebulization Given  04/21/17 0900)  heparin injection 5,000 Units (5,000 Units Subcutaneous Given 04/21/17 0421)  sodium chloride flush (NS) 0.9 % injection 3 mL (3 mLs Intravenous Given 04/21/17 0921)  sodium chloride flush (NS) 0.9 % injection 3 mL (not administered)  0.9 %  sodium chloride infusion (not administered)  hydrALAZINE (APRESOLINE) injection 10 mg (not administered)  dextromethorphan-guaiFENesin (MUCINEX DM) 30-600 MG per 12 hr tablet 1 tablet (1 tablet Oral Not Given 04/21/17 0916)  guaiFENesin-dextromethorphan (ROBITUSSIN DM) 100-10 MG/5ML syrup 5 mL (5 mLs Oral Given 04/19/17 0535)  labetalol (NORMODYNE,TRANDATE) injection 10 mg (not administered)  acetaminophen (TYLENOL) suppository 650 mg (650 mg Rectal Given 04/19/17 1956)  ceFEPIme (MAXIPIME) 1 g in dextrose 5 % 50 mL IVPB (0 g Intravenous Stopped 04/21/17 0803)  vancomycin (VANCOCIN) 1,250 mg in sodium chloride 0.9 % 250 mL IVPB (0 mg Intravenous Stopped 04/21/17 0050)  fentaNYL (SUBLIMAZE) injection 100 mcg (100 mcg Intravenous Given 04/20/17 0150)  fentaNYL (SUBLIMAZE) injection 100 mcg (100 mcg Intravenous Given 04/21/17 0550)  oseltamivir (TAMIFLU) capsule 150 mg (150 mg Per NG tube Given 04/21/17 0915)  acetaminophen (TYLENOL) solution 650 mg (650 mg Per Tube Given 04/20/17 0220)  chlorhexidine gluconate (MEDLINE KIT) (PERIDEX) 0.12 % solution 15 mL (15 mLs Mouth Rinse Given 04/21/17 0920)  MEDLINE mouth rinse (15 mLs Mouth Rinse Given 04/21/17 1152)  fentaNYL (SUBLIMAZE) 2,500 mcg in sodium chloride 0.9 % 250 mL (10 mcg/mL) infusion (0 mcg/hr Intravenous Stopped 04/21/17 0739)  albuterol (PROVENTIL) (2.5 MG/3ML) 0.083% nebulizer solution 2.5 mg (not administered)  levothyroxine (SYNTHROID, LEVOTHROID) tablet 75 mcg (75 mcg Per Tube Given 04/21/17 0916)  loratadine (CLARITIN) tablet 10 mg (10 mg Per Tube Given 04/21/17 0916)  furosemide (LASIX) injection 40 mg (40 mg Intravenous Given 04/21/17 0917)  midazolam (VERSED) injection 2 mg (2 mg Intravenous  Given 04/21/17 0211)  feeding supplement (VITAL AF 1.2 CAL) liquid 1,000 mL (1,000 mLs Per Tube Rate/Dose Verify 04/21/17 0000)  pantoprazole sodium (PROTONIX) 40 mg/20 mL oral suspension 40 mg (40 mg Per Tube Given 04/21/17 0918)  feeding supplement (PRO-STAT SUGAR FREE 64) liquid 60 mL (not administered)  multivitamin liquid 15 mL (not administered)  albuterol (PROVENTIL) (2.5 MG/3ML) 0.083% nebulizer solution 5 mg (5 mg Nebulization Given 04/18/17 0940)  acetaminophen (TYLENOL) tablet 1,000 mg (1,000 mg Oral Given 04/18/17 1017)  sodium chloride 0.9 % bolus 1,000 mL (0 mLs Intravenous Stopped 04/18/17 1155)  cefTRIAXone (ROCEPHIN) 1 g in dextrose 5 % 50 mL IVPB (0 g Intravenous Stopped 04/18/17 1104)  azithromycin (ZITHROMAX) 500 mg in dextrose 5 % 250 mL IVPB (0 mg Intravenous Stopped 04/18/17 1118)  iopamidol (ISOVUE-370) 76 % injection 100 mL (100 mLs Intravenous Contrast Given 04/18/17 1100)  oseltamivir (TAMIFLU) capsule 75 mg (75 mg Oral Given 04/18/17 1346)  ibuprofen (ADVIL,MOTRIN) tablet 600 mg (600 mg Oral Given 04/18/17 1651)  butalbital-acetaminophen-caffeine (FIORICET, ESGIC) 50-325-40 MG per tablet 2 tablet (2 tablets Oral Given 04/19/17 0006)  furosemide (LASIX) injection 80 mg (80 mg Intravenous Given by Other 04/19/17 1953)  metoprolol  tartrate (LOPRESSOR) injection 10 mg (10 mg Intravenous Given 04/19/17 1954)  LORazepam (ATIVAN) injection 1 mg (1 mg Intravenous Given by Other 04/19/17 1952)  LORazepam (ATIVAN) 2 MG/ML injection (  Duplicate 76/1/47 0929)  acetaminophen (TYLENOL) 650 MG suppository (  Duplicate 57/4/73 4037)  vancomycin (VANCOCIN) 2,000 mg in sodium chloride 0.9 % 500 mL IVPB (0 mg Intravenous Stopped 04/20/17 0118)  etomidate (AMIDATE) injection 30 mg (30 mg Intravenous Given 04/19/17 2330)  succinylcholine (ANECTINE) injection 100 mg (100 mg Intravenous Given 04/19/17 2330)  midazolam (VERSED) injection 2 mg (2 mg Intravenous Given 04/19/17 2330)  fentaNYL (SUBLIMAZE)  injection 100 mcg (100 mcg Intravenous Given 04/19/17 2330)  potassium PHOSPHATE 30 mmol in dextrose 5 % 500 mL infusion (0 mmol Intravenous Stopped 04/21/17 0953)  potassium chloride 10 mEq in 100 mL IVPB (0 mEq Intravenous Stopped 04/21/17 1020)  magnesium sulfate IVPB 2 g 50 mL (0 g Intravenous Stopped 04/21/17 0423)     Initial Impression / Assessment and Plan / ED Course  I have reviewed the triage vital signs and the nursing notes.  Pertinent labs & imaging results that were available during my care of the patient were reviewed by me and considered in my medical decision making (see chart for details).     Patient with hypoxia and pneumonia as well as positive for influenza.  Tamiflu.  IV antibiotics.  Admit to the hospital.  Final Clinical Impressions(s) / ED Diagnoses   Final diagnoses:  Community acquired pneumonia of left upper lobe of lung [facility])  Influenza A    ED Discharge Orders    None       Jola Schmidt, MD 04/21/17 1219

## 2017-04-21 NOTE — Progress Notes (Signed)
Nutrition Follow-up  DOCUMENTATION CODES:   Obesity unspecified  INTERVENTION:  - Continue Vital AF 1.2 @ 20 mL/hr and will increase Prostat order to 60 mL TID. This regimen will provide 1176 kcal (965 minimum estimated kcal need), 126 grams of protein (95% estimated protein need), and 389 mL free water. - Will monitor labs and advance TF rate as able. - Will order 15 mL liquid multivitamin per OGT.   Monitor magnesium, potassium, and phosphorus daily for at least 3 days, MD to replete as needed.   NUTRITION DIAGNOSIS:   Inadequate oral intake related to inability to eat as evidenced by NPO status. -ongoing  GOAL:   Provide needs based on ASPEN/SCCM guidelines -unmet at this time, will be met with change to TF regimen.   MONITOR:   TF tolerance, Vent status, Weight trends, Labs  ASSESSMENT:   61 year old female with PMH as below, which is significant for HTN, morbid obesity, and hypothyroidism. She presented to [facility] 11/5 with complaints of SOB, nonproductive cough, malaise, and muscle aches x 3 days. Upon arrival to the ED she was febrile. She underwent CT angio given concerns for PE. This was negative for PE, however, demonstrated LUL opacification thought to represent pneumonia.  11/8 Pt remains intubated with OGT in place. She is receiving Vital AF 1.2 @ 20 mL/hr with 30 mL Prostat TID which is providing 876 kcal, 81 grams of protein, and 389 mL free water. Weight stable from 11/6. Many family and hospital staff personnel in and out of room this AM; unable to check Ve at this time. Will follow-up when able.   Patient is currently intubated on ventilator support Temp (24hrs), Avg:99.5 F (37.5 C), Min:98.8 F (37.1 C), Max:100 F (37.8 C)  Medications reviewed; 40 mg IV Lasix BID, 75 mcg Synthroid per OGT/day, 2 g IV Mg sulfate x1 run today, 40 mg Protonix per OGT/day, 10 mEq IV KCl x4 runs today, 30 mool IV KPhos x1 run today. Labs reviewed; CBGs: 88 and 93  mg/dL this AM, K: 3.1 mmol/L, Phos: 2.1 mg/dL, Mg: 1.6 mg/dL, BUN: 27 mg/dL, creatinine: 1.21 mg/dL, Ca: 8.5 mg/dL, GFR: 55 mL/min.      11/7 - Pt found to be flu positive and, per rounds this AM, is on ARDS protocol.  - Pt was a Rapid Response last night.  - She was unable to tolerate BiPAP and was subsequently intubated with OGT placed.  - Pt able to communicate by writing on a piece of paper.  - She denies abdominal pain or nausea and indicates that her stomach is empty and she feels hungry.   Patient is currently intubated on ventilator support MV: 13 L/min Temp (24hrs), Avg:100.3 F (37.9 C), Min:98.8 F (37.1 C), Max:104 F (40 C) Propofol: none BP: 131/64 and MAP: 87    Diet Order:  No diet orders on file  EDUCATION NEEDS:   No education needs have been identified at this time  Skin:  Skin Assessment: Reviewed RN Assessment  Last BM:  11/6  Height:   Ht Readings from Last 1 Encounters:  04/19/17 '5\' 9"'$  (1.753 m)    Weight:   Wt Readings from Last 1 Encounters:  04/21/17 244 lb 7.8 oz (110.9 kg)    Ideal Body Weight:  65.91 kg  BMI:  Body mass index is 36.1 kg/m.  Estimated Nutritional Needs:   Kcal:  1218-1550 (11-14 kcal/kg)  Protein:  132 grams (2 grams/kg IBW)  Fluid:  >/= 1.7 L/day  Jarome Matin, MS, RD, LDN, [facility] Clinical Dietitian Pager # [phone] After hours/weekend pager # [phone]

## 2017-04-22 ENCOUNTER — Inpatient Hospital Stay (HOSPITAL_COMMUNITY): Payer: 59

## 2017-04-22 LAB — BASIC METABOLIC PANEL
Anion gap: 10 (ref 5–15)
BUN: 27 mg/dL — AB (ref 6–20)
CO2: 25 mmol/L (ref 22–32)
CREATININE: 1.14 mg/dL — AB (ref 0.44–1.00)
Calcium: 8.7 mg/dL — ABNORMAL LOW (ref 8.9–10.3)
Chloride: 105 mmol/L (ref 101–111)
GFR calc Af Amer: 59 mL/min — ABNORMAL LOW (ref >60)
GFR, EST NON AFRICAN AMERICAN: 51 mL/min — AB (ref >60)
GLUCOSE: 104 mg/dL — AB (ref 65–99)
POTASSIUM: 3.4 mmol/L — AB (ref 3.5–5.1)
Sodium: 140 mmol/L (ref 135–145)

## 2017-04-22 LAB — CBC
HEMATOCRIT: 30.5 % — AB (ref 36.0–46.0)
HEMOGLOBIN: 9.8 g/dL — AB (ref 12.0–15.0)
MCH: 26.3 pg (ref 26.0–34.0)
MCHC: 32.1 g/dL (ref 30.0–36.0)
MCV: 82 fL (ref 78.0–100.0)
Platelets: 186 10*3/uL (ref 150–400)
RBC: 3.72 MIL/uL — ABNORMAL LOW (ref 3.87–5.11)
RDW: 16 % — ABNORMAL HIGH (ref 11.5–15.5)
WBC: 4.4 10*3/uL (ref 4.0–10.5)

## 2017-04-22 LAB — GLUCOSE, CAPILLARY
GLUCOSE-CAPILLARY: 100 mg/dL — AB (ref 65–99)
GLUCOSE-CAPILLARY: 104 mg/dL — AB (ref 65–99)
Glucose-Capillary: 90 mg/dL (ref 65–99)
Glucose-Capillary: 90 mg/dL (ref 65–99)

## 2017-04-22 LAB — PROCALCITONIN: PROCALCITONIN: 5.94 ng/mL

## 2017-04-22 LAB — CULTURE, RESPIRATORY: GRAM STAIN: NONE SEEN

## 2017-04-22 LAB — CULTURE, RESPIRATORY W GRAM STAIN: Culture: NORMAL

## 2017-04-22 LAB — MAGNESIUM: Magnesium: 1.9 mg/dL (ref 1.7–2.4)

## 2017-04-22 MED ORDER — SODIUM CHLORIDE 0.9% FLUSH: 3.0000 mL | INTRAVENOUS | Status: DC | PRN

## 2017-04-22 MED ORDER — POTASSIUM CHLORIDE 20 MEQ/15ML (10%) PO SOLN
40.0000 meq | Freq: Once | ORAL | Status: AC
  Administered 2017-04-22: 40 meq
  Filled 2017-04-22: qty 30

## 2017-04-22 MED ORDER — POTASSIUM CHLORIDE 20 MEQ/15ML (10%) PO SOLN
40.0000 meq | Freq: Two times a day (BID) | ORAL | Status: DC
  Administered 2017-04-22 – 2017-04-23 (×2): 40 meq via ORAL
  Filled 2017-04-22 (×2): qty 30

## 2017-04-22 MED ORDER — PANTOPRAZOLE SODIUM 40 MG PO PACK: 40.0000 mg | PACK | Freq: Every day | ORAL | Status: DC

## 2017-04-22 MED ORDER — PHENOL 1.4 % MT LIQD
1.0000 | OROMUCOSAL | Status: DC | PRN
  Filled 2017-04-22: qty 177

## 2017-04-22 MED ORDER — SODIUM CHLORIDE 0.9 % IV SOLN: 250.0000 mL | INTRAVENOUS | Status: DC | PRN

## 2017-04-22 MED ORDER — FUROSEMIDE 10 MG/ML IJ SOLN
40.0000 mg | Freq: Three times a day (TID) | INTRAMUSCULAR | Status: DC
  Administered 2017-04-22 – 2017-04-24 (×6): 40 mg via INTRAVENOUS
  Filled 2017-04-22 (×5): qty 4

## 2017-04-22 MED ORDER — OSELTAMIVIR PHOSPHATE 75 MG PO CAPS
75.0000 mg | ORAL_CAPSULE | Freq: Two times a day (BID) | ORAL | Status: AC
  Administered 2017-04-22 – 2017-04-23 (×3): 75 mg via ORAL
  Filled 2017-04-22 (×3): qty 1

## 2017-04-22 MED ORDER — FUROSEMIDE 10 MG/ML IJ SOLN: 80.0000 mg | Freq: Three times a day (TID) | INTRAMUSCULAR | Status: DC

## 2017-04-22 MED ORDER — DIPHENHYDRAMINE HCL 12.5 MG/5ML PO ELIX: 12.5000 mg | ORAL_SOLUTION | Freq: Four times a day (QID) | ORAL | Status: DC | PRN

## 2017-04-22 MED ORDER — OSELTAMIVIR PHOSPHATE 75 MG PO CAPS
75.0000 mg | ORAL_CAPSULE | Freq: Two times a day (BID) | ORAL | Status: DC
  Filled 2017-04-22: qty 1

## 2017-04-22 MED ORDER — LEVOTHYROXINE SODIUM 25 MCG PO TABS
75.0000 ug | ORAL_TABLET | Freq: Every day | ORAL | Status: DC
  Administered 2017-04-23 – 2017-04-25 (×3): 75 ug via ORAL
  Filled 2017-04-22 (×2): qty 1

## 2017-04-22 MED ORDER — FUROSEMIDE 10 MG/ML IJ SOLN
INTRAMUSCULAR | Status: AC
  Filled 2017-04-22: qty 8

## 2017-04-22 MED ORDER — POTASSIUM CHLORIDE 20 MEQ/15ML (10%) PO SOLN
40.0000 meq | Freq: Two times a day (BID) | ORAL | Status: DC
  Administered 2017-04-22: 40 meq
  Filled 2017-04-22: qty 30

## 2017-04-22 MED ORDER — FUROSEMIDE 10 MG/ML IJ SOLN
40.0000 mg | Freq: Once | INTRAMUSCULAR | Status: AC
  Administered 2017-04-22: 40 mg via INTRAVENOUS

## 2017-04-22 MED ORDER — SODIUM CHLORIDE 0.9% FLUSH
3.0000 mL | Freq: Two times a day (BID) | INTRAVENOUS | Status: DC
  Administered 2017-04-22: 3 mL via INTRAVENOUS

## 2017-04-22 MED ORDER — ADULT MULTIVITAMIN LIQUID CH
15.0000 mL | Freq: Every day | ORAL | Status: DC
  Filled 2017-04-22: qty 15

## 2017-04-22 MED ORDER — FUROSEMIDE 10 MG/ML IJ SOLN
40.0000 mg | Freq: Three times a day (TID) | INTRAMUSCULAR | Status: DC
  Filled 2017-04-22: qty 4

## 2017-04-22 NOTE — Progress Notes (Signed)
Pt extubated to a 4L [place]. The doctor wanted the Pt to straight to BIPAP due to fluid. Pt was extubated and had a copious amount of secretions and the doctor ordered 40 more of lasix. The doctor said to wait and evaluate the Pt's secretion before RT places her on BIPAP. The nurse will call RT. Rt will continue monitor.

## 2017-04-22 NOTE — Progress Notes (Signed)
Pharmacy Antibiotic Note  Jamie Hicks is a 60 y.o. female admitted on 04/18/2017 with pneumonia, initially started on ceftriaxone and azithromycin.  Pharmacy has been consulted for Vancomycin, Cefepime dosing as the patient continues to spike fevers, rapid response for respiratory distress, and transfer to ICU-SD.  Noted Influenza A positive - on day #4/5 of oseltamivir Noted Amoxicillin allergy (rash), but tolerates Ceftriaxone  Plan:  D6 antibiotics  D5 Cefepime 1g IV q8h   Height: 5\' 9"  (175.3 cm) Weight: 232 lb 9.4 oz (105.5 kg) IBW/kg (Calculated) : 66.2  Temp (24hrs), Avg:99.7 F (37.6 C), Min:99.1 F (37.3 C), Max:100 F (37.8 C)  Recent Labs  Lab 04/19/17 0451 04/19/17 2033 04/19/17 2332 04/20/17 0402 04/21/17 0222 04/22/17 0353  WBC 6.0 9.7  --  6.1 4.1 4.4  CREATININE 0.91 1.15*  --  1.17* 1.21* 1.14*  LATICACIDVEN  --  1.8 1.8  --   --   --     Estimated Creatinine Clearance: 67.9 mL/min (A) (by C-G formula based on SCr of 1.14 mg/dL (H)).    Allergies  Allergen Reactions  . Amoxicillin     Has patient had a PCN reaction causing immediate rash, facial/tongue/throat swelling, SOB or lightheadedness with hypotension: yes Has patient had a PCN reaction causing severe rash involving mucus membranes or skin necrosis: no Has patient had a PCN reaction that required hospitalization: yes  Has patient had a PCN reaction occurring within the last 10 years: yes, 08/29/16 If all of the above answers are "NO", then may proceed with Cephalosporin use.   . Latex Rash   Antimicrobials this admission: 11/5 Ceftriaxone >> 11/6 11/5 Azithromycin >> 11/6 11/5 Oseltamivir >> 11/10 11/6 Cefepime >> 11/6 Vancomycin >> 11/9  Thank you for allowing pharmacy to be a part of this patient's care.  Minda Ditto PharmD Pager [phone] 04/22/2017, 2:45 PM

## 2017-04-22 NOTE — Procedures (Signed)
E-link notified about vent orders. Order to change rate confirmed.

## 2017-04-22 NOTE — Progress Notes (Signed)
eLink Physician-Brief Progress Note Patient Name: Jamie Hicks DOB: 1955-09-18 MRN: [id number]   Date of Service  04/22/2017  HPI/Events of Note  Hypokalemia  eICU Interventions  Potassium replaced     Intervention Category Intermediate Interventions: Electrolyte abnormality - evaluation and management  DETERDING,ELIZABETH 04/22/2017, 4:50 AM

## 2017-04-22 NOTE — Procedures (Signed)
Extubation Procedure Note  Patient Details:   Name: Jamie Hicks DOB: [birth year] MRN: [id number]   Airway Documentation:  Airway 7.5 mm (Active)  Secured at (cm) 24 cm 04/22/2017  7:40 AM  Measured From Lips 04/22/2017  7:40 AM  [place] 04/22/2017  7:40 AM  Secured By [employer] 04/22/2017  7:40 AM  Tube Holder Repositioned Yes 04/22/2017  7:40 AM  Cuff Pressure (cm H2O) 26 cm H2O 04/22/2017  7:40 AM  Site Condition Dry 04/22/2017  7:40 AM    Evaluation  O2 sats: stable throughout Complications: No apparent complications Patient did tolerate procedure well. Bilateral Breath Sounds: Clear, Diminished   Yes  Elsie Stain 04/22/2017, 11:23 AM

## 2017-04-22 NOTE — Progress Notes (Signed)
PULMONARY / CRITICAL CARE MEDICINE   Name: Jamie Hicks MRN: [id number] DOB: Feb 13, 1956    ADMISSION DATE:  04/18/2017 CONSULTATION DATE:  11/6  REFERRING MD:  Dr. Dyann Kief  CHIEF COMPLAINT:  Resp distress  HISTORY OF PRESENT ILLNESS:   61 year old female with PMH as below, which is significant for HTN, morbid obesity, and hypothyroidism. She presented to [facility] 11/5 with complaints of SOB, nonproductive cough, malaise, and muscle aches x 3 days. Upon arrival to the ED she was febrile. She underwent CT angio given concerns for PE. This was negative for PE, however, demonstrated LUL opacification thought to represent pneumonia. Rapid flu PCR positive for Flu A. She was admitted to SDU and the hospitalist team and was treated with IV antibiotics and tamiflu. Since that time she has become hypoxemic and dyspnea has continued to worsen. She was placed on BiPAP but remained hypoxemic. PCCM consulted.   At time of my exam patient is very somnolent with no response to sternal rub. RN reports patient had required a total of 2mg  of Ativan IV over the course of the evening as she had been agitated and not cooperating with the BIPAP but was hypoxic off the BIPAP. She is not currently protecting her airway well. She also remains hypercapneic even with the BIPAP in place. Therefore decision made to intubate. Patient's son and daughter at at bedside and consent to intubation.   SUBJECTIVE:  RN reports pt has on PSV wean.  Off sedation for ~45 minutes.  No acute events overnight.      VITAL SIGNS: Vitals:   04/22/17 0330 04/22/17 0503 04/22/17 0740 04/22/17 0802  BP: 98/61 106/60  (!) 155/77  Pulse: 63 (!) 56 67 68  Resp: 13 (!) 30 16 13   Temp: 99.7 F (37.6 C) 99.5 F (37.5 C)  99.5 F (37.5 C)  TempSrc:    Core (Comment)  SpO2:   98% 98%  Weight:      Height:        VENTILATOR SETTINGS: Vent Mode: PSV;CPAP FiO2 (%):  [30 %-40 %] 30 % Set Rate:  [16 bmp-30 bmp] 16 bmp Vt  Set:  [400 mL] 400 mL PEEP:  [5 cmH20] 5 cmH20 Pressure Support:  [10 cmH20] 10 cmH20 Plateau Pressure:  [18 cmH20-19 cmH20] 18 cmH20  INTAKE / OUTPUT: I/O last 3 completed shifts: In: 548 [I.V.:155.3; NG/GT:392.7] Out: 2536 [Urine:2535; Stool:1]  PHYSICAL EXAMINATION: General: well developed adult female in NAD on vent HEENT: MM pink/moist, ETT Neuro:  Awake, alert, follows commands, MAE CV: s1s2 rrr, no m/r/g PULM: even/non-labored, lungs bilaterally clear, diminished lower, pulling Vt 450-600 on PSV  YY:TKPT, non-tender, bsx4 active  Extremities: warm/dry, no edema  Skin: no rashes or lesions  LABS:  BMET Recent Labs  Lab 04/20/17 0402 04/21/17 0222 04/22/17 0353  NA 137 138 140  K 3.3* 3.1* 3.4*  CL 104 107 105  CO2 22 23 25   BUN 18 27* 27*  CREATININE 1.17* 1.21* 1.14*  GLUCOSE 108* 91 104*   Electrolytes Recent Labs  Lab 04/20/17 0402 04/21/17 0222 04/22/17 0353  CALCIUM 8.3* 8.5* 8.7*  MG  --  1.6* 1.9  PHOS  --  2.1*  --    CBC Recent Labs  Lab 04/20/17 0402 04/21/17 0222 04/22/17 0353  WBC 6.1 4.1 4.4  HGB 10.1* 9.1* 9.8*  HCT 31.8* 28.0* 30.5*  PLT 154 164 186   Coag's No results for input(s): APTT, INR in the last 168 hours.  Sepsis Markers Recent Labs  Lab 04/19/17 2033 04/19/17 2332 04/19/17 2357 04/20/17 0402 04/21/17 0222  LATICACIDVEN 1.8 1.8  --   --   --   PROCALCITON  --   --  10.70 17.71 16.86   ABG Recent Labs  Lab 04/19/17 2100 04/20/17 0007 04/20/17 0255  PHART 7.261* 7.265* 7.379  PCO2ART 56.5* 60.0* 41.2  PO2ART 86.5 184* 235*   Liver Enzymes Recent Labs  Lab 04/18/17 0950 04/19/17 0451 04/19/17 2033  AST 21 23 94*  ALT 17 16 46  ALKPHOS 76 63 89  BILITOT 0.7 0.6 0.5  ALBUMIN 4.2 3.6 3.6   Cardiac Enzymes No results for input(s): TROPONINI, PROBNP in the last 168 hours.  Glucose Recent Labs  Lab 04/21/17 1140 04/21/17 1614 04/21/17 2101 04/22/17 0020 04/22/17 0344 04/22/17 0800  GLUCAP 97  99 108* 90 100* 104*    Imaging Dg Chest Port 1 View  Result Date: 04/22/2017 CLINICAL DATA:  Respiratory failure.  Endotracheal tube EXAM: PORTABLE CHEST 1 VIEW COMPARISON:  Three days ago FINDINGS: Endotracheal tube tip at the clavicular heads. An orogastric tube reaches the stomach. Cardiopericardial enlargement that is stable. Worsening aeration at the right base where the diaphragm is less well defined. Bilateral streaky perihilar opacity. IMPRESSION: 1. Stable positioning of endotracheal and orogastric tubes. 2. Influenza with perihilar atelectasis and bronchopneumonia. Mild worsening aeration at the right base. 3. Cardiomegaly. Electronically Signed   By: Monte Fantasia M.D.   On: 04/22/2017 06:37   STUDIES:  CTA chest 11/5 > No evidence of pulmonary embolus. Mild cardiomegaly. Mild left upper lobe infiltrate or atelectasis is noted.  Bilateral diffuse groundglass bilateral diffuse groundglass CXR 11/6 > progression of infiltrates to diffuse bilateral  ECHO 11/7 >> LV mild concentric hypertrophy, LVEF 35-40%, diffuse hypokinesis, LA severely dilated,   CULTURES: Blood 11/5 >> Urine 11/6 >> negative  Sputum 11/6 >> Influenza PCR 11/5 > Flu B neg, Flu A POS  ANTIBIOTICS: Cefepime 11/5 > Vancomycin 11/5 > Tamiflu 11/5 > Azithromycin 11/5-11/6 Ceftriaxone 11/5-11/6  SIGNIFICANT EVENTS: 11/5 admit for flu pneumonia 11/6 progression to ARDS  LINES/TUBES: ETT 11/6 >> Foley catheter 11/6>> PIV's 11/5 >> R Radial Aline 11/7 >> 11/8    ASSESSMENT / PLAN: 61 year old female with hx HTN, morbid obesity, and hypothyroidism, presented 11/5 with SOB, nonproductive cough, malaise, fever, Influenza A.  Probable left upper lobe pneumonia.  Evolving infiltrates and respiratory failure, ARDS.  Intubated 11/6 p.m.  PULMONARY Acute hypoxemic/hypercarbic respiratory failure influenza A pneumonia;  Possible secondary bacterial pneumonia;  ARDS;  Possibly component pulmonary edema.   P: SBT with plan for extubation  Follow CXR  Pulmonary hygiene post extubation - IS, mobilize  PRN albuterol  Continue antibiotics as above  Tamiflu   CARDIOVASCULAR Acute CHF - LVEF 35-40%, ? Viral illness + underlying long term hypertensive physiology  Hypertensive urgency with history of hypertension P: ICU monitoring  Follow tele  May need Cardiology input at some point, at minimum follow up ECHO with PCP to review LVEF   RENAL Mild AKI Hypokalemia Hyponatremia P: Trend BMP / urinary output Replace electrolytes as indicated, KCL 11/9 Avoid nephrotoxic agents, ensure adequate renal perfusion  GASTROINTESTINAL Nutrition P: Hold TF in anticipation of extubation PPI for SUP > transition to zantac 11/10 (home med) NPO x4 hours, then can advance to heart healthy diet as tolerated    HEMATOLOGIC Anemia P: Follow CBC  Heparin for DVT prophylaxis   INFECTIOUS Severe sepsis Flu A pneumonia Possible  superimposed left upper lobe bacterial pneumonia P: Continue Tamiflu  ABX as above  Follow cultures  Droplet precautions   ENDOCRINE Hx Hypothyroidism P: Continue synthroid     NEUROLOGIC Acute encephalopathy, resolved Sedation for mechanical ventilation P: SBT with plan for extubation  PT consult for post extubation  Mobilize / OOB     FAMILY  - Family:  Family asleep in chair at time of NP rounding.  Will update when they are awake.  Patient updated on plan of care.  - Inter-disciplinary family meet or Palliative Care meeting due by: 04/26/17  CC Time: 53 minutes   Noe Gens, NP-C [facility] Pgr: [phone] or if no answer [phone] 04/22/2017, 10:14 AM

## 2017-04-22 NOTE — Progress Notes (Signed)
eLink Physician-Brief Progress Note Patient Name: Jamie Hicks DOB: [birth year] MRN: [id number]   Date of Service  04/22/2017  HPI/Events of Note  Patient extubated today. Request for Chloraseptic spray for throat soreness. Patient is on meds via tube feeds that need to be changed now that she is extubated.  eICU Interventions  Stable on camera check Order Chloraseptic Spray Bedside swallow eval and advance diet as tolerated. Change meds to PO     Intervention Category Intermediate Interventions: Other:  Mckinsey Keagle 04/22/2017, 8:33 PM

## 2017-04-22 NOTE — Progress Notes (Signed)
Assessed patient at bedside post extubation.  Son at bedside.  Patient awake, alert, oriented / speech clear.  Improved cough but remains weak.  Able to clear secretions.  Percussion initiated.  1.5L output with am dosing of lasix.  Will continue to monitor.    Noe Gens, NP-C [facility] Pgr: [phone] or if no answer [phone] 04/22/2017, 2:21 PM

## 2017-04-22 NOTE — Progress Notes (Signed)
Nutrition Follow-up  DOCUMENTATION CODES:   Obesity unspecified  INTERVENTION:  - Diet advancement as medically feasible if pt successful extubated. - RD will continue to monitor for needs.   NUTRITION DIAGNOSIS:   Inadequate oral intake related to inability to eat as evidenced by NPO status. -ongoing  GOAL:   Provide needs based on ASPEN/SCCM guidelines -previously met with TF, unmet with TF currently on hold pending extubation.   MONITOR:   Vent status, Weight trends, Labs  ASSESSMENT:   61 year old female with PMH as below, which is significant for HTN, morbid obesity, and hypothyroidism. She presented to [facility] 11/5 with complaints of SOB, nonproductive cough, malaise, and muscle aches x 3 days. Upon arrival to the ED she was febrile. She underwent CT angio given concerns for PE. This was negative for PE, however, demonstrated LUL opacification thought to represent pneumonia.  11/9 TF order changed by this RD yesterday to Vital AF 1.2 @ 20 mL/hr with 60 mL Prostat TID. This regimen provides 1176 kcal (96% minimum estimated kcal need), 126 grams of protein (95% estimated protein need), and 389 mL free water. Pt remains intubated with OGT in place. TF on hold at this time and RN reports that TF placed on hold ~15 minutes prior to RD visit; anticipation for extubation today. Weight trending down and current weight is -7 lbs/3.3 kg compared to admission weight. Will update estimated nutrition needs at follow-up.   Patient is currently intubated on ventilator support MV: 7.4 L/min Temp (24hrs), Avg:99.8 F (37.7 C), Min:99.3 F (37.4 C), Max:100 F (37.8 C) BP: 118/65 and MAP: 83  Medications reviewed; 40 mg IV Lasix BID, 75 mcg Synthroid per OGT/day, daily multivitamin with minerals per OGT/day, 40 mg Protonix per OGT/day. Labs reviewed; CBGs: 90, 100, and 104 mg/dL this AM, K: 3.4 mmol/L, BUN: 27 mg/dL, creatinine: 1.14 mg/dL, Ca: 8.7 mg/dL, GFR: 59 mL/min.      11/8 - Pt remains intubated with OGT in place.  - She is receiving Vital AF 1.2 @ 20 mL/hr with 30 mL Prostat TID which is providing 876 kcal, 81 grams of protein, and 389 mL free water.  - Weight stable from 11/6.  - Many family and hospital staff personnel in and out of room this AM; unable to check Ve at this time.   Patient is currently intubated on ventilator support Temp (24hrs), Avg:99.5 F (37.5 C), Min:98.8 F (37.1 C), Max:100 F (37.8 C)  Medications: 2 g IV Mg sulfate x1 run today, 10 mEq IV KCl x4 runs today, 30 mool IV KPhos x1 run today Labs: K: 3.1 mmol/L, Phos: 2.1 mg/dL, Mg: 1.6 mg/dL      11/7 - Pt found to be flu positive and, per rounds this AM, is on ARDS protocol.  - Pt was a Rapid Response last night.  - She was unable to tolerate BiPAP and was subsequently intubated with OGT placed.  - Pt able to communicate by writing on a piece of paper.  - She denies abdominal pain or nausea and indicates that her stomach is empty and she feels hungry.   Patient is currently intubated on ventilator support MV:13L/min Temp (24hrs), Avg:100.3 F (37.9 C), Min:98.8 F (37.1 C), Max:104 F (40 C) Propofol:none BP: 131/64 and MAP: 87      Diet Order:  No diet orders on file  EDUCATION NEEDS:   No education needs have been identified at this time  Skin:  Skin Assessment: Reviewed RN Assessment  Last BM:  11/7  Height:   Ht Readings from Last 1 Encounters:  04/19/17 5' 9"  (1.753 m)    Weight:   Wt Readings from Last 1 Encounters:  04/22/17 232 lb 9.4 oz (105.5 kg)    Ideal Body Weight:  65.91 kg  BMI:  Body mass index is 34.35 kg/m.  Estimated Nutritional Needs:   Kcal:  1218-1550 (11-14 kcal/kg)  Protein:  132 grams (2 grams/kg IBW)  Fluid:  >/= 1.7 L/day      Jarome Matin, MS, RD, LDN, [facility] Inpatient Clinical Dietitian Pager # [phone] After hours/weekend pager # [phone]

## 2017-04-23 ENCOUNTER — Inpatient Hospital Stay (HOSPITAL_COMMUNITY): Payer: 59

## 2017-04-23 LAB — CULTURE, BLOOD (ROUTINE X 2)
CULTURE: NO GROWTH
Culture: NO GROWTH
SPECIAL REQUESTS: ADEQUATE
Special Requests: ADEQUATE

## 2017-04-23 LAB — GLUCOSE, CAPILLARY: Glucose-Capillary: 107 mg/dL — ABNORMAL HIGH (ref 65–99)

## 2017-04-23 LAB — CBC
HEMATOCRIT: 34.7 % — AB (ref 36.0–46.0)
HEMOGLOBIN: 11.2 g/dL — AB (ref 12.0–15.0)
MCH: 27 pg (ref 26.0–34.0)
MCHC: 32.3 g/dL (ref 30.0–36.0)
MCV: 83.6 fL (ref 78.0–100.0)
Platelets: 223 10*3/uL (ref 150–400)
RBC: 4.15 MIL/uL (ref 3.87–5.11)
RDW: 16.2 % — ABNORMAL HIGH (ref 11.5–15.5)
WBC: 5 10*3/uL (ref 4.0–10.5)

## 2017-04-23 LAB — BASIC METABOLIC PANEL
Anion gap: 11 (ref 5–15)
BUN: 27 mg/dL — AB (ref 6–20)
CHLORIDE: 106 mmol/L (ref 101–111)
CO2: 27 mmol/L (ref 22–32)
Calcium: 9.5 mg/dL (ref 8.9–10.3)
Creatinine, Ser: 1.07 mg/dL — ABNORMAL HIGH (ref 0.44–1.00)
GFR calc Af Amer: >60 mL/min (ref >60)
GFR calc non Af Amer: 55 mL/min — ABNORMAL LOW (ref >60)
GLUCOSE: 100 mg/dL — AB (ref 65–99)
POTASSIUM: 4.2 mmol/L (ref 3.5–5.1)
Sodium: 144 mmol/L (ref 135–145)

## 2017-04-23 LAB — LEGIONELLA PNEUMOPHILA SEROGP 1 UR AG
L. PNEUMOPHILA SEROGP 1 UR AG: NEGATIVE
L. pneumophila Serogp 1 Ur Ag: NEGATIVE

## 2017-04-23 LAB — PHOSPHORUS: Phosphorus: 3.6 mg/dL (ref 2.5–4.6)

## 2017-04-23 LAB — TROPONIN I: Troponin I: 0.06 ng/mL (ref <0.03)

## 2017-04-23 LAB — MAGNESIUM: MAGNESIUM: 2.1 mg/dL (ref 1.7–2.4)

## 2017-04-23 MED ORDER — FAMOTIDINE 20 MG PO TABS
20.0000 mg | ORAL_TABLET | Freq: Every day | ORAL | Status: DC
  Administered 2017-04-23 – 2017-04-25 (×2): 20 mg via ORAL
  Filled 2017-04-23 (×2): qty 1

## 2017-04-23 MED ORDER — ADULT MULTIVITAMIN W/MINERALS CH
1.0000 | ORAL_TABLET | Freq: Every day | ORAL | Status: DC
  Administered 2017-04-23 – 2017-04-25 (×3): 1 via ORAL
  Filled 2017-04-23 (×4): qty 1

## 2017-04-23 MED ORDER — POTASSIUM CHLORIDE CRYS ER 20 MEQ PO TBCR
40.0000 meq | EXTENDED_RELEASE_TABLET | Freq: Two times a day (BID) | ORAL | Status: DC
  Administered 2017-04-23 – 2017-04-24 (×2): 40 meq via ORAL
  Filled 2017-04-23 (×2): qty 2

## 2017-04-23 MED ORDER — IPRATROPIUM-ALBUTEROL 0.5-2.5 (3) MG/3ML IN SOLN
3.0000 mL | Freq: Three times a day (TID) | RESPIRATORY_TRACT | Status: DC
  Administered 2017-04-24 (×2): 3 mL via RESPIRATORY_TRACT
  Filled 2017-04-23 (×3): qty 3

## 2017-04-23 MED ORDER — ORAL CARE MOUTH RINSE
15.0000 mL | Freq: Two times a day (BID) | OROMUCOSAL | Status: DC
  Administered 2017-04-23 – 2017-04-25 (×4): 15 mL via OROMUCOSAL

## 2017-04-23 NOTE — Progress Notes (Signed)
Pt placed to bed, stable condition, O2 placed on 2 liters, VSs, denies pain, inct for B/B. Tele #22 placed by NT and verified, Skin check completed and verified. Will cont to monitor. SRP, RN

## 2017-04-23 NOTE — Evaluation (Signed)
Physical Therapy Evaluation Patient Details Name: Jamie Hicks MRN: [id number] DOB: [birth year] Today's Date: 04/23/2017   History of Present Illness  61 year old female with PMH as below, which is significant for HTN, morbid obesity, and hypothyroidism. She presented to [facility] 11/5 with complaints of SOB, nonproductive cough, malaise, and muscle aches x 3 days. Upon arrival to the ED she was febrile., subsequently required intubation. negative PE, positive for flu.   Clinical Impression  Patient presents with bilateral  Should dysfunction/inability to raise  UE's  And painful with PROM. Elbows and hands WFL.Marland Kitchen Recommend OT consult.  Patient able to stand with 2 assist, Le's appear grossly [facility] as opposed to bilateral UE.  Pt admitted with above diagnosis. Pt currently with functional limitations due to the deficits listed below (see PT Problem List). Pt will benefit from skilled PT to increase their independence and safety with mobility to allow discharge to the venue listed below.     Follow Up Recommendations SNF    Equipment Recommendations  Rolling walker with 5" wheels    Recommendations for Other Services   OT    Precautions / Restrictions Precautions Precautions: Fall Precaution Comments: bilateral shoulder pain and weakness, new onset Restrictions Weight Bearing Restrictions: No      Mobility  Bed Mobility Overal bed mobility: Needs Assistance Bed Mobility: Rolling;Sidelying to Sit;Sit to Supine Rolling: Mod assist Sidelying to sit: Mod assist   Sit to supine: Mod assist   General bed mobility comments: assist with reaching hand to rail when rolling. patient self assisted legs to bed edge , assist with trunk  to sitting upright. Self assisted legs onto bed.  Transfers Overall transfer level: Needs assistance Equipment used: 2 person hand held assist Transfers: Sit to/from Stand Sit to Stand: Mod assist;+2 safety/equipment;+2 physical  assistance;From elevated surface         General transfer comment: Daughter present and assisted to stand patient from bed with 2 arm hold assist. patient able to take small side steps along the bed  x 4. slight knee buckling.  Ambulation/Gait                Stairs            Wheelchair Mobility    Modified Rankin (Stroke Patients Only)       Balance Overall balance assessment: Needs assistance Sitting-balance support: Feet supported;Bilateral upper extremity supported Sitting balance-Leahy Scale: Fair     Standing balance support: During functional activity;Bilateral upper extremity supported Standing balance-Leahy Scale: Poor                               Pertinent Vitals/Pain Pain Assessment: Faces Faces Pain Scale: Hurts even more Pain Location: when assisted ROM to right and left shoulder elevation as patient unable to  lift arms Pain Descriptors / Indicators: Discomfort;Grimacing;Jabbing;Sharp Pain Intervention(s): Monitored during session    Home Living Family/patient expects to be discharged to:: Private residence Living Arrangements: Children;Other relatives Available Help at Discharge: Family Type of Home: Apartment Home Access: Stairs to enter   Technical brewer of Steps: 3 Home Layout: Two level;Bed/bath upstairs Home Equipment: None      Prior Function Level of Independence: Independent               Hand Dominance        Extremity/Trunk Assessment   Upper Extremity Assessment Upper Extremity Assessment: RUE deficits/detail;LUE deficits/detail RUE Deficits / Details:  unable to flex shoulder, unable to hold against gravity when  placed, elbow and hand strength WFL. LUE Deficits / Details: similar to right but does have slight increased strength when placed to hold  arm with shoulder flexed at 90*    Lower Extremity Assessment Lower Extremity Assessment: Generalized weakness;RLE deficits/detail;LLE  deficits/detail RLE Deficits / Details: WFL for standing, noted slight knee buckling LLE Deficits / Details: WFL for standing    Cervical / Trunk Assessment Cervical / Trunk Assessment: Normal  Communication   Communication: No difficulties  Cognition Arousal/Alertness: Awake/alert Behavior During Therapy: WFL for tasks assessed/performed Overall Cognitive Status: Within Functional Limits for tasks assessed                                        General Comments      Exercises General Exercises - Upper Extremity Shoulder Flexion: AAROM;Right;Left;5 reps;Supine   Assessment/Plan    PT Assessment Patient needs continued PT services  PT Problem List Decreased strength;Decreased range of motion;Decreased activity tolerance;Decreased balance;Decreased mobility;Decreased knowledge of precautions;Decreased safety awareness;Decreased knowledge of use of DME;Pain       PT Treatment Interventions DME instruction;Gait training;Functional mobility training;Therapeutic activities;Therapeutic exercise;Patient/family education    PT Goals (Current goals can be found in the Care Plan section)  Acute Rehab PT Goals Patient Stated Goal: to get up and walk PT Goal Formulation: With patient/family Time For Goal Achievement: 05/07/17 Potential to Achieve Goals: Good    Frequency Min 3X/week   Barriers to discharge Inaccessible home environment      Co-evaluation               AM-PAC PT "6 Clicks" Daily Activity  Outcome Measure Difficulty turning over in bed (including adjusting bedclothes, sheets and blankets)?: Unable Difficulty moving from lying on back to sitting on the side of the bed? : Unable Difficulty sitting down on and standing up from a chair with arms (e.g., wheelchair, bedside commode, etc,.)?: Unable Help needed moving to and from a bed to chair (including a wheelchair)?: Total Help needed walking in hospital room?: Total Help needed climbing 3-5  steps with a railing? : Total 6 Click Score: 6    End of Session   Activity Tolerance: Patient tolerated treatment well Patient left: in bed;with call bell/phone within reach;with bed alarm set;with family/visitor present Nurse Communication: Mobility status PT Visit Diagnosis: Difficulty in walking, not elsewhere classified (R26.2)    Time: 5885-0277 PT Time Calculation (min) (ACUTE ONLY): 38 min   Charges:   PT Evaluation $PT Eval Moderate Complexity: 1 Mod PT Treatments $Therapeutic Activity: 23-37 mins   PT G CodesTresa Endo PT [phone]   Claretha Cooper 04/23/2017, 10:53 AM

## 2017-04-23 NOTE — Progress Notes (Signed)
PULMONARY / CRITICAL CARE MEDICINE   Name: Jamie Hicks MRN: [id number] DOB: November 20, 1955    ADMISSION DATE:  04/18/2017 CONSULTATION DATE:  11/6  REFERRING MD:  Dr. Dyann Kief  CHIEF COMPLAINT:  Resp distress BRIEF   61 year old female with PMH as below, which is significant for HTN, morbid obesity, and hypothyroidism. She presented to [facility] 11/5 with complaints of SOB, nonproductive cough, malaise, and muscle aches x 3 days. Upon arrival to the ED she was febrile. She underwent CT angio given concerns for PE. This was negative for PE, however, demonstrated LUL opacification thought to represent pneumonia. Rapid flu PCR positive for Flu A. She was admitted to SDU and the hospitalist team and was treated with IV antibiotics and tamiflu. Since that time she has become hypoxemic and dyspnea has continued to worsen. She was placed on BiPAP but remained hypoxemic. PCCM consulted.   At time of my exam patient is very somnolent with no response to sternal rub. RN reports patient had required a total of 2mg  of Ativan IV over the course of the evening as she had been agitated and not cooperating with the BIPAP but was hypoxic off the BIPAP. She is not currently protecting her airway well. She also remains hypercapneic even with the BIPAP in place. Therefore decision made to intubate. Patient's son and daughter at at bedside and consent to intubation.    STUDIES:  CTA chest 11/5 > No evidence of pulmonary embolus. Mild cardiomegaly. Mild left upper lobe infiltrate or atelectasis is noted.  Bilateral diffuse groundglass bilateral diffuse groundglass CXR 11/6 > progression of infiltrates to diffuse bilateral  ECHO 11/7 >> LV mild concentric hypertrophy, LVEF 35-40%, diffuse hypokinesis, LA severely dilated,   CULTURES: Blood 11/5 >> Urine 11/6 >> negative  Sputum 11/6 >> Influenza PCR 11/5 > Flu B neg, Flu A POS   SIGNIFICANT EVENTS: 11/5 admit for flu pneumonia 11/6 progression to  ARDS  LINES/TUBES: ETT 11/6 >>11.9 Foley catheter 11/6>> PIV's 11/5 >> R Radial Aline 11/7 >> 11/8     SUBJECTIVE/OVERNIGHT/INTERVAL HX 04/23/2017 0 extubated yesterday and being diuresed for Acute systolic CHF. CXr better. Fatigued. Eating. stil lhas foley     VITAL SIGNS: Vitals:   04/23/17 0500 04/23/17 0600 04/23/17 0700 04/23/17 0800  BP: 140/75 (!) 144/71 136/75 127/74  Pulse: 66 (!) 58 63 65  Resp: (!) 26 17 19  (!) 28  Temp: 99.7 F (37.6 C) 99.5 F (37.5 C) 99 F (37.2 C) 99.5 F (37.5 C)  TempSrc:    Core (Comment)  SpO2: 98% 97% 91% 96%  Weight:      Height:        VENTILATOR SETTINGS:    INTAKE / OUTPUT: I/O last 3 completed shifts: In: 1013.6 [P.O.:474; I.V.:69.6; NG/GT:420; IV Piggyback:50] Out: 3601 [Urine:3600; Stool:1]  PHYSICAL EXAMINATION:  General Appearance:    Looks better but deconditioned  Head:    Normocephalic, without obvious abnormality, atraumatic  Eyes:    PERRL - yes, conjunctiva/corneas - clea4      Ears:    Normal external ear canals, both ears  Nose:   NG tube - no  Throat:  ETT TUBE - no , OG tube - no  Neck:   Supple,  No enlargement/tenderness/nodules     Lungs:     Clear to auscultation bilaterally,  Chest wall:    No deformity  Heart:    S1 and S2 normal, no murmur, CVP - no.  Pressors - no  Abdomen:     Soft, no masses, no organomegaly  Genitalia:    Not done  Rectal:   not done  Extremities:   Extremities- no edema     Skin:   Intact in exposed areas .     Neurologic:   Sedation - none -> RASS - 0 . Moves all 4s - yes. CAM-ICU - neg for delirium  . Orientation - x3+       LABS: PULMONARY Recent Labs  Lab 04/19/17 1920 04/19/17 2100 04/20/17 0007 04/20/17 0255  PHART 7.123* 7.261* 7.265* 7.379  PCO2ART 66.5* 56.5* 60.0* 41.2  PO2ART 79.5* 86.5 184* 235*  HCO3 19.9* 23.9 25.7 23.4  O2SAT 84.6 92.6 99.0 99.7    CBC Recent Labs  Lab 04/21/17 0222 04/22/17 0353 04/23/17 0346  HGB 9.1* 9.8* 11.2*   HCT 28.0* 30.5* 34.7*  WBC 4.1 4.4 5.0  PLT 164 186 223    COAGULATION No results for input(s): INR in the last 168 hours.  CARDIAC   Recent Labs  Lab 04/23/17 0346  TROPONINI 0.06*   No results for input(s): PROBNP in the last 168 hours.   CHEMISTRY Recent Labs  Lab 04/19/17 2033 04/20/17 0402 04/21/17 0222 04/22/17 0353 04/23/17 0346  NA 133* 137 138 140 144  K 3.4* 3.3* 3.1* 3.4* 4.2  CL 99* 104 107 105 106  CO2 22 22 23 25 27   GLUCOSE 147* 108* 91 104* 100*  BUN 15 18 27* 27* 27*  CREATININE 1.15* 1.17* 1.21* 1.14* 1.07*  CALCIUM 8.3* 8.3* 8.5* 8.7* 9.5  MG  --   --  1.6* 1.9 2.1  PHOS  --   --  2.1*  --  3.6   Estimated Creatinine Clearance: 72.3 mL/min (A) (by C-G formula based on SCr of 1.07 mg/dL (H)).   LIVER Recent Labs  Lab 04/18/17 0950 04/19/17 0451 04/19/17 2033  AST 21 23 94*  ALT 17 16 46  ALKPHOS 76 63 89  BILITOT 0.7 0.6 0.5  PROT 8.2* 7.1 7.8  ALBUMIN 4.2 3.6 3.6     INFECTIOUS Recent Labs  Lab 04/19/17 2033 04/19/17 2332  04/20/17 0402 04/21/17 0222 04/22/17 1103  LATICACIDVEN 1.8 1.8  --   --   --   --   PROCALCITON  --   --    < > 17.71 16.86 5.94   < > = values in this interval not displayed.     ENDOCRINE CBG (last 3)  Recent Labs    04/22/17 0800 04/22/17 1150 04/23/17 0738  GLUCAP 104* 90 107*         IMAGING x48h  - image(s) personally visualized  -   highlighted in bold Dg Chest Port 1 View  Result Date: 04/23/2017 CLINICAL DATA:  Acute respiratory failure with hypoxia EXAM: PORTABLE CHEST 1 VIEW COMPARISON:  04/22/2017 FINDINGS: Endotracheal tube and NG tube have been removed since the prior study. Marked cardiac enlargement unchanged. Pulmonary vascular congestion with mild edema has improved. Bibasilar atelectasis has improved. No effusion. IMPRESSION: Endotracheal tube and NG tube removed. Improvement in heart failure and bibasilar atelectasis since the prior study. Electronically Signed   By:  Franchot Gallo M.D.   On: 04/23/2017 06:41   Dg Chest Port 1 View  Result Date: 04/22/2017 CLINICAL DATA:  Respiratory failure.  Endotracheal tube EXAM: PORTABLE CHEST 1 VIEW COMPARISON:  Three days ago FINDINGS: Endotracheal tube tip at the clavicular heads. An orogastric tube reaches the stomach. Cardiopericardial enlargement that is stable.  Worsening aeration at the right base where the diaphragm is less well defined. Bilateral streaky perihilar opacity. IMPRESSION: 1. Stable positioning of endotracheal and orogastric tubes. 2. Influenza with perihilar atelectasis and bronchopneumonia. Mild worsening aeration at the right base. 3. Cardiomegaly. Electronically Signed   By: Monte Fantasia M.D.   On: 04/22/2017 06:37       ASSESSMENT / PLAN: 61 year old female with hx HTN, morbid obesity, and hypothyroidism, presented 11/5 with SOB, nonproductive cough, malaise, fever, Influenza A.  Probable left upper lobe pneumonia.  Evolving infiltrates and respiratory failure, ARDS.  Intubated 11/6 p.m.  PULMONARY Acute hypoxemic/hypercarbic respiratory failure; influenza A pneumonia;  Possible secondary bacterial pneumonia based on PCT but culture negative Definite  component pulmonary edema wit acute systolic CHF  67/67/2094 - remains extubated x 24h. CX rimproved    P: Pulmonary hygiene post extubation - IS, mobilize  PRN albuterol  Continue antibiotics as below Tamiflu   CARDIOVASCULAR   Recent Labs  Lab 04/23/17 0346  TROPONINI 0.06*     Acute CHF - LVEF 35-40%, ? Viral illness + underlying long term hypertensive physiology  Hypertensive urgency with history of hypertension   - improving with diuresis P:  tele  need Cardiology input ; prior to dc after diuresis comolete - TRH to organize timing   RENAL Mild AKI   04/23/2017 - resolved  P: Trend BMP / urinary output Avoid nephrotoxic agents, ensure adequate renal perfusion  GASTROINTESTINAL Nutrition  P: PPI for SUP  > transition to h2 blockade 11/10 (home med) NPO x4 hours, then can advance to heart healthy diet as tolerated    HEMATOLOGIC Recent Labs  Lab 04/21/17 0222 04/22/17 0353 04/23/17 0346  HGB 9.1* 9.8* 11.2*  HCT 28.0* 30.5* 34.7*  WBC 4.1 4.4 5.0  PLT 164 186 223    Anemia P: Follow CBC  Heparin for DVT prophylaxis  - PRBC for hgb </= 6.9gm%    - exceptions are   -  if ACS susepcted/confirmed then transfuse for hgb </= 8.0gm%,  or    -  active bleeding with hemodynamic instability, then transfuse regardless of hemoglobin value   At at all times try to transfuse 1 unit prbc as possible with exception of active hemorrhage    INFECTIOUS Results for orders placed or performed during the hospital encounter of 04/18/17  Blood culture (routine x 2)     Status: None (Preliminary result)   Collection Time: 04/18/17 10:14 AM  Result Value Ref Range Status   Specimen Description BLOOD BLOOD LEFT FOREARM  Final   Special Requests   Final    BOTTLES DRAWN AEROBIC AND ANAEROBIC Blood Culture adequate volume   Culture   Final    NO GROWTH 4 DAYS Performed at [facility], [place] [address], [place], [place] [address]    Report Status PENDING  Incomplete  Blood culture (routine x 2)     Status: None (Preliminary result)   Collection Time: 04/18/17 10:14 AM  Result Value Ref Range Status   Specimen Description BLOOD RIGHT HAND  Final   Special Requests   Final    BOTTLES DRAWN AEROBIC AND ANAEROBIC Blood Culture adequate volume   Culture   Final    NO GROWTH 4 DAYS Performed at [facility], [place] [address] [place], [place] [address]    Report Status PENDING  Incomplete  Urine culture     Status: None   Collection Time: 04/19/17  7:40 PM  Result Value Ref Range Status   Specimen Description URINE, RANDOM  Final   Special Requests NONE  Final   Culture   Final    NO GROWTH Performed at [facility], [address] [place], [place] [address]    Report Status  04/21/2017 FINAL  Final  MRSA PCR Screening     Status: None   Collection Time: 04/19/17  7:58 PM  Result Value Ref Range Status   MRSA by PCR NEGATIVE NEGATIVE Final    Comment:        The GeneXpert MRSA Assay (FDA approved for NASAL specimens only), is one component of a comprehensive MRSA colonization surveillance program. It is not intended to diagnose MRSA infection nor to guide or monitor treatment for MRSA infections.   Culture, blood (routine x 2)     Status: None (Preliminary result)   Collection Time: 04/19/17  8:32 PM  Result Value Ref Range Status   Specimen Description BLOOD RIGHT ARM  Final   Special Requests   Final    BOTTLES DRAWN AEROBIC ONLY Blood Culture adequate volume   Culture   Final    NO GROWTH 3 DAYS Performed at [facility], [address], [place], [place] [address]    Report Status PENDING  Incomplete  Culture, blood (routine x 2)     Status: None (Preliminary result)   Collection Time: 04/19/17  8:37 PM  Result Value Ref Range Status   Specimen Description BLOOD RIGHT ARM  Final   Special Requests   Final    BOTTLES DRAWN AEROBIC ONLY Blood Culture adequate volume   Culture   Final    NO GROWTH 3 DAYS Performed at [facility], [address] [place], [place] [address]    Report Status PENDING  Incomplete  Culture, respiratory (NON-Expectorated)     Status: None   Collection Time: 04/20/17 12:38 AM  Result Value Ref Range Status   Specimen Description TRACHEAL ASPIRATE  Final   Special Requests NONE  Final   Gram Stain NO WBC SEEN NO ORGANISMS SEEN   Final   Culture   Final    Consistent with normal respiratory flora. Performed at [facility], [place] [address] [place], [place] [address]    Report Status 04/22/2017 FINAL  Final   Recent Labs  Lab 04/19/17 2357 04/20/17 0402 04/21/17 0222 04/22/17 1103  PROCALCITON 10.70 17.71 16.86 5.94     Severe sepsis Flu A pneumonia Possible superimposed left upper  lobe bacterial pneumonia   P: Continue Tamiflu to stop date ABX as above -total 7 days - TRH to stop at 7days Follow cultures  Droplet precautions   ENDOCRINE Hx Hypothyroidism P: Continue synthroid     NEUROLOGIC Acute encephalopathy, resolved    - resolve P: PT consult for post extubation  Mobilize / OOB     FAMILY  - Family: patient updated 04/23/2017   - Inter-disciplinary family meet or Palliative Care meeting due by: 04/26/17   OVERALL Dc foley Move to tele TRH paged for take over 04/24/17  Cards consult needed prior to dc     Dr. Brand Males, M.D., [facility].C.P Pulmonary and Critical Care Medicine Staff Physician [facility] Pager: [phone], If no answer or between  15:00h - 7:00h: call [phone]  04/23/2017 9:02 AM

## 2017-04-24 ENCOUNTER — Inpatient Hospital Stay (HOSPITAL_COMMUNITY): Payer: 59

## 2017-04-24 LAB — CULTURE, BLOOD (ROUTINE X 2)
CULTURE: NO GROWTH
Culture: NO GROWTH
Special Requests: ADEQUATE
Special Requests: ADEQUATE

## 2017-04-24 LAB — BASIC METABOLIC PANEL
Anion gap: 14 (ref 5–15)
BUN: 28 mg/dL — AB (ref 6–20)
CALCIUM: 10.1 mg/dL (ref 8.9–10.3)
CHLORIDE: 99 mmol/L — AB (ref 101–111)
CO2: 26 mmol/L (ref 22–32)
CREATININE: 1.18 mg/dL — AB (ref 0.44–1.00)
GFR calc Af Amer: 57 mL/min — ABNORMAL LOW (ref >60)
GFR calc non Af Amer: 49 mL/min — ABNORMAL LOW (ref >60)
Glucose, Bld: 133 mg/dL — ABNORMAL HIGH (ref 65–99)
Potassium: 4.5 mmol/L (ref 3.5–5.1)
Sodium: 139 mmol/L (ref 135–145)

## 2017-04-24 LAB — MAGNESIUM: Magnesium: 2.2 mg/dL (ref 1.7–2.4)

## 2017-04-24 LAB — PHOSPHORUS: PHOSPHORUS: 3.6 mg/dL (ref 2.5–4.6)

## 2017-04-24 LAB — TROPONIN I: Troponin I: 0.04 ng/mL (ref <0.03)

## 2017-04-24 MED ORDER — CARVEDILOL 3.125 MG PO TABS
3.1250 mg | ORAL_TABLET | Freq: Two times a day (BID) | ORAL | Status: DC
  Administered 2017-04-24 – 2017-04-25 (×2): 3.125 mg via ORAL
  Filled 2017-04-24 (×2): qty 1

## 2017-04-24 MED ORDER — LISINOPRIL 5 MG PO TABS
2.5000 mg | ORAL_TABLET | Freq: Every day | ORAL | Status: DC
  Administered 2017-04-24 – 2017-04-25 (×2): 2.5 mg via ORAL
  Filled 2017-04-24: qty 1

## 2017-04-24 NOTE — Clinical Social Work Note (Signed)
Clinical Social Work Assessment  Patient Details  Name: Jamie Hicks MRN: [id number] Date of Birth: 02/23/1956  Date of referral:  04/24/17               Reason for consult:  Facility Placement                Permission sought to share information with:  Facility Sport and exercise psychologist, Family Supports Permission granted to share information::  Yes, Verbal Permission Granted  Name::     Jamie Hicks  Agency::  [facility]NF[facility]  Relationship::  daughter  Contact Information:  [phone]  Housing/Transportation Living arrangements for the past 2 months:  Single Family Home Source of Information:  Patient Patient Interpreter Needed:  None Criminal Activity/Legal Involvement Pertinent to Current Situation/Hospitalization:  No - Comment as needed Significant Relationships:  Adult Children, Other Family Members Lives with:  Adult Children, Other (Comment)(other family members) Do you feel safe going back to the place where you live?  Yes Need for family participation in patient care:  No (Coment)  Care giving concerns: Patient from home with adult daughter and other family members. PT recommending SNF.   Social Worker assessment / plan: CSW met with patient at bedside, no family members present. Patient understanding of SNF recommendation and agreeable to ST SNF placement. Patient hopeful for mobility recovery so she can return home. CSW sent out initial referrals and will follow for bed choice.  Employment status:  Retired Research officer, political party) PT Recommendations:  [place] / Referral to community resources:  [place]  Patient/Family's Response to care: Patient appreciative of care.  Patient/Family's Understanding of and Emotional Response to Diagnosis, Current Treatment, and Prognosis: Patient with understanding of her medical conditions and SNF recommendation.  Emotional Assessment Appearance:  Appears stated  age Attitude/Demeanor/Rapport:  Other(appropriate) Affect (typically observed):  Calm, Pleasant Orientation:  Oriented to Self, Oriented to Place, Oriented to  Time, Oriented to Situation Alcohol / Substance use:  Not Applicable Psych involvement (Current and /or in the community):  No (Comment)  Discharge Needs  Concerns to be addressed:  Discharge Planning Concerns Readmission within the last 30 days:  No Current discharge risk:  Physical Impairment Barriers to Discharge:  Continued Medical Work up   Estanislado Emms, LCSW 04/24/2017, 2:24 PM

## 2017-04-24 NOTE — Progress Notes (Signed)
@IPLOG @        PROGRESS NOTE                                                                                                                                                                                                             Patient Demographics:    Jamie Hicks, is a 61 y.o. female, DOB - 03/26/1956, PFX:[id number]  Admit date - 04/18/2017   Admitting Physician Barton Dubois, MD  Outpatient Primary MD for the patient is Patient, No Pcp Per  LOS - 6  Chief Complaint  Patient presents with  . Shortness of Breath       Brief Narrative   61 year old female with PMH as below, which is significant for HTN, morbid obesity, and hypothyroidism. She presented to [place] 11/5 with complaints of SOB, nonproductive cough, malaise, and muscle aches x 3 days. Upon arrival to the ED she was febrile. She underwent CT angio given concerns for PE. This was negative for PE, however, demonstrated LUL opacification thought to represent pneumonia. Rapid flu PCR positive for Flu A. She was admitted to SDU and the hospitalist team and was treated with IV antibiotics and tamiflu.  However she clinically deteriorated and required endotracheal intubation and admission to ICU under pulmonary critical care, she was also found to have systolic CHF, she was stabilized and transferred to hospitalist service again on 04/24/2017.    Subjective:    Jamie Hicks today has, No headache, No chest pain, No abdominal pain - No Nausea, No new weakness tingling or numbness, No Cough - SOB.     Assessment  & Plan :     1.  Acute hypoxic and hypercarbic respiratory failure due to combination of influenza A pneumonia, possible superimposed bacterial pneumonia along with acute on chronic systolic CHF along with possible ARDS.  Required intubation, successfully extubated, completing Tamiflu and antibiotic course, clinically appears nontoxic and infection seems to be resolving, has been adequately  diuresed and now lungs are clear, discontinue Lasix on 04/24/2017 evening and monitor as needed.  Advance activity.  2.  Acute on chronic systolic CHF.  EF 35-40% with diffuse hypokinesis.  Clinically close to being compensated, IV Lasix discontinued on 04/24/2017 and will reevaluate tomorrow, placed on Coreg along with low-dose ACE inhibitor, will likely require further workup under the guidance of cardiology preferably outpatient.  3.  Hypothyroidism.  Continue Synthroid.  4.  Smoking.  Counseled to quit on NicoDerm patch.  5.  Hypertension.  Placed on Coreg and ACE inhibitor will  monitor, if needed add hydralazine.  6.  Underlying COPD.  No acute issues.  No wheezing, supportive care as needed.     Diet : Diet Heart Room service appropriate? Yes; Fluid consistency: Thin    Family Communication  :  Son  Code Status :  Full  Disposition Plan  :  SNF  Consults  :  Cards, Pulm  Procedures  :    CTA chest 11/5 > No evidence of pulmonary embolus. Mild cardiomegaly. Mild left upper lobe infiltrate or atelectasis is noted.  Bilateral diffuse groundglass bilateral diffuse groundglass.  CXR 11/6 > progression of infiltrates to diffuse bilateral .  ECHO 11/7 >> LV mild concentric hypertrophy, LVEF 35-40%, diffuse hypokinesis, LA severely dilated,   ETT 11/6 >>11.9  Foley catheter 11/6>> 11/11  PIV's 11/5 >>  R Radial Aline 11/7 >> 11/8       DVT Prophylaxis  :   Heparin    Lab Results  Component Value Date   PLT 223 04/23/2017    Inpatient Medications  Scheduled Meds: . dextromethorphan-guaiFENesin  1 tablet Oral BID  . famotidine  20 mg Oral QHS  . furosemide  40 mg Intravenous Q8H  . heparin  5,000 Units Subcutaneous Q8H  . ipratropium-albuterol  3 mL Nebulization TID  . levothyroxine  75 mcg Oral QAC breakfast  . mouth rinse  15 mL Mouth Rinse BID  . multivitamin with minerals  1 tablet Oral Daily  . nicotine  14 mg Transdermal Daily  . potassium chloride   40 mEq Oral BID  . sodium chloride flush  3 mL Intravenous Q12H   Continuous Infusions: . sodium chloride    . ceFEPime (MAXIPIME) IV Stopped (04/24/17 2703)   PRN Meds:.sodium chloride, albuterol, diphenhydrAMINE, guaiFENesin-dextromethorphan, hydrALAZINE, labetalol, phenol, sodium chloride flush  Antibiotics  :    Anti-infectives (From admission, onward)   Start     Dose/Rate Route Frequency Ordered Stop   04/22/17 2200  oseltamivir (TAMIFLU) capsule 75 mg  Status:  Discontinued     75 mg Per NG tube 2 times daily 04/22/17 1109 04/22/17 2040   04/22/17 2200  oseltamivir (TAMIFLU) capsule 75 mg     75 mg Oral 2 times daily 04/22/17 2040 04/23/17 2132   04/20/17 2200  vancomycin (VANCOCIN) 1,250 mg in sodium chloride 0.9 % 250 mL IVPB  Status:  Discontinued     1,250 mg 166.7 mL/hr over 90 Minutes Intravenous Every 24 hours 04/19/17 2130 04/22/17 1053   04/20/17 1000  oseltamivir (TAMIFLU) capsule 150 mg  Status:  Discontinued     150 mg Per NG tube 2 times daily 04/19/17 2304 04/22/17 1109   04/19/17 2200  ceFEPIme (MAXIPIME) 1 g in dextrose 5 % 50 mL IVPB     1 g 100 mL/hr over 30 Minutes Intravenous Every 8 hours 04/19/17 2126     04/19/17 2100  vancomycin (VANCOCIN) 2,000 mg in sodium chloride 0.9 % 500 mL IVPB     2,000 mg 250 mL/hr over 120 Minutes Intravenous STAT 04/19/17 2048 04/20/17 0118   04/19/17 1000  cefTRIAXone (ROCEPHIN) 1 g in dextrose 5 % 50 mL IVPB  Status:  Discontinued     1 g 100 mL/hr over 30 Minutes Intravenous Every 24 hours 04/18/17 2032 04/19/17 2023   04/19/17 1000  azithromycin (ZITHROMAX) 500 mg in dextrose 5 % 250 mL IVPB  Status:  Discontinued     500 mg 250 mL/hr over 60 Minutes Intravenous Every 24 hours 04/18/17 2032  04/19/17 2023   04/18/17 2200  oseltamivir (TAMIFLU) capsule 75 mg  Status:  Discontinued     75 mg Oral 2 times daily 04/18/17 1651 04/19/17 2304   04/18/17 1315  oseltamivir (TAMIFLU) capsule 75 mg     75 mg Oral  Once 04/18/17  1310 04/18/17 1346   04/18/17 1015  cefTRIAXone (ROCEPHIN) 1 g in dextrose 5 % 50 mL IVPB     1 g 100 mL/hr over 30 Minutes Intravenous  Once 04/18/17 1005 04/18/17 1104   04/18/17 1015  azithromycin (ZITHROMAX) 500 mg in dextrose 5 % 250 mL IVPB     500 mg 250 mL/hr over 60 Minutes Intravenous  Once 04/18/17 1005 04/18/17 1118         Objective:   Vitals:   04/23/17 1913 04/23/17 2001 04/24/17 0517 04/24/17 0738  BP: (!) 147/78  (!) 143/83   Pulse: 68  65   Resp: (!) 22  20   Temp: 98.2 F (36.8 C)  97.9 F (36.6 C)   TempSrc: Oral  Oral   SpO2: 100% 100% 100% 95%  Weight:      Height:        Wt Readings from Last 3 Encounters:  04/23/17 102.3 kg (225 lb 8.5 oz)  08/29/16 112.9 kg (249 lb)     Intake/Output Summary (Last 24 hours) at 04/24/2017 1251 Last data filed at 04/24/2017 0600 Gross per 24 hour  Intake 320 ml  Output 300 ml  Net 20 ml     Physical Exam  Awake Alert, Oriented X 3, No new F.N deficits, Normal affect Roberta.AT,PERRAL Supple Neck,No JVD, No cervical lymphadenopathy appriciated.  Symmetrical Chest wall movement, Good air movement bilaterally, CTAB RRR,No Gallops,Rubs or new Murmurs, No Parasternal Heave +ve B.Sounds, Abd Soft, No tenderness, No organomegaly appriciated, No rebound - guarding or rigidity. No Cyanosis, Clubbing or edema, No new Rash or bruise      Data Review:    CBC Recent Labs  Lab 04/18/17 0950  04/19/17 2033 04/20/17 0402 04/21/17 0222 04/22/17 0353 04/23/17 0346  WBC 9.2   < > 9.7 6.1 4.1 4.4 5.0  HGB 10.9*   < > 10.6* 10.1* 9.1* 9.8* 11.2*  HCT 34.0*   < > 33.1* 31.8* 28.0* 30.5* 34.7*  PLT 178   < > 200 154 164 186 223  MCV 84.2   < > 84.4 83.9 82.1 82.0 83.6  MCH 27.0   < > 27.0 26.6 26.7 26.3 27.0  MCHC 32.1   < > 32.0 31.8 32.5 32.1 32.3  RDW 15.4   < > 15.4 15.7* 15.6* 16.0* 16.2*  LYMPHSABS 0.4*  --  0.4*  --   --   --   --   MONOABS 0.2  --  0.3  --   --   --   --   EOSABS 0.0  --  0.0  --   --   --    --   BASOSABS 0.0  --  0.0  --   --   --   --    < > = values in this interval not displayed.    Chemistries  Recent Labs  Lab 04/18/17 0950 04/19/17 0451 04/19/17 2033 04/20/17 0402 04/21/17 0222 04/22/17 0353 04/23/17 0346 04/24/17 0506 04/24/17 0933  NA 136 137 133* 137 138 140 144  --  139  K 3.9 3.6 3.4* 3.3* 3.1* 3.4* 4.2  --  4.5  CL 101 104 99* 104 107 105  106  --  99*  CO2 25 25 22 22 23 25 27   --  26  GLUCOSE 104* 98 147* 108* 91 104* 100*  --  133*  BUN 17 14 15 18  27* 27* 27*  --  28*  CREATININE 0.97 0.91 1.15* 1.17* 1.21* 1.14* 1.07*  --  1.18*  CALCIUM 8.9 8.5* 8.3* 8.3* 8.5* 8.7* 9.5  --  10.1  MG  --   --   --   --  1.6* 1.9 2.1 2.2  --   AST 21 23 94*  --   --   --   --   --   --   ALT 17 16 46  --   --   --   --   --   --   ALKPHOS 76 63 89  --   --   --   --   --   --   BILITOT 0.7 0.6 0.5  --   --   --   --   --   --    ------------------------------------------------------------------------------------------------------------------ No results for input(s): CHOL, HDL, LDLCALC, TRIG, CHOLHDL, LDLDIRECT in the last 72 hours.  Lab Results  Component Value Date   HGBA1C 5.9 (H) 09/22/2012   ------------------------------------------------------------------------------------------------------------------ No results for input(s): TSH, T4TOTAL, T3FREE, THYROIDAB in the last 72 hours.  Invalid input(s): FREET3 ------------------------------------------------------------------------------------------------------------------ No results for input(s): VITAMINB12, FOLATE, FERRITIN, TIBC, IRON, RETICCTPCT in the last 72 hours.  Coagulation profile No results for input(s): INR, PROTIME in the last 168 hours.  No results for input(s): DDIMER in the last 72 hours.  Cardiac Enzymes Recent Labs  Lab 04/23/17 0346 04/24/17 0506  TROPONINI 0.06* 0.04*    ------------------------------------------------------------------------------------------------------------------    Component Value Date/Time   BNP 379.4 (H) 04/19/2017 2357    Micro Results Recent Results (from the past 240 hour(s))  Blood culture (routine x 2)     Status: None   Collection Time: 04/18/17 10:14 AM  Result Value Ref Range Status   Specimen Description BLOOD BLOOD LEFT FOREARM  Final   Special Requests   Final    BOTTLES DRAWN AEROBIC AND ANAEROBIC Blood Culture adequate volume   Culture   Final    NO GROWTH 5 DAYS Performed at [facility], [address] [place], [place] [address]    Report Status 04/23/2017 FINAL  Final  Blood culture (routine x 2)     Status: None   Collection Time: 04/18/17 10:14 AM  Result Value Ref Range Status   Specimen Description BLOOD RIGHT HAND  Final   Special Requests   Final    BOTTLES DRAWN AEROBIC AND ANAEROBIC Blood Culture adequate volume   Culture   Final    NO GROWTH 5 DAYS Performed at [facility], [place] [address] [place], [place] [address]    Report Status 04/23/2017 FINAL  Final  Urine culture     Status: None   Collection Time: 04/19/17  7:40 PM  Result Value Ref Range Status   Specimen Description URINE, RANDOM  Final   Special Requests NONE  Final   Culture   Final    NO GROWTH Performed at [facility], [place] [address], [place],  [address]    Report Status 04/21/2017 FINAL  Final  MRSA PCR Screening     Status: None   Collection Time: 04/19/17  7:58 PM  Result Value Ref Range Status   MRSA by PCR NEGATIVE NEGATIVE Final  Comment:        The GeneXpert MRSA Assay (FDA approved for NASAL specimens only), is one component of a comprehensive MRSA colonization surveillance program. It is not intended to diagnose MRSA infection nor to guide or monitor treatment for MRSA infections.   Culture, blood (routine x 2)     Status: None   Collection Time: 04/19/17  8:32 PM  Result  Value Ref Range Status   Specimen Description BLOOD RIGHT ARM  Final   Special Requests   Final    BOTTLES DRAWN AEROBIC ONLY Blood Culture adequate volume   Culture   Final    NO GROWTH 5 DAYS Performed at [facility], [address], [place], [place] [address]    Report Status 04/24/2017 FINAL  Final  Culture, blood (routine x 2)     Status: None   Collection Time: 04/19/17  8:37 PM  Result Value Ref Range Status   Specimen Description BLOOD RIGHT ARM  Final   Special Requests   Final    BOTTLES DRAWN AEROBIC ONLY Blood Culture adequate volume   Culture   Final    NO GROWTH 5 DAYS Performed at [facility], [place] [address] [place], [place] [address]    Report Status 04/24/2017 FINAL  Final  Culture, respiratory (NON-Expectorated)     Status: None   Collection Time: 04/20/17 12:38 AM  Result Value Ref Range Status   Specimen Description TRACHEAL ASPIRATE  Final   Special Requests NONE  Final   Gram Stain NO WBC SEEN NO ORGANISMS SEEN   Final   Culture   Final    Consistent with normal respiratory flora. Performed at [facility], [place] [address] [place], [place] [address]    Report Status 04/22/2017 FINAL  Final    Radiology Reports Dg Chest 2 View  Result Date: 04/18/2017 CLINICAL DATA:  61 year old female with shortness breath, dry cough and body aches with fever for the past week. Hypertension, smoking and thyroid disease. Initial encounter. EXAM: CHEST  2 VIEW COMPARISON:  05/25/2016 chest x-ray. FINDINGS: Cardiomegaly. Pulmonary vascular congestion. No segmental consolidation. No pneumothorax. Exam limited by habitus and technique however, no plain film evidence pulmonary malignancy. Calcified mildly tortuous aorta. Bilateral shoulder joint degenerative changes. Cannot exclude humeral head avascular necrosis. Mild degenerative changes thoracic spine without focal compression fracture. IMPRESSION: Cardiomegaly. Pulmonary vascular congestion. No segmental  consolidation. Aortic Atherosclerosis (ICD10-I70.0). Calcified mildly tortuous aorta. Bilateral shoulder joint degenerative changes. Cannot exclude humeral head avascular necrosis. Electronically Signed   By: Genia Del M.D.   On: 04/18/2017 09:39   Dg Abd 1 View  Result Date: 04/19/2017 CLINICAL DATA:  OG tube placement EXAM: ABDOMEN - 1 VIEW COMPARISON:  05/15/2009, 04/19/2017 FINDINGS: Cardiomegaly with left lower lobe opacity. Esophageal tube tip projects over the right abdomen, likely the proximal duodenum IMPRESSION: Esophageal tube tip visible in the right mid abdomen, probably within the duodenum. Electronically Signed   By: Donavan Foil M.D.   On: 04/19/2017 23:59   Ct Angio Chest Pe W And/or Wo Contrast  Result Date: 04/18/2017 CLINICAL DATA:  Shortness of breath. EXAM: CT ANGIOGRAPHY CHEST WITH CONTRAST TECHNIQUE: Multidetector CT imaging of the chest was performed using the standard protocol during bolus administration of intravenous contrast. Multiplanar CT image reconstructions and MIPs were obtained to evaluate the vascular anatomy. CONTRAST:  100 mL of Isovue 370 intravenously. COMPARISON:  None. FINDINGS: Cardiovascular: Satisfactory opacification of the pulmonary arteries to the segmental level.  No evidence of pulmonary embolism. Mild cardiomegaly is noted. No pericardial effusion. Mediastinum/Nodes: No enlarged mediastinal, hilar, or axillary lymph nodes. Thyroid gland, trachea, and esophagus demonstrate no significant findings. Lungs/Pleura: No pneumothorax or pleural effusion is noted. Mild left upper lobe infiltrate or atelectasis is noted. Upper Abdomen: No acute abnormality. Musculoskeletal: No chest wall abnormality. No acute or significant osseous findings. Review of the MIP images confirms the above findings. IMPRESSION: No evidence of pulmonary embolus. Mild cardiomegaly. Mild left upper lobe infiltrate or atelectasis is noted. Electronically Signed   By: Marijo Conception, M.D.    On: 04/18/2017 12:02   Dg Chest Port 1 View  Result Date: 04/23/2017 CLINICAL DATA:  Acute respiratory failure with hypoxia EXAM: PORTABLE CHEST 1 VIEW COMPARISON:  04/22/2017 FINDINGS: Endotracheal tube and NG tube have been removed since the prior study. Marked cardiac enlargement unchanged. Pulmonary vascular congestion with mild edema has improved. Bibasilar atelectasis has improved. No effusion. IMPRESSION: Endotracheal tube and NG tube removed. Improvement in heart failure and bibasilar atelectasis since the prior study. Electronically Signed   By: Franchot Gallo M.D.   On: 04/23/2017 06:41   Dg Chest Port 1 View  Result Date: 04/22/2017 CLINICAL DATA:  Respiratory failure.  Endotracheal tube EXAM: PORTABLE CHEST 1 VIEW COMPARISON:  Three days ago FINDINGS: Endotracheal tube tip at the clavicular heads. An orogastric tube reaches the stomach. Cardiopericardial enlargement that is stable. Worsening aeration at the right base where the diaphragm is less well defined. Bilateral streaky perihilar opacity. IMPRESSION: 1. Stable positioning of endotracheal and orogastric tubes. 2. Influenza with perihilar atelectasis and bronchopneumonia. Mild worsening aeration at the right base. 3. Cardiomegaly. Electronically Signed   By: Monte Fantasia M.D.   On: 04/22/2017 06:37   Dg Chest Port 1 View  Result Date: 04/20/2017 CLINICAL DATA:  Intubated EXAM: PORTABLE CHEST 1 VIEW COMPARISON:  04/19/2017 FINDINGS: Endotracheal tube tip is about 4 cm superior to carina. Esophageal tube tip is below the diaphragm. Cardiomegaly with diffuse bilateral interstitial and alveolar opacity consistent with edema, slightly decreased. Small left effusion. Dense left lower lobe consolidation. IMPRESSION: 1. Endotracheal tube tip about 4 cm superior to carina 2. Decreased interstitial and alveolar infiltrates or edema 3. Cardiomegaly. Dense left lower lobe consolidation and small left effusion. Electronically Signed   By: Donavan Foil M.D.   On: 04/20/2017 00:00   Dg Chest Port 1 View  Result Date: 04/19/2017 CLINICAL DATA:  Altered mental status an high fever. EXAM: PORTABLE CHEST 1 VIEW COMPARISON:  CT of the chest 04/18/2017 FINDINGS: The cardiac silhouette is enlarged. There is no evidence of pleural effusion or pneumothorax. Bilateral widespread interstitial and alveolar airspace opacities with central predominance. Osseous structures are without acute abnormality. Soft tissues are grossly normal. IMPRESSION: Interval development of bilateral widespread interstitial and alveolar opacities with central predominance. This may represent a mixed pattern rapidly evolving pulmonary edema or multifocal airspace consolidation. Electronically Signed   By: Fidela Salisbury M.D.   On: 04/19/2017 20:18   Dg Abd Portable 1v  Result Date: 04/20/2017 CLINICAL DATA:  OG tube placement EXAM: PORTABLE ABDOMEN - 1 VIEW COMPARISON:  04/19/2017 FINDINGS: Cardiomegaly. Consolidation at the left lung base. Esophageal tube tip overlies the mid abdomen. Nonobstructed gas pattern. IMPRESSION: Esophageal tube tip projects over the mid abdomen, possibly distal stomach. Contrast injection could be obtained to confirm intragastric location. Electronically Signed   By: Donavan Foil M.D.   On: 04/20/2017 22:20    Time Spent in minutes  [place] M.D on 04/24/2017 at 12:51 PM  Between 7am to 7pm - Pager - [phone] ( page via [email or web address], text pages only, please mention full 10 digit call back number). After 7pm go to [email or web address] - password [facility]

## 2017-04-24 NOTE — NC FL2 (Signed)
[place] LEVEL OF CARE SCREENING TOOL     IDENTIFICATION  Patient Name: Jamie Hicks Birthdate: [birth year] Sex: female Admission Date (Current Location): 04/18/2017  [facility] and [place] Number:  Herbalist and Address:  [facility] [place], [place] [address], [place], [place] [address]      Provider Number: [id number]  Attending Physician Name and Address:  Thurnell Lose, MD  Relative Name and Phone Number:  [name], daughter, [phone]    Current Level of Care: Hospital Recommended Level of Care: [place] Prior Approval Number:    Date Approved/Denied:   PASRR Number: [id number]  Discharge Plan: SNF    Current Diagnoses: Patient Active Problem List   Diagnosis Date Noted  . Acute respiratory failure with hypoxia ([place])   . ARDS (adult respiratory distress syndrome) ([place])   . Acute respiratory distress   . Community acquired pneumonia of left upper lobe of lung ([place])   . Hypoxemia   . Influenza A 04/18/2017  . Left upper lobe pneumonia ([place]) 04/18/2017  . Obesity, Class III, BMI 40-49.9 (morbid obesity) ([place]) 04/18/2017  . Thyroid activity decreased   . Cocaine dependence with cocaine-induced mood disorder ([place]) 07/22/2014  . Major depressive disorder, recurrent, severe without psychotic features ([place])   . Substance induced mood disorder ([place]) 07/19/2014  . Suicidal ideation   . Homicidal ideation   . Osteoarthritis 09/24/2012  . Prediabetes 09/24/2012  . Dyslipidemia 09/24/2012  . Vitamin D insufficiency 09/24/2012  . Hypothyroidism 09/22/2012  . Bilateral chronic knee pain 09/22/2012  . Smoker 09/22/2012  . Essential hypertension 09/22/2012    Orientation RESPIRATION BLADDER Height & Weight     Self, Time, Situation, Place  O2(nasal cannula 2L) Continent Weight: 225 lb 8.5 oz (102.3 kg) Height:  5\' 9"  (175.3 cm)  BEHAVIORAL SYMPTOMS/MOOD NEUROLOGICAL BOWEL NUTRITION STATUS   Incontinent Diet(please see DC summary)  AMBULATORY STATUS COMMUNICATION OF NEEDS Skin   Extensive Assist Verbally Normal                       Personal Care Assistance Level of Assistance  Bathing, Feeding, Dressing Bathing Assistance: Limited assistance Feeding assistance: Independent Dressing Assistance: Limited assistance     Functional Limitations Info  Sight, Hearing, Speech Sight Info: Adequate Hearing Info: Adequate Speech Info: Adequate    SPECIAL CARE FACTORS FREQUENCY  PT (By licensed PT)     PT Frequency: 5x/week              Contractures Contractures Info: Not present    Additional Factors Info  Code Status, Allergies Code Status Info: Full Allergies Info: Amoxicillin, Latex           Current Medications (04/24/2017):  This is the current hospital active medication list Current Facility-Administered Medications  Medication Dose Route Frequency Provider Last Rate Last Dose  . 0.9 %  sodium chloride infusion  250 mL Intravenous PRN Barton Dubois, MD      . albuterol (PROVENTIL) (2.5 MG/3ML) 0.083% nebulizer solution 2.5 mg  2.5 mg Nebulization Q4H PRN Collene Gobble, MD      . carvedilol (COREG) tablet 3.125 mg  3.125 mg Oral BID WC Thurnell Lose, MD      . ceFEPIme (MAXIPIME) 1 g in dextrose 5 % 50 mL IVPB  1 g Intravenous Q8H Randa Spike, RPH   Stopped at 04/24/17 0608  . dextromethorphan-guaiFENesin (MUCINEX DM) 30-600 MG per 12 hr tablet  1 tablet  1 tablet Oral BID Barton Dubois, MD   1 tablet at 04/24/17 0931  . diphenhydrAMINE (BENADRYL) 12.5 MG/5ML elixir 12.5 mg  12.5 mg Oral Q6H PRN Mannam, Praveen, MD      . famotidine (PEPCID) tablet 20 mg  20 mg Oral QHS Brand Males, MD   20 mg at 04/23/17 2132  . guaiFENesin-dextromethorphan (ROBITUSSIN DM) 100-10 MG/5ML syrup 5 mL  5 mL Oral Q4H PRN Barton Dubois, MD   5 mL at 04/19/17 0535  . heparin injection 5,000 Units  5,000 Units Subcutaneous Q8H Barton Dubois, MD   5,000  Units at 04/24/17 2330  . hydrALAZINE (APRESOLINE) injection 10 mg  10 mg Intravenous Q8H PRN Barton Dubois, MD      . ipratropium-albuterol (DUONEB) 0.5-2.5 (3) MG/3ML nebulizer solution 3 mL  3 mL Nebulization TID Brand Males, MD   3 mL at 04/24/17 1251  . labetalol (NORMODYNE,TRANDATE) injection 10 mg  10 mg Intravenous Q2H PRN Bodenheimer, Charles A, NP      . levothyroxine (SYNTHROID, LEVOTHROID) tablet 75 mcg  75 mcg Oral QAC breakfast Mannam, Praveen, MD   75 mcg at 04/24/17 0900  . lisinopril (PRINIVIL,ZESTRIL) tablet 2.5 mg  2.5 mg Oral Daily Thurnell Lose, MD      . MEDLINE mouth rinse  15 mL Mouth Rinse BID Brand Males, MD   15 mL at 04/23/17 1023  . multivitamin with minerals tablet 1 tablet  1 tablet Oral Daily Brand Males, MD   1 tablet at 04/24/17 0932  . nicotine (NICODERM CQ - dosed in mg/24 hours) patch 14 mg  14 mg Transdermal Daily Barton Dubois, MD   14 mg at 04/24/17 0931  . phenol (CHLORASEPTIC) mouth spray 1 spray  1 spray Mouth/Throat PRN Mannam, Praveen, MD      . sodium chloride flush (NS) 0.9 % injection 3 mL  3 mL Intravenous Q12H Barton Dubois, MD   3 mL at 04/23/17 2144  . sodium chloride flush (NS) 0.9 % injection 3 mL  3 mL Intravenous PRN Barton Dubois, MD         Discharge Medications: Please see discharge summary for a list of discharge medications.  Relevant Imaging Results:  Relevant Lab Results:   Additional Information SSN: [id number]  Estanislado Emms, LCSW

## 2017-04-24 NOTE — NC FL2 (Signed)
[place] LEVEL OF CARE SCREENING TOOL     IDENTIFICATION  Patient Name: Jamie Hicks Sutter Birthdate: 03-Jan-1956 Sex: female Admission Date (Current Location): 04/18/2017  [facility] and [place] Number:  Herbalist and Address:  [facility],  [address] [place], [place]      Provider Number: [id number]  Attending Physician Name and Address:  Thurnell Lose, MD  Relative Name and Phone Number:  [name], daughter, [phone]    Current Level of Care: Hospital Recommended Level of Care: [place] Prior Approval Number:    Date Approved/Denied:   PASRR Number: [id number]  Discharge Plan: SNF    Current Diagnoses: Patient Active Problem List   Diagnosis Date Noted  . Acute respiratory failure with hypoxia ([place])   . ARDS (adult respiratory distress syndrome) ([place])   . Acute respiratory distress   . Community acquired pneumonia of left upper lobe of lung ([place])   . Hypoxemia   . Influenza A 04/18/2017  . Left upper lobe pneumonia ([place]) 04/18/2017  . Obesity, Class III, BMI 40-49.9 (morbid obesity) ([place]) 04/18/2017  . Thyroid activity decreased   . Cocaine dependence with cocaine-induced mood disorder ([place]) 07/22/2014  . Major depressive disorder, recurrent, severe without psychotic features ([place])   . Substance induced mood disorder ([place]) 07/19/2014  . Suicidal ideation   . Homicidal ideation   . Osteoarthritis 09/24/2012  . Prediabetes 09/24/2012  . Dyslipidemia 09/24/2012  . Vitamin D insufficiency 09/24/2012  . Hypothyroidism 09/22/2012  . Bilateral chronic knee pain 09/22/2012  . Smoker 09/22/2012  . Essential hypertension 09/22/2012    Orientation RESPIRATION BLADDER Height & Weight     Self, Time, Situation, Place  O2(nasal cannula 2L) Continent Weight: 225 lb 8.5 oz (102.3 kg) Height:  5\' 9"  (175.3 cm)  BEHAVIORAL SYMPTOMS/MOOD NEUROLOGICAL BOWEL NUTRITION STATUS      Incontinent  Diet(please see DC summary)  AMBULATORY STATUS COMMUNICATION OF NEEDS Skin   Extensive Assist Verbally Normal                       Personal Care Assistance Level of Assistance  Bathing, Feeding, Dressing Bathing Assistance: Limited assistance Feeding assistance: Independent Dressing Assistance: Limited assistance     Functional Limitations Info  Sight, Hearing, Speech Sight Info: Adequate Hearing Info: Adequate Speech Info: Adequate    SPECIAL CARE FACTORS FREQUENCY  PT (By licensed PT)     PT Frequency: 5x/week              Contractures Contractures Info: Not present    Additional Factors Info  Code Status, Allergies Code Status Info: Full Allergies Info: Amoxicillin, Latex           Current Medications (04/24/2017):  This is the current hospital active medication list Current Facility-Administered Medications  Medication Dose Route Frequency Provider Last Rate Last Dose  . 0.9 %  sodium chloride infusion  250 mL Intravenous PRN Barton Dubois, MD      . albuterol (PROVENTIL) (2.5 MG/3ML) 0.083% nebulizer solution 2.5 mg  2.5 mg Nebulization Q4H PRN Collene Gobble, MD      . carvedilol (COREG) tablet 3.125 mg  3.125 mg Oral BID WC Thurnell Lose, MD      . ceFEPIme (MAXIPIME) 1 g in dextrose 5 % 50 mL IVPB  1 g Intravenous Q8H Randa Spike, RPH   Stopped at 04/24/17 0608  . dextromethorphan-guaiFENesin (MUCINEX DM) 30-600 MG per 12 hr  tablet 1 tablet  1 tablet Oral BID Barton Dubois, MD   1 tablet at 04/24/17 0931  . diphenhydrAMINE (BENADRYL) 12.5 MG/5ML elixir 12.5 mg  12.5 mg Oral Q6H PRN Mannam, Praveen, MD      . famotidine (PEPCID) tablet 20 mg  20 mg Oral QHS Brand Males, MD   20 mg at 04/23/17 2132  . guaiFENesin-dextromethorphan (ROBITUSSIN DM) 100-10 MG/5ML syrup 5 mL  5 mL Oral Q4H PRN Barton Dubois, MD   5 mL at 04/19/17 0535  . heparin injection 5,000 Units  5,000 Units Subcutaneous Q8H Barton Dubois, MD   5,000 Units at  04/24/17 2119  . hydrALAZINE (APRESOLINE) injection 10 mg  10 mg Intravenous Q8H PRN Barton Dubois, MD      . ipratropium-albuterol (DUONEB) 0.5-2.5 (3) MG/3ML nebulizer solution 3 mL  3 mL Nebulization TID Brand Males, MD   3 mL at 04/24/17 1251  . labetalol (NORMODYNE,TRANDATE) injection 10 mg  10 mg Intravenous Q2H PRN Bodenheimer, Charles A, NP      . levothyroxine (SYNTHROID, LEVOTHROID) tablet 75 mcg  75 mcg Oral QAC breakfast Mannam, Praveen, MD   75 mcg at 04/24/17 0900  . lisinopril (PRINIVIL,ZESTRIL) tablet 2.5 mg  2.5 mg Oral Daily Thurnell Lose, MD      . MEDLINE mouth rinse  15 mL Mouth Rinse BID Brand Males, MD   15 mL at 04/23/17 1023  . multivitamin with minerals tablet 1 tablet  1 tablet Oral Daily Brand Males, MD   1 tablet at 04/24/17 0932  . nicotine (NICODERM CQ - dosed in mg/24 hours) patch 14 mg  14 mg Transdermal Daily Barton Dubois, MD   14 mg at 04/24/17 0931  . phenol (CHLORASEPTIC) mouth spray 1 spray  1 spray Mouth/Throat PRN Mannam, Praveen, MD      . sodium chloride flush (NS) 0.9 % injection 3 mL  3 mL Intravenous Q12H Barton Dubois, MD   3 mL at 04/23/17 2144  . sodium chloride flush (NS) 0.9 % injection 3 mL  3 mL Intravenous PRN Barton Dubois, MD         Discharge Medications: Please see discharge summary for a list of discharge medications.  Relevant Imaging Results:  Relevant Lab Results:   Additional Information SSN: [id number]  Estanislado Emms, LCSW

## 2017-04-25 ENCOUNTER — Encounter (HOSPITAL_COMMUNITY): Payer: Self-pay | Admitting: Student

## 2017-04-25 DIAGNOSIS — I5023 Acute on chronic systolic (congestive) heart failure: Secondary | ICD-10-CM

## 2017-04-25 DIAGNOSIS — I5021 Acute systolic (congestive) heart failure: Secondary | ICD-10-CM | POA: Insufficient documentation

## 2017-04-25 LAB — BASIC METABOLIC PANEL
ANION GAP: 11 (ref 5–15)
BUN: 35 mg/dL — ABNORMAL HIGH (ref 6–20)
CALCIUM: 10 mg/dL (ref 8.9–10.3)
CHLORIDE: 100 mmol/L — AB (ref 101–111)
CO2: 26 mmol/L (ref 22–32)
CREATININE: 1.14 mg/dL — AB (ref 0.44–1.00)
GFR calc non Af Amer: 51 mL/min — ABNORMAL LOW (ref >60)
GFR, EST AFRICAN AMERICAN: 59 mL/min — AB (ref >60)
GLUCOSE: 115 mg/dL — AB (ref 65–99)
Potassium: 4.3 mmol/L (ref 3.5–5.1)
Sodium: 137 mmol/L (ref 135–145)

## 2017-04-25 LAB — MAGNESIUM: MAGNESIUM: 2.1 mg/dL (ref 1.7–2.4)

## 2017-04-25 LAB — PHOSPHORUS: Phosphorus: 3.8 mg/dL (ref 2.5–4.6)

## 2017-04-25 MED ORDER — LOSARTAN POTASSIUM 25 MG PO TABS
25.0000 mg | ORAL_TABLET | Freq: Every day | ORAL | Status: DC
  Administered 2017-04-25: 25 mg via ORAL
  Filled 2017-04-25: qty 1

## 2017-04-25 MED ORDER — FUROSEMIDE 20 MG PO TABS: 20.0000 mg | ORAL_TABLET | Freq: Every day | ORAL | 11 refills | Status: DC

## 2017-04-25 MED ORDER — CARVEDILOL 3.125 MG PO TABS: 3.1250 mg | ORAL_TABLET | Freq: Two times a day (BID) | ORAL | Status: DC

## 2017-04-25 MED ORDER — LOSARTAN POTASSIUM 25 MG PO TABS: 25.0000 mg | ORAL_TABLET | Freq: Every day | ORAL | Status: DC

## 2017-04-25 NOTE — Progress Notes (Signed)
Pt made following request this shift. She feels she is capable of making her own decisions regarding her health. She wants daughter Jamie Hicks to be present when she speaks to SW and Jamie Hicks is also to be contacted first for anything regarding her health.

## 2017-04-25 NOTE — Plan of Care (Signed)
Pt able to sit up on side of the bed.

## 2017-04-25 NOTE — Clinical Social Work Placement (Signed)
Patient received and accepted bed offer at [facility]. Facility aware of patient's discharge and confirmed bed offer. PTAR contacted, patient's family notified. Patient's RN can call report to [phone], packet complete. CSW signing off, no other needs identified.  CLINICAL SOCIAL WORK PLACEMENT  NOTE  Date:  04/25/2017  Patient Details  Name: Jamie Hicks MRN: [id number] Date of Birth: November 30, 1955  Clinical Social Work is seeking post-discharge placement for this patient at the [place] level of care (*CSW will initial, date and re-position this form in  chart as items are completed):  Yes   Patient/family provided with [employer] Work Department's list of facilities offering this level of care within the geographic area requested by the patient (or if unable, by the patient's family).  Yes   Patient/family informed of their freedom to choose among providers that offer the needed level of care, that participate in Medicare, Medicaid or managed care program needed by the patient, have an available bed and are willing to accept the patient.  Yes   Patient/family informed of [place]'s ownership interest in [facility] and [facility], as well as of the fact that they are under no obligation to receive care at these facilities.  PASRR submitted to EDS on       PASRR number received on 04/24/17     Existing PASRR number confirmed on       FL2 transmitted to all facilities in geographic area requested by pt/family on 04/24/17     FL2 transmitted to all facilities within larger geographic area on       Patient informed that his/her managed care company has contracts with or will negotiate with certain facilities, including the following:        Yes   Patient/family informed of bed offers received.  Patient chooses bed at [facility]     Physician recommends and patient chooses bed at      Patient to be  transferred to [facility] on 04/25/17.  Patient to be transferred to facility by PTAR     Patient family notified on 04/25/17 of transfer.  Name of family member notified:  Maximino Sarin     PHYSICIAN       Additional Comment:    _______________________________________________ Burnis Medin, LCSW 04/25/2017, 2:37 PM

## 2017-04-25 NOTE — Discharge Summary (Signed)
Jamie Hicks FTD:[id number] DOB: 1956/01/28 DOA: 04/18/2017  PCP: Patient, No Pcp Per  Admit date: 04/18/2017  Discharge date: 04/25/2017  Admitted From: Home   Disposition:  SNF   Recommendations for Outpatient Follow-up:   Follow up with PCP in 1-2 weeks  PCP Please obtain BMP/CBC, 2 view CXR in 1week,  (see Discharge instructions)   PCP Please follow up on the following pending results: Monitor BMP, weight and diuretic dose closely.  Needs outpatient cardiology follow-up within the next 1-2 weeks.   Home Health: None   Equipment/Devices: None  Consultations: Cards, PCCM Discharge Condition: Fair   CODE STATUS: Full   Diet Recommendation:  Heart Healthy , 1.5 L/day total fluid restriction   Chief Complaint  Patient presents with  . Shortness of Breath     Brief history of present illness from the day of admission and additional interim summary      61 year old female with PMH as below, which is significant for HTN, morbid obesity, and hypothyroidism. She presented to [place] 11/5 with complaints of SOB, nonproductive cough, malaise, and muscle aches x 3 days. Upon arrival to the ED she was febrile. She underwent CT angio given concerns for PE. This was negative for PE, however, demonstrated LUL opacification thought to represent pneumonia. Rapid flu PCR positive for Flu A. She was admitted to SDU and the hospitalist team and was treated with IV antibiotics and tamiflu.  However she clinically deteriorated and required endotracheal intubation and admission to ICU under pulmonary critical care, she was also found to have systolic CHF, she was stabilized and transferred to hospitalist service again on 04/24/2017.                                                                   Hospital  Course    1.  Acute hypoxic and hypercarbic respiratory failure due to combination of influenza A pneumonia, possible superimposed bacterial pneumonia along with acute on chronic systolic CHF along with possible ARDS.  Required intubation, successfully extubated, he has now completed her antibiotic and Tamiflu course, clinically appears nontoxic and infection seems to have resolved, repeat 2 view chest x-ray stable.  Also has been diuresed adequately.  At this time she will be discharged to SNF for generalized weakness and debility.   2.  Acute on chronic systolic CHF.  EF 35-40% with diffuse hypokinesis.  Clinically close to being compensated, IV Lasix discontinued on 04/24/2017 and will reevaluate tomorrow, placed on Coreg along with low-dose ARB, seen by cardiology who wants to follow outpatient, I am placing her on low-dose Lasix upon discharge currently appears close to euvolemic, request SNF attempting to kindly monitor patient's weight, BMP and diuretic dose closely and arrange for outpatient cardiology follow-up in 1-2 weeks.  3.  Hypothyroidism.  Continue Synthroid.  4.  Smoking, possible cocaine abuse and substance abuse.  Counseled to quit all, she has promised she is will not be using cocaine in the future, has been told that cocaine and Coreg are incompatible, and using cocaine in presence of Coreg can cause death.  She understands it.  Wants to be on Coreg to benefit her heart and promises not to use cocaine again.  5.  Hypertension.  Placed on Coreg and ARB will monitor, if needed add hydralazine at SNF.  6.  Underlying COPD.  No acute issues.  No wheezing, supportive care as needed.      Discharge diagnosis     Principal Problem:   Influenza A Active Problems:   Essential hypertension   Major depressive disorder, recurrent, severe without psychotic features ([place])   Thyroid activity decreased   Left upper lobe pneumonia (HCC)   Obesity, Class III, BMI 40-49.9 (morbid  obesity) ([place])   Acute respiratory distress   Community acquired pneumonia of left upper lobe of lung (HCC)   Hypoxemia   Acute respiratory failure with hypoxia (HCC)   ARDS (adult respiratory distress syndrome) (HCC)   Acute on chronic systolic heart failure [facility])    Discharge instructions    Discharge Instructions    Discharge instructions   Complete by:  As directed    Follow with Primary MD in 7 days   Get CBC, CMP, 2 view Chest X ray checked  by  SNF MD in 5-7 days    Activity: As tolerated with Full fall precautions use walker/cane & assistance as needed  Disposition SNF  Diet:   Heart Healthy -  Check your Weight same time everyday, if you gain over 2 pounds, or you develop in leg swelling, experience more shortness of breath or chest pain, call your Primary MD immediately. Follow Cardiac Low Salt Diet and 1.5 lit/day fluid restriction.  On your next visit with your primary care physician please Get Medicines reviewed and adjusted.  Please request your Prim.MD to go over all Hospital Tests and Procedure/Radiological results at the follow up, please get all Hospital records sent to your Prim MD by signing hospital release before you go home.  If you experience worsening of your admission symptoms, develop shortness of breath, life threatening emergency, suicidal or homicidal thoughts you must seek medical attention immediately by calling 911 or calling your MD immediately  if symptoms less severe.  You Must read complete instructions/literature along with all the possible adverse reactions/side effects for all the Medicines you take and that have been prescribed to you. Take any new Medicines after you have completely understood and accpet all the possible adverse reactions/side effects.   Do not drive, operate heavy machinery, perform activities at heights, swimming or participation in water activities or provide baby sitting services if your were admitted for syncope or  siezures until you have seen by Primary MD or a Neurologist and advised to do so again.  Do not drive when taking Pain medications.    Do not take more than prescribed Pain, Sleep and Anxiety Medications  Special Instructions: If you have smoked or chewed Tobacco  in the last 2 yrs please stop smoking, stop any regular Alcohol  and or any Recreational drug use.  Wear Seat belts while driving.   Please note  You were cared for by a hospitalist during your hospital stay. If you have any questions about your discharge medications or the care you received while you  were in the hospital after you are discharged, you can call the unit and asked to speak with the hospitalist on call if the hospitalist that took care of you is not available. Once you are discharged, your primary care physician will handle any further medical issues. Please note that NO REFILLS for any discharge medications will be authorized once you are discharged, as it is imperative that you return to your primary care physician (or establish a relationship with a primary care physician if you do not have one) for your aftercare needs so that they can reassess your need for medications and monitor your lab values.   Increase activity slowly   Complete by:  As directed       Discharge Medications   Allergies as of 04/25/2017      Reactions   Amoxicillin    Has patient had a PCN reaction causing immediate rash, facial/tongue/throat swelling, SOB or lightheadedness with hypotension: yes Has patient had a PCN reaction causing severe rash involving mucus membranes or skin necrosis: no Has patient had a PCN reaction that required hospitalization: yes  Has patient had a PCN reaction occurring within the last 10 years: yes, 08/29/16 If all of the above answers are "NO", then may proceed with Cephalosporin use.   Lisinopril Cough   Latex Rash      Medication List    STOP taking these medications   amLODipine 5 MG  tablet Commonly known as:  NORVASC   hydrOXYzine 50 MG tablet Commonly known as:  ATARAX/VISTARIL   ibuprofen 800 MG tablet Commonly known as:  ADVIL,MOTRIN   traMADol 50 MG tablet Commonly known as:  ULTRAM   zolpidem 5 MG tablet Commonly known as:  AMBIEN     TAKE these medications   carvedilol 3.125 MG tablet Commonly known as:  COREG Take 1 tablet (3.125 mg total) 2 (two) times daily with a meal by mouth.   cyclobenzaprine 10 MG tablet Commonly known as:  FLEXERIL Take 1 tablet (10 mg total) by mouth at bedtime.   diphenhydrAMINE 25 MG tablet Commonly known as:  BENADRYL Take 1 tablet (25 mg total) by mouth every 6 (six) hours as needed.   furosemide 20 MG tablet Commonly known as:  LASIX Take 1 tablet (20 mg total) daily by mouth.   ipratropium 0.06 % nasal spray Commonly known as:  ATROVENT Place 2 sprays into both nostrils 4 (four) times daily. For allergies   levothyroxine 75 MCG tablet Commonly known as:  SYNTHROID, LEVOTHROID Take 75 mcg by mouth every morning. What changed:  Another medication with the same name was removed. Continue taking this medication, and follow the directions you see here.   loratadine 10 MG tablet Commonly known as:  CLARITIN Take 1 tablet (10 mg total) by mouth daily. (This medicine may be purchased from over there counter at yr local pharmacy): For allergies   losartan 25 MG tablet Commonly known as:  COZAAR Take 1 tablet (25 mg total) daily by mouth. Start taking on:  04/26/2017   nicotine 14 mg/24hr patch Commonly known as:  NICODERM CQ - dosed in mg/24 hours Place 1 patch (14 mg total) onto the skin daily. For nicotine addiction   ranitidine 150 MG tablet Commonly known as:  ZANTAC Take 1 tablet (150 mg total) by mouth 2 (two) times daily.       Follow-up Information    Hilty, Nadean Corwin, MD. Schedule an appointment as soon as possible for a visit in 1  week(s).   Specialty:  Cardiology Contact information: [address] [place] [address] [phone]           Major procedures and Radiology Reports - PLEASE review detailed and final reports thoroughly  -         Dg Chest 2 View  Result Date: 04/24/2017 CLINICAL DATA:  Shortness of breath.  Dry cough. EXAM: CHEST  2 VIEW COMPARISON:  04/23/2017 FINDINGS: Enlarged cardiac silhouette. Calcific atherosclerotic disease of the aorta. There is no evidence of focal airspace consolidation, pleural effusion or pneumothorax. Osseous structures are without acute abnormality. Soft tissues are grossly normal. IMPRESSION: Enlarged cardiac silhouette. Calcific atherosclerotic disease of the aorta. Electronically Signed   By: Fidela Salisbury M.D.   On: 04/24/2017 14:56   Dg Chest 2 View  Result Date: 04/18/2017 CLINICAL DATA:  61 year old female with shortness breath, dry cough and body aches with fever for the past week. Hypertension, smoking and thyroid disease. Initial encounter. EXAM: CHEST  2 VIEW COMPARISON:  05/25/2016 chest x-ray. FINDINGS: Cardiomegaly. Pulmonary vascular congestion. No segmental consolidation. No pneumothorax. Exam limited by habitus and technique however, no plain film evidence pulmonary malignancy. Calcified mildly tortuous aorta. Bilateral shoulder joint degenerative changes. Cannot exclude humeral head avascular necrosis. Mild degenerative changes thoracic spine without focal compression fracture. IMPRESSION: Cardiomegaly. Pulmonary vascular congestion. No segmental consolidation. Aortic Atherosclerosis (ICD10-I70.0). Calcified mildly tortuous aorta. Bilateral shoulder joint degenerative changes. Cannot exclude humeral head avascular necrosis. Electronically Signed   By: Genia Del M.D.   On: 04/18/2017 09:39   Dg Abd 1 View  Result Date: 04/19/2017 CLINICAL DATA:  OG tube placement EXAM: ABDOMEN - 1 VIEW COMPARISON:  05/15/2009, 04/19/2017 FINDINGS: Cardiomegaly with left lower lobe opacity. Esophageal tube  tip projects over the right abdomen, likely the proximal duodenum IMPRESSION: Esophageal tube tip visible in the right mid abdomen, probably within the duodenum. Electronically Signed   By: Donavan Foil M.D.   On: 04/19/2017 23:59   Ct Angio Chest Pe W And/or Wo Contrast  Result Date: 04/18/2017 CLINICAL DATA:  Shortness of breath. EXAM: CT ANGIOGRAPHY CHEST WITH CONTRAST TECHNIQUE: Multidetector CT imaging of the chest was performed using the standard protocol during bolus administration of intravenous contrast. Multiplanar CT image reconstructions and MIPs were obtained to evaluate the vascular anatomy. CONTRAST:  100 mL of Isovue 370 intravenously. COMPARISON:  None. FINDINGS: Cardiovascular: Satisfactory opacification of the pulmonary arteries to the segmental level. No evidence of pulmonary embolism. Mild cardiomegaly is noted. No pericardial effusion. Mediastinum/Nodes: No enlarged mediastinal, hilar, or axillary lymph nodes. Thyroid gland, trachea, and esophagus demonstrate no significant findings. Lungs/Pleura: No pneumothorax or pleural effusion is noted. Mild left upper lobe infiltrate or atelectasis is noted. Upper Abdomen: No acute abnormality. Musculoskeletal: No chest wall abnormality. No acute or significant osseous findings. Review of the MIP images confirms the above findings. IMPRESSION: No evidence of pulmonary embolus. Mild cardiomegaly. Mild left upper lobe infiltrate or atelectasis is noted. Electronically Signed   By: Marijo Conception, M.D.   On: 04/18/2017 12:02   Dg Chest Port 1 View  Result Date: 04/23/2017 CLINICAL DATA:  Acute respiratory failure with hypoxia EXAM: PORTABLE CHEST 1 VIEW COMPARISON:  04/22/2017 FINDINGS: Endotracheal tube and NG tube have been removed since the prior study. Marked cardiac enlargement unchanged. Pulmonary vascular congestion with mild edema has improved. Bibasilar atelectasis has improved. No effusion. IMPRESSION: Endotracheal tube and NG tube  removed. Improvement in heart failure and bibasilar atelectasis since the prior  study. Electronically Signed   By: Franchot Gallo M.D.   On: 04/23/2017 06:41   Dg Chest Port 1 View  Result Date: 04/22/2017 CLINICAL DATA:  Respiratory failure.  Endotracheal tube EXAM: PORTABLE CHEST 1 VIEW COMPARISON:  Three days ago FINDINGS: Endotracheal tube tip at the clavicular heads. An orogastric tube reaches the stomach. Cardiopericardial enlargement that is stable. Worsening aeration at the right base where the diaphragm is less well defined. Bilateral streaky perihilar opacity. IMPRESSION: 1. Stable positioning of endotracheal and orogastric tubes. 2. Influenza with perihilar atelectasis and bronchopneumonia. Mild worsening aeration at the right base. 3. Cardiomegaly. Electronically Signed   By: Monte Fantasia M.D.   On: 04/22/2017 06:37   Dg Chest Port 1 View  Result Date: 04/20/2017 CLINICAL DATA:  Intubated EXAM: PORTABLE CHEST 1 VIEW COMPARISON:  04/19/2017 FINDINGS: Endotracheal tube tip is about 4 cm superior to carina. Esophageal tube tip is below the diaphragm. Cardiomegaly with diffuse bilateral interstitial and alveolar opacity consistent with edema, slightly decreased. Small left effusion. Dense left lower lobe consolidation. IMPRESSION: 1. Endotracheal tube tip about 4 cm superior to carina 2. Decreased interstitial and alveolar infiltrates or edema 3. Cardiomegaly. Dense left lower lobe consolidation and small left effusion. Electronically Signed   By: Donavan Foil M.D.   On: 04/20/2017 00:00   Dg Chest Port 1 View  Result Date: 04/19/2017 CLINICAL DATA:  Altered mental status an high fever. EXAM: PORTABLE CHEST 1 VIEW COMPARISON:  CT of the chest 04/18/2017 FINDINGS: The cardiac silhouette is enlarged. There is no evidence of pleural effusion or pneumothorax. Bilateral widespread interstitial and alveolar airspace opacities with central predominance. Osseous structures are without acute  abnormality. Soft tissues are grossly normal. IMPRESSION: Interval development of bilateral widespread interstitial and alveolar opacities with central predominance. This may represent a mixed pattern rapidly evolving pulmonary edema or multifocal airspace consolidation. Electronically Signed   By: Fidela Salisbury M.D.   On: 04/19/2017 20:18   Dg Abd Portable 1v  Result Date: 04/20/2017 CLINICAL DATA:  OG tube placement EXAM: PORTABLE ABDOMEN - 1 VIEW COMPARISON:  04/19/2017 FINDINGS: Cardiomegaly. Consolidation at the left lung base. Esophageal tube tip overlies the mid abdomen. Nonobstructed gas pattern. IMPRESSION: Esophageal tube tip projects over the mid abdomen, possibly distal stomach. Contrast injection could be obtained to confirm intragastric location. Electronically Signed   By: Donavan Foil M.D.   On: 04/20/2017 22:20    Micro Results     Recent Results (from the past 240 hour(s))  Blood culture (routine x 2)     Status: None   Collection Time: 04/18/17 10:14 AM  Result Value Ref Range Status   Specimen Description BLOOD BLOOD LEFT FOREARM  Final   Special Requests   Final    BOTTLES DRAWN AEROBIC AND ANAEROBIC Blood Culture adequate volume   Culture   Final    NO GROWTH 5 DAYS Performed at [facility], [address], [place], [place] [address]    Report Status 04/23/2017 FINAL  Final  Blood culture (routine x 2)     Status: None   Collection Time: 04/18/17 10:14 AM  Result Value Ref Range Status   Specimen Description BLOOD RIGHT HAND  Final   Special Requests   Final    BOTTLES DRAWN AEROBIC AND ANAEROBIC Blood Culture adequate volume   Culture   Final    NO GROWTH 5 DAYS Performed at [facility], [place] [address], [place], [place] [address]  Report Status 04/23/2017 FINAL  Final  Urine culture     Status: None   Collection Time: 04/19/17  7:40 PM  Result Value Ref Range Status   Specimen Description URINE, RANDOM  Final   Special Requests NONE   Final   Culture   Final    NO GROWTH Performed at [facility], [place] [address] [place], [place] [address]    Report Status 04/21/2017 FINAL  Final  MRSA PCR Screening     Status: None   Collection Time: 04/19/17  7:58 PM  Result Value Ref Range Status   MRSA by PCR NEGATIVE NEGATIVE Final    Comment:        The GeneXpert MRSA Assay (FDA approved for NASAL specimens only), is one component of a comprehensive MRSA colonization surveillance program. It is not intended to diagnose MRSA infection nor to guide or monitor treatment for MRSA infections.   Culture, blood (routine x 2)     Status: None   Collection Time: 04/19/17  8:32 PM  Result Value Ref Range Status   Specimen Description BLOOD RIGHT ARM  Final   Special Requests   Final    BOTTLES DRAWN AEROBIC ONLY Blood Culture adequate volume   Culture   Final    NO GROWTH 5 DAYS Performed at [facility], [address], [place], [place] [address]    Report Status 04/24/2017 FINAL  Final  Culture, blood (routine x 2)     Status: None   Collection Time: 04/19/17  8:37 PM  Result Value Ref Range Status   Specimen Description BLOOD RIGHT ARM  Final   Special Requests   Final    BOTTLES DRAWN AEROBIC ONLY Blood Culture adequate volume   Culture   Final    NO GROWTH 5 DAYS Performed at [facility], [place] [address] [place], [place] [address]    Report Status 04/24/2017 FINAL  Final  Culture, respiratory (NON-Expectorated)     Status: None   Collection Time: 04/20/17 12:38 AM  Result Value Ref Range Status   Specimen Description TRACHEAL ASPIRATE  Final   Special Requests NONE  Final   Gram Stain NO WBC SEEN NO ORGANISMS SEEN   Final   Culture   Final    Consistent with normal respiratory flora. Performed at [facility], [place] [address], [place], [place] [address]    Report Status 04/22/2017 FINAL  Final    Today   Subjective    Jamie Hicks today has no headache,no chest abdominal  pain,no new weakness tingling or numbness, feels much better wants to go to SNF today    Objective   Blood pressure 129/89, pulse 60, temperature 98.1 F (36.7 C), temperature source Oral, resp. rate 18, height 5\' 9"  (1.753 m), weight 102.3 kg (225 lb 8.5 oz), last menstrual period 07/21/2014, SpO2 100 %.   Intake/Output Summary (Last 24 hours) at 04/25/2017 1129 Last data filed at 04/25/2017 1106 Gross per 24 hour  Intake 720 ml  Output -  Net 720 ml    Exam  Awake Alert, Oriented x 3, No new F.N deficits, Normal affect [place].AT,PERRAL Supple Neck,No JVD, No cervical lymphadenopathy appriciated.  Symmetrical Chest wall movement, Good air movement bilaterally, CTAB RRR,No Gallops,Rubs or new Murmurs, No Parasternal Heave +ve B.Sounds, Abd Soft, Non tender, No organomegaly appriciated, No rebound -guarding or rigidity. No Cyanosis, Clubbing or edema, No new Rash or bruise   Data Review  CBC w Diff:  Lab Results  Component Value Date   WBC 5.0 04/23/2017   HGB 11.2 (L) 04/23/2017   HCT 34.7 (L) 04/23/2017   PLT 223 04/23/2017   LYMPHOPCT 4 04/19/2017   MONOPCT 3 04/19/2017   EOSPCT 0 04/19/2017   BASOPCT 0 04/19/2017    CMP:  Lab Results  Component Value Date   NA 137 04/25/2017   K 4.3 04/25/2017   CL 100 (L) 04/25/2017   CO2 26 04/25/2017   BUN 35 (H) 04/25/2017   CREATININE 1.14 (H) 04/25/2017   PROT 7.8 04/19/2017   ALBUMIN 3.6 04/19/2017   BILITOT 0.5 04/19/2017   ALKPHOS 89 04/19/2017   AST 94 (H) 04/19/2017   ALT 46 04/19/2017  .   Total Time in preparing paper work, data evaluation and todays exam - 61 minutes  Lala Lund M.D on 04/25/2017 at 11:29 AM  [facility]   Office  [phone]

## 2017-04-25 NOTE — Discharge Instructions (Signed)
Follow with Primary MD in 7 days   Get CBC, CMP, 2 view Chest X ray checked  by  SNF MD in 5-7 days    Activity: As tolerated with Full fall precautions use walker/cane & assistance as needed  Disposition SNF  Diet:   Heart Healthy -  Check your Weight same time everyday, if you gain over 2 pounds, or you develop in leg swelling, experience more shortness of breath or chest pain, call your Primary MD immediately. Follow Cardiac Low Salt Diet and 1.5 lit/day fluid restriction.  On your next visit with your primary care physician please Get Medicines reviewed and adjusted.  Please request your Prim.MD to go over all Hospital Tests and Procedure/Radiological results at the follow up, please get all Hospital records sent to your Prim MD by signing hospital release before you go home.  If you experience worsening of your admission symptoms, develop shortness of breath, life threatening emergency, suicidal or homicidal thoughts you must seek medical attention immediately by calling 911 or calling your MD immediately  if symptoms less severe.  You Must read complete instructions/literature along with all the possible adverse reactions/side effects for all the Medicines you take and that have been prescribed to you. Take any new Medicines after you have completely understood and accpet all the possible adverse reactions/side effects.   Do not drive, operate heavy machinery, perform activities at heights, swimming or participation in water activities or provide baby sitting services if your were admitted for syncope or siezures until you have seen by Primary MD or a Neurologist and advised to do so again.  Do not drive when taking Pain medications.    Do not take more than prescribed Pain, Sleep and Anxiety Medications  Special Instructions: If you have smoked or chewed Tobacco  in the last 2 yrs please stop smoking, stop any regular Alcohol  and or any Recreational drug use.  Wear Seat belts while  driving.   Please note  You were cared for by a hospitalist during your hospital stay. If you have any questions about your discharge medications or the care you received while you were in the hospital after you are discharged, you can call the unit and asked to speak with the hospitalist on call if the hospitalist that took care of you is not available. Once you are discharged, your primary care physician will handle any further medical issues. Please note that NO REFILLS for any discharge medications will be authorized once you are discharged, as it is imperative that you return to your primary care physician (or establish a relationship with a primary care physician if you do not have one) for your aftercare needs so that they can reassess your need for medications and monitor your lab values.

## 2017-04-25 NOTE — Consult Note (Signed)
Cardiology Consult    Patient ID: Jamie Hicks; [id number]; 1955-11-03   Admit date: 04/18/2017 Date of Consult: 04/25/2017  Primary Care Provider: Patient, No Pcp Per Primary Cardiologist: New to [facility] - Dr. Debara Pickett   Patient Profile    Jamie Hicks is a 61 y.o. female with past medical history of HTN, Hypothyroidism, obesity, substance use (cocoaine, alcohol, and tobacco use), depression, and medical noncompliance who is being seen today for the evaluation of CHF at the request of Dr. Candiss Norse.   History of Present Illness    Jamie Hicks presented to [facility] ED on 04/18/2017 for worsening dyspnea and a productive cough over the past several days. She was febrile and tachycardiac upon arrival to the ED. Initial labs showed a TSH of 22.555, D-dimer 1.00, and Influenza A PCR Positive. CTA showed no evidence of PE but a mild left upper lobe infiltrate was noted.   She was admitted and started on Azithromycin along with Rocephin and Tamiflu. On 04/19/2017, she was noted to have confusion and worsening dyspnea, found to be in acute hypoxic respiratory failure. She was transferred to the SDU and required intubation. Was also started on IV Lasix at that time. She was successfully extubated on 11/9 and placed on 4L . She has diuresed over -5.3L with weight down from 240 lbs on admission to 225 lbs on 11/10. IV Lasix was discontinued on 04/24/2017.  An echocardiogram was obtained on 04/20/2017 and showed a reduced EF of 35-40%, diffuse HK, mild MR, and a trivial pericardial effusion. Cardiology is now asked to see the patient for further management of her reduced EF.  In talking with the patient today, she reports overall feeling well when compared to her initial presentation. She denies any recent chest pain or palpitations and reports breathing is at baseline. She still feels weak and fatigued. Denies any recent orthopnea, PND, or lower extremity edema.   She denies any prior history of CAD  or CHF. No known HLD or Type 2 DM. No known family history of CAD. She does have a significant history of substance use including Cocaine over the past 20+ years (reports last using 1 month ago) along with alcohol use (1+ pints of Liquor per day). She reports being determined to quit using these substances but is concerned about her ability to refrain upon returning to her home environment as she sits on the porch with "friends" every evening and engages in these activities.    Past Medical History   Past Medical History:  Diagnosis Date  . Back pain   . Hypertension   . Thyroid disease      Allergies:   Allergies  Allergen Reactions  . Amoxicillin     Has patient had a PCN reaction causing immediate rash, facial/tongue/throat swelling, SOB or lightheadedness with hypotension: yes Has patient had a PCN reaction causing severe rash involving mucus membranes or skin necrosis: no Has patient had a PCN reaction that required hospitalization: yes  Has patient had a PCN reaction occurring within the last 10 years: yes, 08/29/16 If all of the above answers are "NO", then may proceed with Cephalosporin use.   . Latex Rash    Home Medications:   Home Medications:  Prior to Admission medications   Medication Sig Start Date End Date Taking? Authorizing Provider  levothyroxine (SYNTHROID, LEVOTHROID) 75 MCG tablet Take 75 mcg by mouth every morning. 05/29/16  Yes [provider]  amLODipine (NORVASC) 5 MG tablet Take 1 tablet (  5 mg total) by mouth daily. For high blood pressure Patient not taking: Reported on 12/24/2014 07/30/14   Lindell Spar I, NP  cyclobenzaprine (FLEXERIL) 10 MG tablet Take 1 tablet (10 mg total) by mouth at bedtime. Patient not taking: Reported on 08/29/2016 05/25/16   Dalia Heading, PA-C  diphenhydrAMINE (BENADRYL) 25 MG tablet Take 1 tablet (25 mg total) by mouth every 6 (six) hours as needed. 08/29/16 09/03/16  Duffy Bruce, MD  hydrOXYzine  (ATARAX/VISTARIL) 50 MG tablet Take 1 tablet (50 mg total) by mouth 3 (three) times daily as needed (anxiety). Patient not taking: Reported on 12/24/2014 07/30/14   Lindell Spar I, NP  ibuprofen (ADVIL,MOTRIN) 800 MG tablet Take 1 tablet (800 mg total) by mouth every 8 (eight) hours as needed. Patient not taking: Reported on 08/29/2016 05/25/16   Dalia Heading, PA-C  ipratropium (ATROVENT) 0.06 % nasal spray Place 2 sprays into both nostrils 4 (four) times daily. For allergies Patient not taking: Reported on 12/24/2014 07/30/14   Lindell Spar I, NP  levothyroxine (SYNTHROID, LEVOTHROID) 50 MCG tablet Take 1 tablet (50 mcg total) by mouth daily before breakfast. For low thyroid function Patient not taking: Reported on 04/18/2017 07/30/14   Lindell Spar I, NP  loratadine (CLARITIN) 10 MG tablet Take 1 tablet (10 mg total) by mouth daily. (This medicine may be purchased from over there counter at yr local pharmacy): For allergies Patient not taking: Reported on 08/29/2016 07/30/14   Lindell Spar I, NP  nicotine (NICODERM CQ - DOSED IN MG/24 HOURS) 14 mg/24hr patch Place 1 patch (14 mg total) onto the skin daily. For nicotine addiction Patient not taking: Reported on 12/24/2014 07/30/14   Lindell Spar I, NP  ranitidine (ZANTAC) 150 MG tablet Take 1 tablet (150 mg total) by mouth 2 (two) times daily. Patient not taking: Reported on 04/18/2017 08/29/16 09/03/16  Duffy Bruce, MD  traMADol (ULTRAM) 50 MG tablet Take 1 tablet (50 mg total) by mouth every 6 (six) hours as needed for severe pain. Patient not taking: Reported on 04/18/2017 05/25/16   Dalia Heading, PA-C  zolpidem (AMBIEN) 5 MG tablet Take 1 tablet (5 mg total) by mouth at bedtime. For sleep Patient not taking: Reported on 12/24/2014 07/30/14   Lindell Spar I, NP    Inpatient Medications    Scheduled Meds: . carvedilol  3.125 mg Oral BID WC  . dextromethorphan-guaiFENesin  1 tablet Oral BID  . famotidine  20 mg Oral QHS  . heparin  5,000  Units Subcutaneous Q8H  . levothyroxine  75 mcg Oral QAC breakfast  . lisinopril  2.5 mg Oral Daily  . mouth rinse  15 mL Mouth Rinse BID  . multivitamin with minerals  1 tablet Oral Daily  . nicotine  14 mg Transdermal Daily  . sodium chloride flush  3 mL Intravenous Q12H   Continuous Infusions: . sodium chloride    . ceFEPime (MAXIPIME) IV 1 g (04/25/17 0636)   PRN Meds: sodium chloride, albuterol, diphenhydrAMINE, guaiFENesin-dextromethorphan, hydrALAZINE, labetalol, phenol, sodium chloride flush  Family History    Family History  Problem Relation Age of Onset  . Hypertension Mother        Living, currently 39 years old  . Cancer Father     Social History    Social History   Socioeconomic History  . Marital status: Legally Separated    Spouse name: Not on file  . Number of children: Not on file  . Years of education: Not on file  .  Highest education level: Not on file  Social Needs  . Financial resource strain: Not on file  . Food insecurity - worry: Not on file  . Food insecurity - inability: Not on file  . Transportation needs - medical: Not on file  . Transportation needs - non-medical: Not on file  Occupational History  . Not on file  Tobacco Use  . Smoking status: Current Some Day Smoker    Packs/day: 1.00    Years: 40.00    Pack years: 40.00    Types: Cigarettes  . Smokeless tobacco: Never Used  Substance and Sexual Activity  . Alcohol use: Yes    Comment: 1+ pints of Liquor per day  . Drug use: Yes    Types: "Crack" cocaine  . Sexual activity: Not on file  Other Topics Concern  . Not on file  Social History Narrative  . Not on file     Review of Systems    General:  No chills, fever, night sweats or weight changes.  Cardiovascular:  No chest pain, edema, orthopnea, palpitations, paroxysmal nocturnal dyspnea. Positive for dyspnea on exertion.  Dermatological: No rash, lesions/masses Respiratory: Positive for cough and dyspnea. Urologic: No  hematuria, dysuria Abdominal:   No nausea, vomiting, diarrhea, bright red blood per rectum, melena, or hematemesis Neurologic:  No visual changes, wkns, changes in mental status. All other systems reviewed and are otherwise negative except as noted above.  Physical Exam/Data    Blood pressure 129/89, pulse 60, temperature 98.1 F (36.7 C), temperature source Oral, resp. rate 18, height 5\' 9"  (1.753 m), weight 225 lb 8.5 oz (102.3 kg), last menstrual period 07/21/2014, SpO2 100 %.  General: Pleasant African American female appearing in NAD Psych: Normal affect. Neuro: Alert and oriented X 3. Moves all extremities spontaneously. HEENT: Normal  Neck: Supple without bruits or JVD. Lungs:  Resp regular and unlabored, mild expiratory wheezing along upper lung fields bilaterally. Heart: RRR no s3, s4, or murmurs. Abdomen: Soft, non-tender, non-distended, BS + x 4.  Extremities: No clubbing, cyanosis or lower extremity edema. DP/PT/Radials 2+ and equal bilaterally.   EKG:  None Available for review this admission in Epic or in Shadow Chart. Will order. Telemetry:  Telemetry was personally reviewed and demonstrates: NSR, HR in mid-50's to 60's.    Labs/Studies     Relevant CV Studies:  Echocardiogram: 04/20/2017 Study Conclusions  - Left ventricle: The cavity size was normal. There was mild   concentric hypertrophy. Systolic function was moderately reduced.   The estimated ejection fraction was in the range of 35% to 40%.   Diffuse hypokinesis. Doppler parameters are consistent with high   ventricular filling pressure. - Aortic valve: Transvalvular velocity was within the normal range.   There was no stenosis. There was no regurgitation. - Mitral valve: Transvalvular velocity was within the normal range.   There was no evidence for stenosis. There was mild regurgitation. - Left atrium: The atrium was severely dilated. - Right ventricle: The cavity size was normal. Wall thickness  was   normal. Systolic function was normal. - Tricuspid valve: There was no regurgitation. - Pericardium, extracardiac: A trivial pericardial effusion was   identified.  Laboratory Data:  Chemistry Recent Labs  Lab 04/23/17 0346 04/24/17 0933 04/25/17 0417  NA 144 139 137  K 4.2 4.5 4.3  CL 106 99* 100*  CO2 27 26 26   GLUCOSE 100* 133* 115*  BUN 27* 28* 35*  CREATININE 1.07* 1.18* 1.14*  CALCIUM 9.5 10.1 10.0  GFRNONAA 55* 49* 51*  GFRAA >60 57* 59*  ANIONGAP 11 14 11     Recent Labs  Lab 04/19/17 0451 04/19/17 2033  PROT 7.1 7.8  ALBUMIN 3.6 3.6  AST 23 94*  ALT 16 46  ALKPHOS 63 89  BILITOT 0.6 0.5   Hematology Recent Labs  Lab 04/21/17 0222 04/22/17 0353 04/23/17 0346  WBC 4.1 4.4 5.0  RBC 3.41* 3.72* 4.15  HGB 9.1* 9.8* 11.2*  HCT 28.0* 30.5* 34.7*  MCV 82.1 82.0 83.6  MCH 26.7 26.3 27.0  MCHC 32.5 32.1 32.3  RDW 15.6* 16.0* 16.2*  PLT 164 186 223   Cardiac Enzymes Recent Labs  Lab 04/23/17 0346 04/24/17 0506  TROPONINI 0.06* 0.04*   No results for input(s): TROPIPOC in the last 168 hours.  BNP Recent Labs  Lab 04/19/17 2357  BNP 379.4*    DDimer  No results for input(s): DDIMER in the last 168 hours.  Radiology/Studies:  Dg Chest 2 View  Result Date: 04/24/2017 CLINICAL DATA:  Shortness of breath.  Dry cough. EXAM: CHEST  2 VIEW COMPARISON:  04/23/2017 FINDINGS: Enlarged cardiac silhouette. Calcific atherosclerotic disease of the aorta. There is no evidence of focal airspace consolidation, pleural effusion or pneumothorax. Osseous structures are without acute abnormality. Soft tissues are grossly normal. IMPRESSION: Enlarged cardiac silhouette. Calcific atherosclerotic disease of the aorta. Electronically Signed   By: Fidela Salisbury M.D.   On: 04/24/2017 14:56   Dg Chest Port 1 View  Result Date: 04/23/2017 CLINICAL DATA:  Acute respiratory failure with hypoxia EXAM: PORTABLE CHEST 1 VIEW COMPARISON:  04/22/2017 FINDINGS:  Endotracheal tube and NG tube have been removed since the prior study. Marked cardiac enlargement unchanged. Pulmonary vascular congestion with mild edema has improved. Bibasilar atelectasis has improved. No effusion. IMPRESSION: Endotracheal tube and NG tube removed. Improvement in heart failure and bibasilar atelectasis since the prior study. Electronically Signed   By: Franchot Gallo M.D.   On: 04/23/2017 06:41   Dg Chest Port 1 View  Result Date: 04/22/2017 CLINICAL DATA:  Respiratory failure.  Endotracheal tube EXAM: PORTABLE CHEST 1 VIEW COMPARISON:  Three days ago FINDINGS: Endotracheal tube tip at the clavicular heads. An orogastric tube reaches the stomach. Cardiopericardial enlargement that is stable. Worsening aeration at the right base where the diaphragm is less well defined. Bilateral streaky perihilar opacity. IMPRESSION: 1. Stable positioning of endotracheal and orogastric tubes. 2. Influenza with perihilar atelectasis and bronchopneumonia. Mild worsening aeration at the right base. 3. Cardiomegaly. Electronically Signed   By: Monte Fantasia M.D.   On: 04/22/2017 06:37     Assessment & Plan    1. Acute on Chronic Systolic CHF - admitted with Influenza A and PNA, requiring intubation for acute hypoxic respiratory failure and now extubated. - echo this admission shows a reduced EF of 35-40%, diffuse HK, mild MR, and a trivial pericardial effusion. She has no prior cardiac history of CAD, CHF, or cardiac arrhythmias. She has been diuresed with IV Lasix and is -5.3L with weight down from 240 lbs on admission to 225 lbs on 11/10. She does not appear volume overloaded by physical examination.  - the etiology of her cardiomyopathy is unclear but the differential includes viral cardiomyopathy vs. alcohol-induced vs. ischemic. She denies any recent anginal symptoms. Will obtain an EKG as no tracings from this admission are available for review in Epic. Will discuss further with Dr. Debara Pickett, but  would favor medical management at this time in the setting of her recent illness  and medical noncompliant ecce. She has been started on Coreg 3.125mg  BID along with Lisinopril (reports a dry cough since initiation of this, therefore will switch to Losartan). BB therapy would not be favored in the setting of Cocaine use but she last used one month ago and is determined to quit. If she does relapse, this would need to be discontinued. Consider repeat ehocardiogram in the outpatient setting and if EF remains reduced, will need to pursue ischemic evaluation at that time.   2. HTN - BP has been at 121/87 - 129/89 within the past 24 hours.  - continue Coreg 3.125mg  BID. Will switch Lisinopril to Losartan in the setting of her worsening dry cough since initiation of the ACE-I.   3. Influenza A/ PNA - completed course of Tamiflu. Remains on Cefepime.  - per admitting team.   4. Substance Use - reports a history of cocaine use (last used 1 month prior), alcohol use (consumes 1+ pints of liquor per day) and tobacco use. She reports being determined to quit and feels like her current hospitalization was a "wake-up call" to make better life choices. Continued cessation advised.     Signed, Erma Heritage, PA-C 04/25/2017, 10:04 AM Pager: [phone]

## 2017-05-17 ENCOUNTER — Encounter (HOSPITAL_COMMUNITY): Payer: Self-pay | Admitting: Emergency Medicine

## 2017-05-17 ENCOUNTER — Emergency Department (HOSPITAL_COMMUNITY)
Admission: EM | Admit: 2017-05-17 | Discharge: 2017-05-17 | Disposition: A | Discharge status: Home / Self Care | Payer: 59 | Attending: Emergency Medicine | Admitting: Emergency Medicine

## 2017-05-17 ENCOUNTER — Emergency Department (HOSPITAL_COMMUNITY): Payer: 59

## 2017-05-17 ENCOUNTER — Other Ambulatory Visit: Payer: Self-pay

## 2017-05-17 DIAGNOSIS — R079 Chest pain, unspecified: Secondary | ICD-10-CM | POA: Diagnosis present

## 2017-05-17 DIAGNOSIS — K219 Gastro-esophageal reflux disease without esophagitis: Secondary | ICD-10-CM | POA: Insufficient documentation

## 2017-05-17 DIAGNOSIS — I5022 Chronic systolic (congestive) heart failure: Secondary | ICD-10-CM | POA: Diagnosis not present

## 2017-05-17 DIAGNOSIS — I110 Hypertensive heart disease with heart failure: Secondary | ICD-10-CM | POA: Diagnosis not present

## 2017-05-17 DIAGNOSIS — F17210 Nicotine dependence, cigarettes, uncomplicated: Secondary | ICD-10-CM | POA: Diagnosis not present

## 2017-05-17 DIAGNOSIS — E039 Hypothyroidism, unspecified: Secondary | ICD-10-CM | POA: Insufficient documentation

## 2017-05-17 DIAGNOSIS — Z79899 Other long term (current) drug therapy: Secondary | ICD-10-CM | POA: Diagnosis not present

## 2017-05-17 LAB — BASIC METABOLIC PANEL
ANION GAP: 9 (ref 5–15)
BUN: 16 mg/dL (ref 6–20)
CHLORIDE: 102 mmol/L (ref 101–111)
CO2: 27 mmol/L (ref 22–32)
Calcium: 10 mg/dL (ref 8.9–10.3)
Creatinine, Ser: 0.96 mg/dL (ref 0.44–1.00)
GFR calc Af Amer: >60 mL/min (ref >60)
GLUCOSE: 90 mg/dL (ref 65–99)
POTASSIUM: 4.7 mmol/L (ref 3.5–5.1)
Sodium: 138 mmol/L (ref 135–145)

## 2017-05-17 LAB — D-DIMER, QUANTITATIVE (NOT AT ARMC): D DIMER QUANT: 0.85 ug{FEU}/mL — AB (ref 0.00–0.50)

## 2017-05-17 LAB — CBC
HEMATOCRIT: 36.9 % (ref 36.0–46.0)
HEMOGLOBIN: 11.9 g/dL — AB (ref 12.0–15.0)
MCH: 26.4 pg (ref 26.0–34.0)
MCHC: 32.2 g/dL (ref 30.0–36.0)
MCV: 81.8 fL (ref 78.0–100.0)
Platelets: 199 10*3/uL (ref 150–400)
RBC: 4.51 MIL/uL (ref 3.87–5.11)
RDW: 15.6 % — AB (ref 11.5–15.5)
WBC: 7.2 10*3/uL (ref 4.0–10.5)

## 2017-05-17 LAB — I-STAT TROPONIN, ED
TROPONIN I, POC: 0.03 ng/mL (ref 0.00–0.08)
Troponin i, poc: 0.03 ng/mL (ref 0.00–0.08)

## 2017-05-17 MED ORDER — GI COCKTAIL ~~LOC~~
30.0000 mL | Freq: Once | ORAL | Status: AC
  Administered 2017-05-17: 30 mL via ORAL
  Filled 2017-05-17: qty 30

## 2017-05-17 MED ORDER — IOPAMIDOL (ISOVUE-370) INJECTION 76%
INTRAVENOUS | Status: AC
  Filled 2017-05-17: qty 100

## 2017-05-17 MED ORDER — IOPAMIDOL (ISOVUE-370) INJECTION 76%
100.0000 mL | Freq: Once | INTRAVENOUS | Status: AC | PRN
  Administered 2017-05-17: 100 mL via INTRAVENOUS

## 2017-05-17 MED ORDER — FAMOTIDINE 20 MG PO TABS: 20.0000 mg | ORAL_TABLET | Freq: Two times a day (BID) | ORAL | 0 refills | Status: DC

## 2017-05-17 MED ORDER — MORPHINE SULFATE (PF) 4 MG/ML IV SOLN
4.0000 mg | Freq: Once | INTRAVENOUS | Status: AC
  Administered 2017-05-17: 4 mg via INTRAVENOUS
  Filled 2017-05-17: qty 1

## 2017-05-17 MED ORDER — ASPIRIN 81 MG PO CHEW
324.0000 mg | CHEWABLE_TABLET | Freq: Once | ORAL | Status: AC
  Administered 2017-05-17: 324 mg via ORAL
  Filled 2017-05-17: qty 4

## 2017-05-17 MED ORDER — ONDANSETRON HCL 4 MG/2ML IJ SOLN
4.0000 mg | Freq: Once | INTRAMUSCULAR | Status: AC
  Administered 2017-05-17: 4 mg via INTRAVENOUS
  Filled 2017-05-17: qty 2

## 2017-05-17 MED ORDER — FAMOTIDINE IN NACL 20-0.9 MG/50ML-% IV SOLN
20.0000 mg | Freq: Once | INTRAVENOUS | Status: AC
Start: 2017-05-17 — End: 2017-05-17
  Administered 2017-05-17: 20 mg via INTRAVENOUS
  Filled 2017-05-17: qty 50

## 2017-05-17 NOTE — ED Triage Notes (Signed)
Patient reports being recently treated for pneumonia and shortness of breath. Pt now stating she has central chest pain along with shortness of breath. Pt speaking in full sentences. Alert and ambulatory.

## 2017-05-17 NOTE — ED Notes (Signed)
Pt returned from CT scan.

## 2017-05-17 NOTE — ED Provider Notes (Signed)
[facility] DEPT Provider Note   CSN: [id number] Arrival date & time: 05/17/17  1106     History   Chief Complaint Chief Complaint  Patient presents with  . Chest Pain    HPI   Blood pressure 136/82, pulse 77, temperature 97.7 F (36.5 C), temperature source Oral, resp. rate 16, last menstrual period 07/21/2014, SpO2 99 %.  Jamie Hicks is a 61 y.o. female with past medical history significant for hypertension, hypothyroid, recently admitted for pneumonia and respiratory failure with ARDs she went to rehab after recent discharge and then transition to home.  She has been cleaning the house and cooking.  She developed a 10 out of 10 left-sided chest pain now 8 out of 10 with no associated diaphoresis, shortness of breath, nausea, vomiting.  She states it is not exacerbated by movement, palpation.  She states it may be exacerbated by deep breathing, she denies any increasing peripheral edema, orthopnea, PND.     Past Medical History:  Diagnosis Date  . Back pain   . Hypertension   . Thyroid disease     Patient Active Problem List   Diagnosis Date Noted  . Acute on chronic systolic heart failure ([employer]) 04/25/2017  . Acute respiratory failure with hypoxia ([place])   . ARDS (adult respiratory distress syndrome) ([place])   . Acute respiratory distress   . Community acquired pneumonia of left upper lobe of lung ([place])   . Hypoxemia   . Influenza A 04/18/2017  . Left upper lobe pneumonia ([place]) 04/18/2017  . Obesity, Class III, BMI 40-49.9 (morbid obesity) ([place]) 04/18/2017  . Thyroid activity decreased   . Cocaine dependence with cocaine-induced mood disorder ([place]) 07/22/2014  . Major depressive disorder, recurrent, severe without psychotic features ([place])   . Substance induced mood disorder ([place]) 07/19/2014  . Suicidal ideation   . Homicidal ideation   . Osteoarthritis 09/24/2012  . Prediabetes 09/24/2012  . Dyslipidemia 09/24/2012  . Vitamin D  insufficiency 09/24/2012  . Hypothyroidism 09/22/2012  . Bilateral chronic knee pain 09/22/2012  . Smoker 09/22/2012  . Essential hypertension 09/22/2012    Past Surgical History:  Procedure Laterality Date  . HERNIA REPAIR    . knee arthorscopy      OB History    No data available       Home Medications    Prior to Admission medications   Medication Sig Start Date End Date Taking? Authorizing Provider  acetaminophen (TYLENOL) 325 MG tablet Take 325 mg by mouth daily as needed for moderate pain.   Yes [provider]  carvedilol (COREG) 3.125 MG tablet Take 1 tablet (3.125 mg total) 2 (two) times daily with a meal by mouth. 04/25/17  Yes Thurnell Lose, MD  cyclobenzaprine (FLEXERIL) 10 MG tablet Take 1 tablet (10 mg total) by mouth at bedtime. 05/25/16  Yes Lawyer, Harrell Gave, PA-C  furosemide (LASIX) 20 MG tablet Take 1 tablet (20 mg total) daily by mouth. 04/25/17 04/25/18 Yes Thurnell Lose, MD  hydroxypropyl methylcellulose / hypromellose (ISOPTO TEARS / GONIOVISC) 2.5 % ophthalmic solution Place 1 drop into both eyes 2 (two) times daily.   Yes [provider]  ipratropium (ATROVENT) 0.06 % nasal spray Place 2 sprays into both nostrils 4 (four) times daily. For allergies 07/30/14  Yes Nwoko, Herbert Pun I, NP  levothyroxine (SYNTHROID, LEVOTHROID) 75 MCG tablet Take 75 mcg by mouth every morning. 05/29/16  Yes [provider]  loratadine (CLARITIN) 10 MG tablet Take 1 tablet (  10 mg total) by mouth daily. (This medicine may be purchased from over there counter at yr local pharmacy): For allergies 07/30/14  Yes Lindell Spar I, NP  losartan (COZAAR) 25 MG tablet Take 1 tablet (25 mg total) daily by mouth. 04/26/17  Yes Thurnell Lose, MD  ranitidine (ZANTAC) 150 MG tablet Take 150 mg by mouth 2 (two) times daily. 05/07/17  Yes [provider]  diphenhydrAMINE (BENADRYL) 25 MG tablet Take 1 tablet (25 mg total) by mouth every 6 (six) hours as  needed. 08/29/16 09/03/16  Duffy Bruce, MD  famotidine (PEPCID) 20 MG tablet Take 1 tablet (20 mg total) by mouth 2 (two) times daily. 05/17/17   Tiah Heckel, Elmyra Ricks, PA-C  nicotine (NICODERM CQ - DOSED IN MG/24 HOURS) 14 mg/24hr patch Place 1 patch (14 mg total) onto the skin daily. For nicotine addiction Patient not taking: Reported on 12/24/2014 07/30/14   Lindell Spar I, NP  ranitidine (ZANTAC) 150 MG tablet Take 1 tablet (150 mg total) by mouth 2 (two) times daily. Patient not taking: Reported on 04/18/2017 08/29/16 09/03/16  Duffy Bruce, MD    Family History Family History  Problem Relation Age of Onset  . Hypertension Mother        Living, currently 72 years old  . Cancer Father     Social History Social History   Tobacco Use  . Smoking status: Current Some Day Smoker    Packs/day: 1.00    Years: 40.00    Pack years: 40.00    Types: Cigarettes  . Smokeless tobacco: Never Used  Substance Use Topics  . Alcohol use: Yes    Comment: 1+ pints of Liquor per day  . Drug use: Yes    Types: "Crack" cocaine     Allergies   Amoxicillin; Lisinopril; and Latex   Review of Systems Review of Systems  A complete review of systems was obtained and all systems are negative except as noted in the HPI and PMH.   Physical Exam Updated Vital Signs BP 122/81   Pulse 78   Temp 97.7 F (36.5 C) (Oral)   Resp 20   LMP 07/21/2014 Comment: current  SpO2 94%   Physical Exam  Constitutional: She is oriented to person, place, and time. She appears well-developed and well-nourished. No distress.  HENT:  Head: Normocephalic and atraumatic.  Mouth/Throat: Oropharynx is clear and moist.  Eyes: Conjunctivae and EOM are normal. Pupils are equal, round, and reactive to light.  Neck: Normal range of motion. No JVD present. No tracheal deviation present.  Cardiovascular: Normal rate, regular rhythm and intact distal pulses.  Radial pulse equal bilaterally  Pulmonary/Chest: Effort normal  and breath sounds normal. No stridor. No respiratory distress. She has no wheezes. She has no rales. She exhibits no tenderness.  Abdominal: Soft. She exhibits no distension and no mass. There is no tenderness. There is no rebound and no guarding.  Musculoskeletal: Normal range of motion. She exhibits no edema or tenderness.  No calf asymmetry, superficial collaterals, palpable cords, edema, Homans sign negative bilaterally.    Neurological: She is alert and oriented to person, place, and time.  Skin: Skin is warm. She is not diaphoretic.  Psychiatric: She has a normal mood and affect.  Nursing note and vitals reviewed.    ED Treatments / Results  Labs (all labs ordered are listed, but only abnormal results are displayed) Labs Reviewed  CBC - Abnormal; Notable for the following components:      Result Value  Hemoglobin 11.9 (*)    RDW 15.6 (*)    All other components within normal limits  D-DIMER, QUANTITATIVE (NOT AT [facility]) - Abnormal; Notable for the following components:   D-Dimer, Quant 0.85 (*)    All other components within normal limits  BASIC METABOLIC PANEL  I-STAT TROPONIN, ED  I-STAT TROPONIN, ED    EKG  EKG Interpretation  Date/Time:  Tuesday May 17 2017 18:23:44 EST Ventricular Rate:  72 PR Interval:    QRS Duration: 109 QT Interval:  465 QTC Calculation: 509 R Axis:   39 Text Interpretation:  Sinus rhythm Left atrial enlargement Left ventricular hypertrophy Borderline prolonged QT interval Confirmed by Davonna Belling [phone]) on 05/17/2017 6:30:21 PM       Radiology Dg Chest 2 View  Result Date: 05/17/2017 CLINICAL DATA:  Chest pain and shortness of breath. EXAM: CHEST  2 VIEW COMPARISON:  04/24/2017, 04/23/2017 and 04/19/2017 and 05/25/2016 FINDINGS: Chronic cardiomegaly. Pulmonary vascularity is normal. No infiltrates or effusions. Tiny area of scarring in the left midzone. Minimal atelectasis at the right lung base laterally. Aortic  atherosclerosis.  No acute bone abnormality. IMPRESSION: 1. Chronic cardiomegaly. 2. Minimal atelectasis at the right lung base laterally. 3.  Aortic Atherosclerosis (ICD10-I70.0). Electronically Signed   By: Lorriane Shire M.D.   On: 05/17/2017 12:28   Ct Angio Chest Pe W And/or Wo Contrast  Result Date: 05/17/2017 CLINICAL DATA:  Central chest pain and shortness of breath, recently treated for pneumonia, elevated D-dimer, history hypertension EXAM: CT ANGIOGRAPHY CHEST WITH CONTRAST TECHNIQUE: Multidetector CT imaging of the chest was performed using the standard protocol during bolus administration of intravenous contrast. Multiplanar CT image reconstructions and MIPs were obtained to evaluate the vascular anatomy. CONTRAST:  112mL ISOVUE-370 IOPAMIDOL (ISOVUE-370) INJECTION 76% IV COMPARISON:  04/18/2017 FINDINGS: Cardiovascular: Atherosclerotic calcifications aorta, proximal great vessels and coronary arteries. Aorta normal caliber without gross evidence of aneurysm or dissection. Pulmonary arteries adequately opacified and patent. No evidence of pulmonary embolism. No pericardial effusion. Diffuse dilatation of cardiac chambers. Mediastinum/Nodes: Esophagus appears mildly distended by fluid and air throughout its length, could represent reflux or incomplete clearance. Base of cervical region unremarkable. No definite thoracic adenopathy. Lungs/Pleura: Dependent atelectasis in the posterior lungs. Subsegmental atelectasis at base of LEFT lower lobe. Remaining lungs clear. No acute infiltrate, pleural effusion or pneumothorax. Upper Abdomen: Unremarkable Musculoskeletal: No acute osseous findings. Review of the MIP images confirms the above findings. IMPRESSION: No evidence of pulmonary embolism. Esophagus is mildly distended throughout its length by fluid and air, question reflux versus incomplete clearance of swallowed fluid/secretions. Enlargement of cardiac chambers. Scattered atelectasis. Aortic  Atherosclerosis (ICD10-I70.0). Electronically Signed   By: Lavonia Dana M.D.   On: 05/17/2017 21:24    Procedures Procedures (including critical care time)  Medications Ordered in ED Medications  iopamidol (ISOVUE-370) 76 % injection (not administered)  aspirin chewable tablet 324 mg (324 mg Oral Given 05/17/17 1850)  gi cocktail (Maalox,Lidocaine,Donnatal) (30 mLs Oral Given 05/17/17 1850)  morphine 4 MG/ML injection 4 mg (4 mg Intravenous Given 05/17/17 2022)  ondansetron (ZOFRAN) injection 4 mg (4 mg Intravenous Given 05/17/17 2023)  iopamidol (ISOVUE-370) 76 % injection 100 mL (100 mLs Intravenous Contrast Given 05/17/17 2105)  famotidine (PEPCID) IVPB 20 mg premix (0 mg Intravenous Stopped 05/17/17 2151)     Initial Impression / Assessment and Plan / ED Course  I have reviewed the triage vital signs and the nursing notes.  Pertinent labs & imaging results that were available during my  care of the patient were reviewed by me and considered in my medical decision making (see chart for details).     Vitals:   05/17/17 1123 05/17/17 1654 05/17/17 1933 05/17/17 2141  BP: (!) 146/89 136/82 (!) 179/116 122/81  Pulse: 71 77 70 78  Resp:  16 14 20   Temp: 98 F (36.7 C) 97.7 F (36.5 C)    TempSrc: Oral Oral    SpO2: 100% 99% 100% 94%    Medications  iopamidol (ISOVUE-370) 76 % injection (not administered)  aspirin chewable tablet 324 mg (324 mg Oral Given 05/17/17 1850)  gi cocktail (Maalox,Lidocaine,Donnatal) (30 mLs Oral Given 05/17/17 1850)  morphine 4 MG/ML injection 4 mg (4 mg Intravenous Given 05/17/17 2022)  ondansetron (ZOFRAN) injection 4 mg (4 mg Intravenous Given 05/17/17 2023)  iopamidol (ISOVUE-370) 76 % injection 100 mL (100 mLs Intravenous Contrast Given 05/17/17 2105)  famotidine (PEPCID) IVPB 20 mg premix (0 mg Intravenous Stopped 05/17/17 2151)    Jamie Hicks is 61 y.o. female presenting with severe nonradiating chest pain onset today after patient was doing a  lot of physical cleaning and cooking over the last several days at home.  This does not appear to be exertional, EKG with no acute findings, troponin negative, blood work reassuring, chest x-ray without acute findings.  D-dimer elevated, will CTA.  Repeat troponin negative, CTA with reflux.  She states that she does have a history of severe acid reflux and has not been taking her medication regularly.  Wrote her a prescription for Pepcid, she will follow closely with primary care.  Evaluation does not show pathology that would require ongoing emergent intervention or inpatient treatment. Pt is hemodynamically stable and mentating appropriately. Discussed findings and plan with patient/guardian, who agrees with care plan. All questions answered. Return precautions discussed and outpatient follow up given.      Final Clinical Impressions(s) / ED Diagnoses   Final diagnoses:  Gastroesophageal reflux disease, esophagitis presence not specified    ED Discharge Orders        Ordered    famotidine (PEPCID) 20 MG tablet  2 times daily     05/17/17 2132       Katelee Schupp, Charna Elizabeth 05/17/17 2238    Davonna Belling, MD 05/17/17 2340

## 2017-05-17 NOTE — ED Notes (Signed)
Bed: AE49 Expected date:  Expected time:  Means of arrival:  Comments: TRI

## 2017-05-17 NOTE — Discharge Instructions (Signed)
Please follow with your primary care doctor in the next 2 days for a check-up. They must obtain records for further management.  ° °Do not hesitate to return to the Emergency Department for any new, worsening or concerning symptoms.  ° °

## 2017-05-25 ENCOUNTER — Ambulatory Visit: Payer: Self-pay | Admitting: Internal Medicine

## 2017-05-31 ENCOUNTER — Encounter: Payer: Self-pay | Admitting: Internal Medicine

## 2017-05-31 ENCOUNTER — Ambulatory Visit (INDEPENDENT_AMBULATORY_CARE_PROVIDER_SITE_OTHER): Payer: 59 | Admitting: Internal Medicine

## 2017-05-31 VITALS — BP 146/88 | HR 66 | Ht 69.0 in | Wt 238.6 lb

## 2017-05-31 DIAGNOSIS — I10 Essential (primary) hypertension: Secondary | ICD-10-CM | POA: Diagnosis not present

## 2017-05-31 DIAGNOSIS — F1910 Other psychoactive substance abuse, uncomplicated: Secondary | ICD-10-CM | POA: Diagnosis not present

## 2017-05-31 DIAGNOSIS — I5021 Acute systolic (congestive) heart failure: Secondary | ICD-10-CM

## 2017-05-31 MED ORDER — FAMOTIDINE 20 MG PO TABS: 20.0000 mg | ORAL_TABLET | Freq: Two times a day (BID) | ORAL | 3 refills | Status: DC

## 2017-05-31 MED ORDER — LOSARTAN POTASSIUM 50 MG PO TABS: 50.0000 mg | ORAL_TABLET | Freq: Every day | ORAL | 3 refills | Status: DC

## 2017-05-31 NOTE — Progress Notes (Signed)
OFFICE NOTE  Chief Complaint:  Hospital follow-up, breathing is improved  Primary Care Physician: Patient, No Pcp Per  HPI:  Jamie Hicks is a 61 y.o. female with a past medial history significant for etoh and cocaine abuse, HTN and recent admission for Influenza A pneumonia - this required intubation and mechanical ventilation and was complicated by the development of acute systolic congestive heart failure with LVEF 35-40%.  She was placed on appropriate heart failure medications and discharged.  She returns today for follow-up.  She reports improvement in her breathing since discharge.  She was recently in the hospital for chest discomfort but was diagnosed with reflux and her symptoms improved on famotidine.  She currently denies any chest pain.  She says she is completely stopped alcohol and tobacco abuse as well as illicit drugs.  She says she wants to lead a healthier cleaner lifestyle.  Her only complaints are for some nasal congestion related to her apartment.  She says it is an old building and she is looking at moving to a new apartment starting in January.  She reports compliance with her medications although has not started aspirin.  Blood pressure was elevated today 146/88 which did not change after 10 minutes with a repeat blood pressure check.   PMHx:  Past Medical History:  Diagnosis Date  . Back pain   . Hypertension   . Thyroid disease     Past Surgical History:  Procedure Laterality Date  . HERNIA REPAIR    . knee arthorscopy      FAMHx:  Family History  Problem Relation Age of Onset  . Hypertension Mother        Living, currently 15 years old  . Cancer Father     SOCHx:   reports that she quit smoking about 4 weeks ago. Her smoking use included cigarettes. She has a 40.00 pack-year smoking history. she has never used smokeless tobacco. She reports that she drinks alcohol. She reports that she uses drugs. Drug: "Crack" cocaine.  ALLERGIES:    Allergies  Allergen Reactions  . Amoxicillin     Has patient had a PCN reaction causing immediate rash, facial/tongue/throat swelling, SOB or lightheadedness with hypotension: yes Has patient had a PCN reaction causing severe rash involving mucus membranes or skin necrosis: no Has patient had a PCN reaction that required hospitalization: yes  Has patient had a PCN reaction occurring within the last 10 years: yes, 08/29/16 If all of the above answers are "NO", then may proceed with Cephalosporin use.   Marland Kitchen Lisinopril Cough  . Latex Rash    ROS: Pertinent items noted in HPI and remainder of comprehensive ROS otherwise negative.  HOME MEDS: Current Outpatient Medications on File Prior to Visit  Medication Sig Dispense Refill  . acetaminophen (TYLENOL) 325 MG tablet Take 325 mg by mouth daily as needed for moderate pain.    Marland Kitchen aspirin EC 81 MG tablet Take 81 mg by mouth daily.    . carvedilol (COREG) 3.125 MG tablet Take 1 tablet (3.125 mg total) 2 (two) times daily with a meal by mouth.    . cyclobenzaprine (FLEXERIL) 10 MG tablet Take 1 tablet (10 mg total) by mouth at bedtime. 10 tablet 0  . diphenhydrAMINE (BENADRYL) 25 MG tablet Take 1 tablet (25 mg total) by mouth every 6 (six) hours as needed. 20 tablet 0  . furosemide (LASIX) 20 MG tablet Take 1 tablet (20 mg total) daily by mouth. 30 tablet 11  .  hydroxypropyl methylcellulose / hypromellose (ISOPTO TEARS / GONIOVISC) 2.5 % ophthalmic solution Place 1 drop into both eyes 2 (two) times daily.    Marland Kitchen ipratropium (ATROVENT) 0.06 % nasal spray Place 2 sprays into both nostrils 4 (four) times daily. For allergies 15 mL 1  . levothyroxine (SYNTHROID, LEVOTHROID) 75 MCG tablet Take 75 mcg by mouth every morning.  2  . loratadine (CLARITIN) 10 MG tablet Take 1 tablet (10 mg total) by mouth daily. (This medicine may be purchased from over there counter at yr local pharmacy): For allergies     No current facility-administered medications on file  prior to visit.     LABS/IMAGING: No results found for this or any previous visit (from the past 48 hour(s)). No results found.  LIPID PANEL:    Component Value Date/Time   CHOL 238 (H) 09/22/2012 1333   TRIG 74 04/19/2017 2332   HDL 47 09/22/2012 1333   CHOLHDL 5.1 09/22/2012 1333   VLDL 47 (H) 09/22/2012 1333   LDLCALC 144 (H) 09/22/2012 1333     WEIGHTS: Wt Readings from Last 3 Encounters:  05/31/17 238 lb 9.6 oz (108.2 kg)  04/23/17 225 lb 8.5 oz (102.3 kg)  08/29/16 249 lb (112.9 kg)    VITALS: BP (!) 146/88   Pulse 66   Ht 5\' 9"  (1.753 m)   Wt 238 lb 9.6 oz (108.2 kg)   LMP 07/21/2014 Comment: current  SpO2 100%   BMI 35.24 kg/m   EXAM: General appearance: alert, no distress and moderately obese Neck: no carotid bruit, no JVD and thyroid not enlarged, symmetric, no tenderness/mass/nodules Lungs: clear to auscultation bilaterally Heart: regular rate and rhythm, S1, S2 normal, no murmur, click, rub or gallop Abdomen: soft, non-tender; bowel sounds normal; no masses,  no organomegaly Extremities: extremities normal, atraumatic, no cyanosis or edema Pulses: 2+ and symmetric Skin: Skin color, texture, turgor normal. No rashes or lesions Neurologic: Grossly normal Psych: Pleasant  EKG: Deferred  ASSESSMENT: 1. Acute systolic congestive heart failure-LVEF 35-40% (2018) 2. Recent influenza A pneumonia 3. History of polysubstance abuse 4. Hypertension  PLAN: 1.   Jamie Hicks returns today for follow-up.  She has no acute heart failure symptoms.  He seems to have recovered from recent influenza A pneumonia.  She says she is given upper polysubstance abuse and is committed to living a cleaner lifestyle.  Blood pressure remains somewhat elevated I think there is room to increase her losartan to 50 mg daily.  She has both ranitidine and famotidine on her medicine list, and I would advise we discontinue the ranitidine.  We will also provide samples of low-dose  aspirin and I encouraged her to start taking that on a daily basis.  Plan follow-up with me in 3 months.  We may consider repeat echo at that time.  Pixie Casino, MD, [facility], [place] Director of the [facility] Attending Cardiologist  Direct Dial: [phone]  Fax: [phone]  Website:  [email or web address] 05/31/2017, 10:43 AM

## 2017-05-31 NOTE — Patient Instructions (Signed)
Your physician has recommended you make the following change in your medication:  -- START aspirin 81mg  once daily -- STOP ranitidine (zantac) -- INCREASE losartan to 50mg  daily  Your physician recommends that you schedule a follow-up appointment in: [place] with Dr. Debara Pickett.

## 2017-06-05 ENCOUNTER — Encounter (HOSPITAL_COMMUNITY): Payer: Self-pay | Admitting: Emergency Medicine

## 2017-06-05 ENCOUNTER — Ambulatory Visit (HOSPITAL_COMMUNITY)
Admission: EM | Admit: 2017-06-05 | Discharge: 2017-06-05 | Disposition: A | Discharge status: Home / Self Care | Payer: 59 | Attending: Family Medicine | Admitting: Family Medicine

## 2017-06-05 ENCOUNTER — Other Ambulatory Visit: Payer: Self-pay

## 2017-06-05 DIAGNOSIS — M25562 Pain in left knee: Secondary | ICD-10-CM

## 2017-06-05 DIAGNOSIS — M25561 Pain in right knee: Secondary | ICD-10-CM | POA: Diagnosis not present

## 2017-06-05 DIAGNOSIS — G8929 Other chronic pain: Secondary | ICD-10-CM

## 2017-06-05 DIAGNOSIS — Z760 Encounter for issue of repeat prescription: Secondary | ICD-10-CM

## 2017-06-05 MED ORDER — CYCLOBENZAPRINE HCL 10 MG PO TABS: 10.0000 mg | ORAL_TABLET | Freq: Every day | ORAL | 0 refills | Status: DC

## 2017-06-05 MED ORDER — LORATADINE 10 MG PO TABS: 10.0000 mg | ORAL_TABLET | Freq: Every day | ORAL | 0 refills | Status: DC

## 2017-06-05 MED ORDER — IPRATROPIUM BROMIDE 0.06 % NA SOLN: 2.0000 | Freq: Four times a day (QID) | NASAL | 0 refills | Status: AC

## 2017-06-05 MED ORDER — CARVEDILOL 3.125 MG PO TABS: 3.1250 mg | ORAL_TABLET | Freq: Two times a day (BID) | ORAL | 0 refills | Status: DC

## 2017-06-05 MED ORDER — LOSARTAN POTASSIUM 50 MG PO TABS: 50.0000 mg | ORAL_TABLET | Freq: Every day | ORAL | 0 refills | Status: DC

## 2017-06-05 MED ORDER — LEVOTHYROXINE SODIUM 75 MCG PO TABS: 75.0000 ug | ORAL_TABLET | Freq: Every morning | ORAL | 0 refills | Status: DC

## 2017-06-05 MED ORDER — FAMOTIDINE 20 MG PO TABS: 20.0000 mg | ORAL_TABLET | Freq: Two times a day (BID) | ORAL | 0 refills | Status: DC

## 2017-06-05 MED ORDER — MELOXICAM 7.5 MG PO TABS: 7.5000 mg | ORAL_TABLET | Freq: Every day | ORAL | 0 refills | Status: DC

## 2017-06-05 MED ORDER — FUROSEMIDE 20 MG PO TABS: 20.0000 mg | ORAL_TABLET | Freq: Every day | ORAL | 0 refills | Status: DC

## 2017-06-05 MED ORDER — IPRATROPIUM BROMIDE 0.06 % NA SOLN: 2.0000 | Freq: Four times a day (QID) | NASAL | 0 refills | Status: DC

## 2017-06-05 NOTE — ED Triage Notes (Signed)
Pt c/o bilateral chronic knee pain,. Pt also states shes out of her medicines and needs refills. Pt needs info for a primary doctor.

## 2017-06-05 NOTE — ED Provider Notes (Signed)
[facility]    CSN: [id number] Arrival date & time: 06/05/17  1211     History   Chief Complaint Chief Complaint  Patient presents with  . Medication Refill  . Knee Pain    HPI Jamie Hicks is a 61 y.o. female.   61 year old female with history of thyroid disease, hypertension, polysubstance abuse, CHF, bilateral chronic knee pain comes in for treatment of knee pain and medication refill.  Patient states that she was admitted due to pneumonia and flu back in November, and is running out of her medications that she got then.  She states she saw her cardiologist recently, but was not able to get refills.  She is currently in between PCP and is actively looking for one.  She has been taking her medications as directed.  States chronic knee pain has been worse recently, has not taken anything for it.  She endorses mild swelling without erythema, increased warmth, fever.  States pain worse from sitting to standing motion.  She has a history of knee surgeries to bilateral knees.  Denies new injury.      Past Medical History:  Diagnosis Date  . Back pain   . Hypertension   . Thyroid disease     Patient Active Problem List   Diagnosis Date Noted  . Polysubstance abuse ([place]) 05/31/2017  . Acute systolic (congestive) heart failure ([place]) 04/25/2017  . Acute respiratory failure with hypoxia ([place])   . ARDS (adult respiratory distress syndrome) ([place])   . Acute respiratory distress   . Community acquired pneumonia of left upper lobe of lung ([place])   . Hypoxemia   . Influenza A 04/18/2017  . Left upper lobe pneumonia ([place]) 04/18/2017  . Obesity, Class III, BMI 40-49.9 (morbid obesity) ([place]) 04/18/2017  . Thyroid activity decreased   . Cocaine dependence with cocaine-induced mood disorder ([place]) 07/22/2014  . Major depressive disorder, recurrent, severe without psychotic features ([place])   . Substance induced mood disorder ([place]) 07/19/2014  . Suicidal ideation   .  Homicidal ideation   . Osteoarthritis 09/24/2012  . Prediabetes 09/24/2012  . Dyslipidemia 09/24/2012  . Vitamin D insufficiency 09/24/2012  . Hypothyroidism 09/22/2012  . Bilateral chronic knee pain 09/22/2012  . Smoker 09/22/2012  . Essential hypertension 09/22/2012    Past Surgical History:  Procedure Laterality Date  . HERNIA REPAIR    . knee arthorscopy      OB History    No data available       Home Medications    Prior to Admission medications   Medication Sig Start Date End Date Taking? Authorizing Provider  acetaminophen (TYLENOL) 325 MG tablet Take 325 mg by mouth daily as needed for moderate pain.   Yes [provider]  aspirin EC 81 MG tablet Take 81 mg by mouth daily.   Yes [provider]  diphenhydrAMINE (BENADRYL) 25 MG tablet Take 1 tablet (25 mg total) by mouth every 6 (six) hours as needed. 08/29/16  Yes Duffy Bruce, MD  hydroxypropyl methylcellulose / hypromellose (ISOPTO TEARS / GONIOVISC) 2.5 % ophthalmic solution Place 1 drop into both eyes 2 (two) times daily.   Yes [provider]  carvedilol (COREG) 3.125 MG tablet Take 1 tablet (3.125 mg total) by mouth 2 (two) times daily with a meal. 06/05/17 07/05/17  Tasia Catchings, Amy V, PA-C  cyclobenzaprine (FLEXERIL) 10 MG tablet Take 1 tablet (10 mg total) by mouth at bedtime. 06/05/17   Tasia Catchings, Amy V, PA-C  famotidine (PEPCID)  20 MG tablet Take 1 tablet (20 mg total) by mouth 2 (two) times daily. 06/05/17   Tasia Catchings, Amy V, PA-C  furosemide (LASIX) 20 MG tablet Take 1 tablet (20 mg total) by mouth daily. 06/05/17 07/05/17  Tasia Catchings, Amy V, PA-C  ipratropium (ATROVENT) 0.06 % nasal spray Place 2 sprays into both nostrils 4 (four) times daily. For allergies 06/05/17   Ok Edwards, PA-C  levothyroxine (SYNTHROID, LEVOTHROID) 75 MCG tablet Take 1 tablet (75 mcg total) by mouth every morning. 06/05/17 07/05/17  Ok Edwards, PA-C  loratadine (CLARITIN) 10 MG tablet Take 1 tablet (10 mg total) by mouth daily. (This  medicine may be purchased from over there counter at yr local pharmacy): For allergies 06/05/17 07/05/17  Ok Edwards, PA-C  losartan (COZAAR) 50 MG tablet Take 1 tablet (50 mg total) by mouth daily. 06/05/17   Tasia Catchings, Amy V, PA-C  meloxicam (MOBIC) 7.5 MG tablet Take 1 tablet (7.5 mg total) by mouth daily. 06/05/17   Ok Edwards, PA-C    Family History Family History  Problem Relation Age of Onset  . Hypertension Mother        Living, currently 14 years old  . Cancer Father     Social History Social History   Tobacco Use  . Smoking status: Former Smoker    Packs/day: 1.00    Years: 40.00    Pack years: 40.00    Types: Cigarettes    Last attempt to quit: 05/01/2017    Years since quitting: 0.0  . Smokeless tobacco: Never Used  Substance Use Topics  . Alcohol use: Yes    Comment: 1+ pints of Liquor per day  . Drug use: Yes    Types: "Crack" cocaine     Allergies   Amoxicillin; Lisinopril; and Latex   Review of Systems Review of Systems  Reason unable to perform ROS: See HPI as above.     Physical Exam Triage Vital Signs ED Triage Vitals  Enc Vitals Group     BP 06/05/17 1258 (!) 161/104     Pulse Rate 06/05/17 1258 69     Resp 06/05/17 1258 16     Temp 06/05/17 1258 97.6 F (36.4 C)     Temp src --      SpO2 06/05/17 1258 100 %     Weight --      Height --      Head Circumference --      Peak Flow --      Pain Score 06/05/17 1259 8     Pain Loc --      Pain Edu? --      Excl. in [place]? --    No data found.  Updated Vital Signs BP (!) 161/104   Pulse 69   Temp 97.6 F (36.4 C)   Resp 16   LMP 07/21/2014 Comment: current  SpO2 100%   Physical Exam  Constitutional: She is oriented to person, place, and time. She appears well-developed and well-nourished. No distress.  HENT:  Head: Normocephalic and atraumatic.  Eyes: Conjunctivae are normal. Pupils are equal, round, and reactive to light.  Cardiovascular: Normal rate, regular rhythm and normal heart  sounds. Exam reveals no gallop and no friction rub.  No murmur heard. Pulmonary/Chest: Effort normal and breath sounds normal. She has no wheezes. She has no rales.  Musculoskeletal:  No obvious swelling, erythema, increased warmth.  Prior surgery scars seen.  No obvious tenderness on palpation. ROM testing limited  given patient cannot balance well on one foot. Strength normal and equal bilaterally.  Sensation intact and equal bilaterally.   Neurological: She is alert and oriented to person, place, and time.  Skin: Skin is warm and dry.     UC Treatments / Results  Labs (all labs ordered are listed, but only abnormal results are displayed) Labs Reviewed - No data to display  EKG  EKG Interpretation None       Radiology No results found.  Procedures Procedures (including critical care time)  Medications Ordered in UC Medications - No data to display   Initial Impression / Assessment and Plan / UC Course  I have reviewed the triage vital signs and the nursing notes.  Pertinent labs & imaging results that were available during my care of the patient were reviewed by me and considered in my medical decision making (see chart for details).    Patient requesting 1 month refill of medications until establishment with PCP.  Seems reasonable, will refill medications.  Will start Mobic for chronic knee pain.  Patient to follow-up with PCP or orthopedics for further management as needed. Resources for primary care given.  Return precautions given.  Patient expresses understanding and agrees to plan.  Final Clinical Impressions(s) / UC Diagnoses   Final diagnoses:  Chronic pain of both knees  Medication refill    ED Discharge Orders        Ordered    carvedilol (COREG) 3.125 MG tablet  2 times daily with meals     06/05/17 1400    levothyroxine (SYNTHROID, LEVOTHROID) 75 MCG tablet   Every morning - 10a     06/05/17 1400    loratadine (CLARITIN) 10 MG tablet  Daily,   Status:   Discontinued     06/05/17 1400    furosemide (LASIX) 20 MG tablet  Daily,   Status:  Discontinued     06/05/17 1400    losartan (COZAAR) 50 MG tablet  Daily,   Status:  Discontinued     06/05/17 1400    famotidine (PEPCID) 20 MG tablet  2 times daily     06/05/17 1400    ipratropium (ATROVENT) 0.06 % nasal spray  4 times daily,   Status:  Discontinued     06/05/17 1400    cyclobenzaprine (FLEXERIL) 10 MG tablet  Daily at bedtime     06/05/17 1400    meloxicam (MOBIC) 7.5 MG tablet  Daily     06/05/17 1400    furosemide (LASIX) 20 MG tablet  Daily     06/05/17 1406    ipratropium (ATROVENT) 0.06 % nasal spray  4 times daily     06/05/17 1406    loratadine (CLARITIN) 10 MG tablet  Daily     06/05/17 1406    losartan (COZAAR) 50 MG tablet  Daily     06/05/17 1406        Ok Edwards, PA-C 06/05/17 1851

## 2017-06-05 NOTE — Discharge Instructions (Signed)
Start Mobic for chronic knee pain.  Take it with food and your heartburn medicine (Famotidine) to decrease chances of stomach upset.  Ice compress on the knees can help decrease swelling and inflammation.  Water aerobics exercise can also help with losing weight without putting more pressure on her knees.  Please follow-up with your primary care worker orthopedics for further evaluation of chronic knee pain.  I have refilled for 30-day supply of your medications.  Please follow-up with primary care for further refills.

## 2017-07-05 ENCOUNTER — Telehealth: Payer: Self-pay | Admitting: Internal Medicine

## 2017-07-05 MED ORDER — LOSARTAN POTASSIUM 50 MG PO TABS: 50.0000 mg | ORAL_TABLET | Freq: Every day | ORAL | 11 refills | Status: DC

## 2017-07-05 MED ORDER — FAMOTIDINE 20 MG PO TABS: 20.0000 mg | ORAL_TABLET | Freq: Two times a day (BID) | ORAL | 11 refills | Status: DC

## 2017-07-05 NOTE — Telephone Encounter (Signed)
Returned call to patient. Advised her that some refills had been sent to the pharmacy (Losartan and Pepcid). Advised patient that claritin was OTC and that she'd need to establish care with a PCP for refills for cyclobenzaprine. Patient verbalized understanding and agreed with plan.

## 2017-07-05 NOTE — Telephone Encounter (Signed)
°*  STAT* If patient is at the pharmacy, call can be transferred to refill team.   1. Which medications need to be refilled? (please list name of each medication and dose if known) cyclobenzaprine (FLEXERIL) 10 MG tablet famotidine (PEPCID) 20 MG tablet  loratadine (CLARITIN) 10 MG tablet  &  losartan (COZAAR) 50 MG tablet    2. Which pharmacy/location (including street and city if local pharmacy) is medication to be sent to? [facility] on  [address] [place]     [phone]  3. Do they need a 30 day or 90 day supply? [place]

## 2017-07-17 ENCOUNTER — Other Ambulatory Visit: Payer: Self-pay

## 2017-07-17 ENCOUNTER — Encounter (HOSPITAL_COMMUNITY): Payer: Self-pay | Admitting: *Deleted

## 2017-07-17 ENCOUNTER — Ambulatory Visit (HOSPITAL_COMMUNITY)
Admission: EM | Admit: 2017-07-17 | Discharge: 2017-07-17 | Disposition: A | Discharge status: Home / Self Care | Payer: 59 | Attending: Family Medicine | Admitting: Family Medicine

## 2017-07-17 ENCOUNTER — Ambulatory Visit (INDEPENDENT_AMBULATORY_CARE_PROVIDER_SITE_OTHER): Payer: 59

## 2017-07-17 DIAGNOSIS — M25561 Pain in right knee: Secondary | ICD-10-CM

## 2017-07-17 DIAGNOSIS — W19XXXA Unspecified fall, initial encounter: Secondary | ICD-10-CM | POA: Diagnosis not present

## 2017-07-17 MED ORDER — DICLOFENAC SODIUM 1 % TD GEL: 4.0000 g | Freq: Four times a day (QID) | TRANSDERMAL | 3 refills | Status: DC

## 2017-07-17 NOTE — Discharge Instructions (Signed)
You have osteoarthritis of your knee, which is chronic. You have no acute fracture by xrays. Keep icing and elevation with rest. You may use the gel on the knee up to 4times a day for pain relief and take Tylenol if needed. In your chart it says medications were sent in on 01/22, so please check with your physician tomorrow regarding your refills. FU with your Cardiologist for blood pressure and cardiac history. Thank you.

## 2017-07-17 NOTE — ED Provider Notes (Signed)
[facility]    CSN: [id number] Arrival date & time: 07/17/17  1231     History   Chief Complaint Chief Complaint  Patient presents with  . Fall  . Medication Refill    HPI Jamie Hicks is a 62 y.o. female.   Who presents with right knee pain. She was crossing the street last night and when go to the curb she reports losing her balance and landed on her right knee cap. She has been walking on it today but the pain is noticeable. Some swelling is also noted. She states that her PCP is managing her Thyroid and she is having some balancing issues. No dizziness is noted today.       Past Medical History:  Diagnosis Date  . Back pain   . Hypertension   . Thyroid disease     Patient Active Problem List   Diagnosis Date Noted  . Polysubstance abuse ([place]) 05/31/2017  . Acute systolic (congestive) heart failure ([place]) 04/25/2017  . Acute respiratory failure with hypoxia ([place])   . ARDS (adult respiratory distress syndrome) ([place])   . Acute respiratory distress   . Community acquired pneumonia of left upper lobe of lung ([place])   . Hypoxemia   . Influenza A 04/18/2017  . Left upper lobe pneumonia ([place]) 04/18/2017  . Obesity, Class III, BMI 40-49.9 (morbid obesity) ([place]) 04/18/2017  . Thyroid activity decreased   . Cocaine dependence with cocaine-induced mood disorder ([place]) 07/22/2014  . Major depressive disorder, recurrent, severe without psychotic features ([place])   . Substance induced mood disorder ([place]) 07/19/2014  . Suicidal ideation   . Homicidal ideation   . Osteoarthritis 09/24/2012  . Prediabetes 09/24/2012  . Dyslipidemia 09/24/2012  . Vitamin D insufficiency 09/24/2012  . Hypothyroidism 09/22/2012  . Bilateral chronic knee pain 09/22/2012  . Smoker 09/22/2012  . Essential hypertension 09/22/2012    Past Surgical History:  Procedure Laterality Date  . HERNIA REPAIR    . knee arthorscopy      OB History    No data available       Home  Medications    Prior to Admission medications   Medication Sig Start Date End Date Taking? Authorizing Provider  acetaminophen (TYLENOL) 325 MG tablet Take 325 mg by mouth daily as needed for moderate pain.    [provider]  aspirin EC 81 MG tablet Take 81 mg by mouth daily.    [provider]  carvedilol (COREG) 3.125 MG tablet Take 1 tablet (3.125 mg total) by mouth 2 (two) times daily with a meal. 06/05/17 07/05/17  Tasia Catchings, Amy V, PA-C  cyclobenzaprine (FLEXERIL) 10 MG tablet Take 1 tablet (10 mg total) by mouth at bedtime. 06/05/17   Tasia Catchings, Amy V, PA-C  diclofenac sodium (VOLTAREN) 1 % GEL Apply 4 g topically 4 (four) times daily. 07/17/17   Bjorn Pippin, PA-C  diphenhydrAMINE (BENADRYL) 25 MG tablet Take 1 tablet (25 mg total) by mouth every 6 (six) hours as needed. 08/29/16   Duffy Bruce, MD  famotidine (PEPCID) 20 MG tablet Take 1 tablet (20 mg total) by mouth 2 (two) times daily. 07/05/17   Hilty, Nadean Corwin, MD  furosemide (LASIX) 20 MG tablet Take 1 tablet (20 mg total) by mouth daily. 06/05/17 07/05/17  Tasia Catchings, Amy V, PA-C  hydroxypropyl methylcellulose / hypromellose (ISOPTO TEARS / GONIOVISC) 2.5 % ophthalmic solution Place 1 drop into both eyes 2 (two) times daily.    [provider]  ipratropium (  ATROVENT) 0.06 % nasal spray Place 2 sprays into both nostrils 4 (four) times daily. For allergies 06/05/17   Ok Edwards, PA-C  levothyroxine (SYNTHROID, LEVOTHROID) 75 MCG tablet Take 1 tablet (75 mcg total) by mouth every morning. 06/05/17 07/05/17  Ok Edwards, PA-C  loratadine (CLARITIN) 10 MG tablet Take 1 tablet (10 mg total) by mouth daily. (This medicine may be purchased from over there counter at yr local pharmacy): For allergies 06/05/17 07/05/17  Ok Edwards, PA-C  losartan (COZAAR) 50 MG tablet Take 1 tablet (50 mg total) by mouth daily. 07/05/17   Hilty, Nadean Corwin, MD  meloxicam (MOBIC) 7.5 MG tablet Take 1 tablet (7.5 mg total) by mouth daily. 06/05/17   Ok Edwards,  PA-C    Family History Family History  Problem Relation Age of Onset  . Hypertension Mother        Living, currently 71 years old  . Cancer Father     Social History Social History   Tobacco Use  . Smoking status: Former Smoker    Packs/day: 1.00    Years: 40.00    Pack years: 40.00    Types: Cigarettes    Last attempt to quit: 05/01/2017    Years since quitting: 0.2  . Smokeless tobacco: Never Used  Substance Use Topics  . Alcohol use: Yes    Comment: 1+ pints of Liquor per day  . Drug use: Yes    Types: "Crack" cocaine     Allergies   Amoxicillin; Lisinopril; and Latex   Review of Systems Review of Systems  All other systems reviewed and are negative.    Physical Exam Triage Vital Signs ED Triage Vitals  Enc Vitals Group     BP 07/17/17 1341 (!) 132/94     Pulse Rate 07/17/17 1341 (!) 56     Resp --      Temp 07/17/17 1341 97.8 F (36.6 C)     Temp Source 07/17/17 1341 Oral     SpO2 07/17/17 1341 99 %     Weight --      Height --      Head Circumference --      Peak Flow --      Pain Score 07/17/17 1340 8     Pain Loc --      Pain Edu? --      Excl. in [place]? --    No data found.  Updated Vital Signs BP (!) 132/94 (BP Location: Left Arm)   Pulse (!) 56   Temp 97.8 F (36.6 C) (Oral)   LMP 07/21/2014 Comment: current  SpO2 99%   Visual Acuity Right Eye Distance:   Left Eye Distance:   Bilateral Distance:    Right Eye Near:   Left Eye Near:    Bilateral Near:     Physical Exam  Constitutional: She is oriented to person, place, and time.  Musculoskeletal:  Right knee without effusion, small abrasions anteriorly, full ROM with some pain anterior, no obvious swelling or deformity  Neurological: She is alert and oriented to person, place, and time.  Skin: Skin is warm and dry.  Mild excoriations to the anterior knee  Psychiatric: Her behavior is normal.  Nursing note and vitals reviewed.    UC Treatments / Results  Labs (all labs  ordered are listed, but only abnormal results are displayed) Labs Reviewed - No data to display  EKG  EKG Interpretation None       Radiology  Dg Knee Complete 4 Views Right  Result Date: 07/17/2017 CLINICAL DATA:  Fall yesterday and landed on her Right knee, abrasions on pattellar aspect, fall was on a concrete surface. Pain centered around patella, soreness from tibial tuberosity to medial and lateral aspect of knee. EXAM: RIGHT KNEE - COMPLETE 4+ VIEW COMPARISON:  Prior studies including 12/24/2014, 03/28/2014, and 01/02/2011 FINDINGS: There are degenerative changes involving the patellofemoral compartment and medial compartment. There is no acute fracture or subluxation. Small joint effusion is present. There is soft tissue swelling along the anterior aspect of the knee. IMPRESSION: 1. Degenerative changes. 2. Small effusion and anterior soft tissue swelling. Electronically Signed   By: Nolon Nations M.D.   On: 07/17/2017 14:10    Procedures Procedures (including critical care time)  Medications Ordered in UC Medications - No data to display   Initial Impression / Assessment and Plan / UC Course  I have reviewed the triage vital signs and the nursing notes.  Pertinent labs & imaging results that were available during my care of the patient were reviewed by me and considered in my medical decision making (see chart for details).   All to right knee with no fractures but underlying OA. Treat with rest, ice and use of Voltaren Gel. Supplement with Tylenol.  When on way to door she states that her medications need refills, however in Epic it very clearly states these were refilled and I instructed her to f/u with her physician regarding this tomorrow.   Final Clinical Impressions(s) / UC Diagnoses   Final diagnoses:  Acute pain of right knee  Fall, initial encounter    ED Discharge Orders        Ordered    diclofenac sodium (VOLTAREN) 1 % GEL  4 times daily     07/17/17 1435        Controlled Substance Prescriptions [place] Controlled Substance Registry consulted? Not Applicable   Prudencio Pair 07/17/17 1438

## 2017-07-17 NOTE — ED Triage Notes (Signed)
Per pt she fell yesterday and landed on her Right knee,

## 2017-07-18 ENCOUNTER — Telehealth (HOSPITAL_COMMUNITY): Payer: Self-pay | Admitting: *Deleted

## 2017-07-18 MED ORDER — MELOXICAM 7.5 MG PO TABS: 7.5000 mg | ORAL_TABLET | Freq: Every day | ORAL | 0 refills | Status: DC

## 2017-07-18 NOTE — Telephone Encounter (Signed)
Patient called today stating that the voltaren cream prescribed yesterday was to expensive. Spoke with NP Augusto Gamble and new prescription sent to pharmacy of choice. Called patient and updated her on her POC.

## 2017-08-31 ENCOUNTER — Other Ambulatory Visit: Payer: Self-pay | Admitting: Internal Medicine

## 2017-08-31 DIAGNOSIS — E2839 Other primary ovarian failure: Secondary | ICD-10-CM

## 2017-09-06 ENCOUNTER — Ambulatory Visit: Payer: Self-pay | Admitting: Internal Medicine

## 2017-09-07 ENCOUNTER — Encounter: Payer: Self-pay | Admitting: Internal Medicine

## 2017-09-07 ENCOUNTER — Ambulatory Visit (INDEPENDENT_AMBULATORY_CARE_PROVIDER_SITE_OTHER): Payer: 59 | Admitting: Internal Medicine

## 2017-09-07 ENCOUNTER — Ambulatory Visit: Payer: Self-pay | Admitting: Internal Medicine

## 2017-09-07 VITALS — BP 134/80 | HR 60 | Ht 69.0 in | Wt 150.0 lb

## 2017-09-07 DIAGNOSIS — I5021 Acute systolic (congestive) heart failure: Secondary | ICD-10-CM | POA: Diagnosis not present

## 2017-09-07 DIAGNOSIS — E039 Hypothyroidism, unspecified: Secondary | ICD-10-CM

## 2017-09-07 DIAGNOSIS — I10 Essential (primary) hypertension: Secondary | ICD-10-CM

## 2017-09-07 NOTE — Patient Instructions (Signed)
Your physician has requested that you have an echocardiogram in 6 months @ [address] - [address]. Echocardiography is a painless test that uses sound waves to create images of your heart. It provides your doctor with information about the size and shape of your heart and how well your heart's chambers and valves are working. This procedure takes approximately one hour. There are no restrictions for this procedure.  Goal BP = 120/80  Your physician wants you to follow-up in: 6 months with Dr. Debara Pickett after echo. You will receive a reminder letter in the mail two months in advance. If you don't receive a letter, please call our office to schedule the follow-up appointment.

## 2017-09-07 NOTE — Progress Notes (Signed)
OFFICE NOTE  Chief Complaint:  Routine follow-up  Primary Care Physician: Nolene Ebbs, MD  HPI:  Jamie Hicks is a 62 y.o. female with a past medial history significant for etoh and cocaine abuse, HTN and recent admission for Influenza A pneumonia - this required intubation and mechanical ventilation and was complicated by the development of acute systolic congestive heart failure with LVEF 35-40%.  She was placed on appropriate heart failure medications and discharged.  She returns today for follow-up.  She reports improvement in her breathing since discharge.  She was recently in the hospital for chest discomfort but was diagnosed with reflux and her symptoms improved on famotidine.  She currently denies any chest pain.  She says she is completely stopped alcohol and tobacco abuse as well as illicit drugs.  She says she wants to lead a healthier cleaner lifestyle.  Her only complaints are for some nasal congestion related to her apartment.  She says it is an old building and she is looking at moving to a new apartment starting in January.  She reports compliance with her medications although has not started aspirin.  Blood pressure was elevated today 146/88 which did not change after 10 minutes with a repeat blood pressure check.   12/2017  Jamie Hicks returns today for follow-up.  She reports in the interim she has given up alcohol and smoking.  She is committed to living a healthier life.  She is followed up with Dr. Jeanie Cooks.  She has been compliant with her medications.  I recently increased her losartan up to 50 mg and her blood pressure today is 134/80.  Weight is been stable.  She denies any worsening swelling, shortness of breath or chest pain.  PMHx:  Past Medical History:  Diagnosis Date  . Back pain   . Hypertension   . Thyroid disease     Past Surgical History:  Procedure Laterality Date  . HERNIA REPAIR    . knee arthorscopy      FAMHx:  Family History    Problem Relation Age of Onset  . Hypertension Mother        Living, currently 44 years old  . Cancer Father     SOCHx:   reports that she quit smoking about 4 months ago. Her smoking use included cigarettes. She has a 40.00 pack-year smoking history. She has never used smokeless tobacco. She reports that she drinks alcohol. She reports that she has current or past drug history. Drug: "Crack" cocaine.  ALLERGIES:  Allergies  Allergen Reactions  . Amoxicillin     Has patient had a PCN reaction causing immediate rash, facial/tongue/throat swelling, SOB or lightheadedness with hypotension: yes Has patient had a PCN reaction causing severe rash involving mucus membranes or skin necrosis: no Has patient had a PCN reaction that required hospitalization: yes  Has patient had a PCN reaction occurring within the last 10 years: yes, 08/29/16 If all of the above answers are "NO", then may proceed with Cephalosporin use.   Marland Kitchen Lisinopril Cough  . Latex Rash    ROS: Pertinent items noted in HPI and remainder of comprehensive ROS otherwise negative.  HOME MEDS: Current Outpatient Medications on File Prior to Visit  Medication Sig Dispense Refill  . acetaminophen (TYLENOL) 325 MG tablet Take 325 mg by mouth daily as needed for moderate pain.    Marland Kitchen aspirin EC 81 MG tablet Take 81 mg by mouth daily.    . carvedilol (COREG) 3.125 MG tablet Take  1 tablet (3.125 mg total) by mouth 2 (two) times daily with a meal. 60 tablet 0  . cyclobenzaprine (FLEXERIL) 10 MG tablet Take 1 tablet (10 mg total) by mouth at bedtime. 14 tablet 0  . diclofenac sodium (VOLTAREN) 1 % GEL Apply 4 g topically 4 (four) times daily. 2 Tube 3  . diphenhydrAMINE (BENADRYL) 25 MG tablet Take 1 tablet (25 mg total) by mouth every 6 (six) hours as needed. 20 tablet 0  . famotidine (PEPCID) 20 MG tablet Take 1 tablet (20 mg total) by mouth 2 (two) times daily. 60 tablet 11  . furosemide (LASIX) 20 MG tablet Take 1 tablet (20 mg total)  by mouth daily. 30 tablet 0  . hydroxypropyl methylcellulose / hypromellose (ISOPTO TEARS / GONIOVISC) 2.5 % ophthalmic solution Place 1 drop into both eyes 2 (two) times daily.    Marland Kitchen ipratropium (ATROVENT) 0.06 % nasal spray Place 2 sprays into both nostrils 4 (four) times daily. For allergies 30 mL 0  . levothyroxine (SYNTHROID, LEVOTHROID) 75 MCG tablet Take 1 tablet (75 mcg total) by mouth every morning. 30 tablet 0  . loratadine (CLARITIN) 10 MG tablet Take 1 tablet (10 mg total) by mouth daily. (This medicine may be purchased from over there counter at yr local pharmacy): For allergies 30 tablet 0  . losartan (COZAAR) 50 MG tablet Take 1 tablet (50 mg total) by mouth daily. 30 tablet 11  . meloxicam (MOBIC) 7.5 MG tablet Take 1 tablet (7.5 mg total) by mouth daily. 20 tablet 0   No current facility-administered medications on file prior to visit.     LABS/IMAGING: No results found for this or any previous visit (from the past 48 hour(s)). No results found.  LIPID PANEL:    Component Value Date/Time   CHOL 238 (H) 09/22/2012 1333   TRIG 74 04/19/2017 2332   HDL 47 09/22/2012 1333   CHOLHDL 5.1 09/22/2012 1333   VLDL 47 (H) 09/22/2012 1333   LDLCALC 144 (H) 09/22/2012 1333     WEIGHTS: Wt Readings from Last 3 Encounters:  09/07/17 150 lb (68 kg)  05/31/17 238 lb 9.6 oz (108.2 kg)  04/23/17 225 lb 8.5 oz (102.3 kg)    VITALS: BP 134/80 (BP Location: Left Arm, Patient Position: Sitting, Cuff Size: Large)   Pulse 60   Ht 5\' 9"  (1.753 m)   Wt 150 lb (68 kg)   LMP 07/21/2014 Comment: current  BMI 22.15 kg/m   EXAM: General appearance: alert, no distress and moderately obese Neck: no carotid bruit, no JVD and thyroid not enlarged, symmetric, no tenderness/mass/nodules Lungs: clear to auscultation bilaterally Heart: regular rate and rhythm, S1, S2 normal, no murmur, click, rub or gallop Abdomen: soft, non-tender; bowel sounds normal; no masses,  no  organomegaly Extremities: extremities normal, atraumatic, no cyanosis or edema Pulses: 2+ and symmetric Skin: Skin color, texture, turgor normal. No rashes or lesions Neurologic: Grossly normal Psych: Pleasant  EKG: Normal sinus rhythm at 60, probable left atrial enlargement, LVH by voltage-personally reviewed  ASSESSMENT: 1. Acute systolic congestive heart failure-LVEF 35-40% (2018) 2. Recent influenza A pneumonia 3. History of polysubstance abuse 4. Hypertension  PLAN: 1.   Jamie Hicks is committed to healthier lifestyle.  She is compliant with her medications.  She is given up smoking and alcohol.  Blood pressure is well controlled.  She is on appropriate heart failure medications.  We will continue this and plan a repeat echocardiogram in about 6 months.  Follow-up with me  afterwards.  Pixie Casino, MD, [facility], [place] Director of the [facility] Lip[facility]  [facility] Attending Cardiologist  Direct Dial: [phone]  Fax: [phone]  Website:  [email or web address] 09/07/2017, 4:42 PM

## 2017-09-21 ENCOUNTER — Ambulatory Visit (INDEPENDENT_AMBULATORY_CARE_PROVIDER_SITE_OTHER): Payer: Self-pay | Admitting: Orthopaedic Surgery

## 2017-09-26 ENCOUNTER — Encounter (INDEPENDENT_AMBULATORY_CARE_PROVIDER_SITE_OTHER): Payer: Self-pay | Admitting: Orthopaedic Surgery

## 2017-09-26 ENCOUNTER — Ambulatory Visit (INDEPENDENT_AMBULATORY_CARE_PROVIDER_SITE_OTHER): Payer: 59

## 2017-09-26 ENCOUNTER — Ambulatory Visit (INDEPENDENT_AMBULATORY_CARE_PROVIDER_SITE_OTHER): Payer: 59 | Admitting: Orthopaedic Surgery

## 2017-09-26 DIAGNOSIS — M1712 Unilateral primary osteoarthritis, left knee: Secondary | ICD-10-CM | POA: Diagnosis not present

## 2017-09-26 DIAGNOSIS — M25562 Pain in left knee: Secondary | ICD-10-CM

## 2017-09-26 DIAGNOSIS — M25561 Pain in right knee: Secondary | ICD-10-CM

## 2017-09-26 DIAGNOSIS — M1711 Unilateral primary osteoarthritis, right knee: Secondary | ICD-10-CM | POA: Insufficient documentation

## 2017-09-26 DIAGNOSIS — G8929 Other chronic pain: Secondary | ICD-10-CM | POA: Diagnosis not present

## 2017-09-26 NOTE — Progress Notes (Signed)
Office Visit Note   Patient: Jamie Hicks           Date of Birth: 1956-03-30           MRN: [id number] Visit Date: 09/26/2017              Requested by: Nolene Ebbs, MD [address] [place], [place] [address] PCP: Nolene Ebbs, MD   Assessment & Plan: Visit Diagnoses:  1. Chronic pain of left knee   2. Unilateral primary osteoarthritis, left knee   3. Unilateral primary osteoarthritis, right knee   4. Chronic pain of right knee     Plan: We went over her x-rays with her in detail.  I do feel that she would benefit from guided outpatient physical therapy to strengthen both her knees.  I talked her about steroid injections and hyaluronic acid injections as different treatment modalities to try to help her moderate arthritic pain.  The risk and benefits of injections were explained in detail and she did wish to try this treatment approach.  We will set up outpatient physical therapy for her and we will see her back in 4 weeks to place hyaluronic acid in both knees.  All questions concerns were answered and addressed.  Follow-Up Instructions: Return in about 1 month (around 10/24/2017).   Orders:  Orders Placed This Encounter  Procedures  . Large Joint Inj  . XR Knee 1-2 Views Left   No orders of the defined types were placed in this encounter.     Procedures: Large Joint Inj: bilateral knee on 09/26/2017 11:24 AM Indications: diagnostic evaluation and pain Details: 22 G 1.5 in needle, superolateral approach  Arthrogram: No  Outcome: tolerated well, no immediate complications Procedure, treatment alternatives, risks and benefits explained, specific risks discussed. Consent was given by the patient. Immediately prior to procedure a time out was called to verify the correct patient, procedure, equipment, support staff and site/side marked as required. Patient was prepped and draped in the usual sterile fashion.       Clinical Data: No additional  findings.   Subjective: Chief Complaint  Patient presents with  . Right Knee - Pain  Patient is a very pleasant 62 year old female who went to the emergency room in February due to a mechanical fall on her right knee where she felt like she injured her kneecap.  X-rays were obtained.  Both knees been bothering her quite a bit and generally she is been sitting for long period time and gets up to stand she has a lot of stiffness in her knees and she feels like she is losing balance.  She is asking about the possibility of physical therapy for her knees as well.  They crack and pop on her quite a bit as well.  And she feels like she is slowing down too much due to her knees.  HPI  Review of Systems She currently denies any headache, chest pain, shortness of breath, fever, chills, nausea, vomiting.  Objective: Vital Signs: LMP 07/21/2014 Comment: current  Physical Exam She is alert and oriented x3 and in no acute distress.  She does get up on the exam table slowly. Ortho Exam Both knees are examined.  Both knees have significant patellofemoral crepitation.  Both knees have slight varus malalignment with significant medial joint line tenderness bilaterally.  There is no effusion on either knee both knees have good range of motion but are painful. Specialty Comments:  No specialty comments available.  Imaging: Xr Knee  1-2 Views Left  Result Date: 09/26/2017 2 views of the left knee show significant osteoarthritis in all 3 compartments.  There is medial compartment joint space narrowing.  There is significant patellofemoral chondromalacia and disease.  There are periarticular osteophytes throughout the knee.  X-rays of her right knee on the canopy system are independently reviewed and show moderate arthritic changes throughout the knee with joint space narrowing and significant para-articular osteophytes.   PMFS History: Patient Active Problem List   Diagnosis Date Noted  . Chronic pain  of left knee 09/26/2017  . Unilateral primary osteoarthritis, left knee 09/26/2017  . Unilateral primary osteoarthritis, right knee 09/26/2017  . Chronic pain of right knee 09/26/2017  . Polysubstance abuse ([place]) 05/31/2017  . Acute systolic (congestive) heart failure ([place]) 04/25/2017  . Acute respiratory failure with hypoxia ([place])   . ARDS (adult respiratory distress syndrome) ([place])   . Acute respiratory distress   . Community acquired pneumonia of left upper lobe of lung ([place])   . Hypoxemia   . Influenza A 04/18/2017  . Left upper lobe pneumonia ([place]) 04/18/2017  . Obesity, Class III, BMI 40-49.9 (morbid obesity) ([place]) 04/18/2017  . Thyroid activity decreased   . Cocaine dependence with cocaine-induced mood disorder ([place]) 07/22/2014  . Major depressive disorder, recurrent, severe without psychotic features ([place])   . Substance induced mood disorder ([place]) 07/19/2014  . Suicidal ideation   . Homicidal ideation   . Osteoarthritis 09/24/2012  . Prediabetes 09/24/2012  . Dyslipidemia 09/24/2012  . Vitamin D insufficiency 09/24/2012  . Hypothyroidism 09/22/2012  . Bilateral chronic knee pain 09/22/2012  . Smoker 09/22/2012  . Essential hypertension 09/22/2012   Past Medical History:  Diagnosis Date  . Back pain   . Hypertension   . Thyroid disease     Family History  Problem Relation Age of Onset  . Hypertension Mother        Living, currently 30 years old  . Cancer Father     Past Surgical History:  Procedure Laterality Date  . HERNIA REPAIR    . knee arthorscopy     Social History   Occupational History  . Not on file  Tobacco Use  . Smoking status: Former Smoker    Packs/day: 1.00    Years: 40.00    Pack years: 40.00    Types: Cigarettes    Last attempt to quit: 05/01/2017    Years since quitting: 0.4  . Smokeless tobacco: Never Used  Substance and Sexual Activity  . Alcohol use: Yes    Comment: 1+ pints of Liquor per day  . Drug use: Yes    Types: "Crack"  cocaine  . Sexual activity: Not on file

## 2017-09-27 ENCOUNTER — Other Ambulatory Visit (INDEPENDENT_AMBULATORY_CARE_PROVIDER_SITE_OTHER): Payer: Self-pay

## 2017-09-27 DIAGNOSIS — M25562 Pain in left knee: Principal | ICD-10-CM

## 2017-09-27 DIAGNOSIS — M25561 Pain in right knee: Secondary | ICD-10-CM

## 2017-09-28 ENCOUNTER — Telehealth (INDEPENDENT_AMBULATORY_CARE_PROVIDER_SITE_OTHER): Payer: Self-pay

## 2017-09-28 ENCOUNTER — Other Ambulatory Visit: Payer: Self-pay | Admitting: Internal Medicine

## 2017-09-28 DIAGNOSIS — Z1231 Encounter for screening mammogram for malignant neoplasm of breast: Secondary | ICD-10-CM

## 2017-09-28 NOTE — Telephone Encounter (Signed)
Submitted application online for Monovisc injection, bilateral knee. 

## 2017-10-11 ENCOUNTER — Ambulatory Visit: Payer: Self-pay | Admitting: Internal Medicine

## 2017-10-13 ENCOUNTER — Ambulatory Visit (INDEPENDENT_AMBULATORY_CARE_PROVIDER_SITE_OTHER): Payer: 59 | Admitting: Orthopaedic Surgery

## 2017-10-13 ENCOUNTER — Encounter (INDEPENDENT_AMBULATORY_CARE_PROVIDER_SITE_OTHER): Payer: Self-pay | Admitting: Orthopaedic Surgery

## 2017-10-13 DIAGNOSIS — M25462 Effusion, left knee: Secondary | ICD-10-CM

## 2017-10-13 NOTE — Progress Notes (Signed)
The patient comes in today with acute left knee swelling and effusion.  She had bilateral cortisone injections in her knees on 09/26/2017.  The right knee is not swollen at all but the left knee has swollen quite a bit.  On exam there is a moderate effusion of her left knee.  There is no redness no slight warmth.  She is not having fever and chills.  The knee is ligamentously stable.  I was able to aspirate 35 cc of serous fluid from the knee.  This was clear yellow fluid.  This gave her symptomatic relief.  We will see her back in about  4 weeks to see how she is doing overall.  All I recommend is quad strengthening exercises and anti-inflammatories over-the-counter.

## 2017-10-21 ENCOUNTER — Telehealth (INDEPENDENT_AMBULATORY_CARE_PROVIDER_SITE_OTHER): Payer: Self-pay

## 2017-10-21 NOTE — Telephone Encounter (Signed)
Initiated PA for Monovisc injection [phone]), bilateral knee.  Pending Auth.# [id number] per [employer].  With [facility].

## 2017-10-25 ENCOUNTER — Telehealth (INDEPENDENT_AMBULATORY_CARE_PROVIDER_SITE_OTHER): Payer: Self-pay

## 2017-10-25 ENCOUNTER — Ambulatory Visit (INDEPENDENT_AMBULATORY_CARE_PROVIDER_SITE_OTHER): Payer: 59 | Admitting: Orthopaedic Surgery

## 2017-10-25 NOTE — Telephone Encounter (Signed)
Called and left a VM for Felicia with [employer] concerning patient's authorization.

## 2017-11-01 ENCOUNTER — Ambulatory Visit
Admission: RE | Admit: 2017-11-01 | Discharge: 2017-11-01 | Disposition: A | Discharge status: Home / Self Care | Payer: 59 | Source: Ambulatory Visit | Attending: Internal Medicine | Admitting: Internal Medicine

## 2017-11-01 DIAGNOSIS — Z1231 Encounter for screening mammogram for malignant neoplasm of breast: Secondary | ICD-10-CM

## 2017-11-01 DIAGNOSIS — E2839 Other primary ovarian failure: Secondary | ICD-10-CM

## 2017-11-08 ENCOUNTER — Telehealth (INDEPENDENT_AMBULATORY_CARE_PROVIDER_SITE_OTHER): Payer: Self-pay

## 2017-11-08 NOTE — Telephone Encounter (Signed)
Patient is approved for Monovisc injection, bilateral knee. Authorization# is [id number]  Covered at 80%, patient will be responsible for 20% OOP, which could be an estimate of $350.00. Buy & Bill No Co-pay Appt.scheduled 11/10/17

## 2017-11-10 ENCOUNTER — Encounter (INDEPENDENT_AMBULATORY_CARE_PROVIDER_SITE_OTHER): Payer: Self-pay | Admitting: Orthopaedic Surgery

## 2017-11-10 ENCOUNTER — Ambulatory Visit (INDEPENDENT_AMBULATORY_CARE_PROVIDER_SITE_OTHER): Payer: 59 | Admitting: Orthopaedic Surgery

## 2017-11-10 DIAGNOSIS — M1712 Unilateral primary osteoarthritis, left knee: Secondary | ICD-10-CM

## 2017-11-10 DIAGNOSIS — M1711 Unilateral primary osteoarthritis, right knee: Secondary | ICD-10-CM

## 2017-11-10 MED ORDER — LIDOCAINE HCL 1 % IJ SOLN
3.0000 mL | INTRAMUSCULAR | Status: AC | PRN
  Administered 2017-11-10: 3 mL

## 2017-11-10 MED ORDER — HYALURONAN 88 MG/4ML IX SOSY
88.0000 mg | PREFILLED_SYRINGE | INTRA_ARTICULAR | Status: AC | PRN
Start: 2017-11-10 — End: 2017-11-10
  Administered 2017-11-10: 88 mg via INTRA_ARTICULAR

## 2017-11-10 MED ORDER — HYALURONAN 88 MG/4ML IX SOSY
88.0000 mg | PREFILLED_SYRINGE | INTRA_ARTICULAR | Status: AC | PRN
  Administered 2017-11-10: 88 mg via INTRA_ARTICULAR

## 2017-11-10 NOTE — Progress Notes (Signed)
   Procedure Note  Patient: Jamie Hicks             Date of Birth: 12-15-1955           MRN: [id number]             Visit Date: 11/10/2017  Procedures: Visit Diagnoses: Unilateral primary osteoarthritis, left knee  Unilateral primary osteoarthritis, right knee  Large Joint Inj: R knee on 11/10/2017 1:52 PM Indications: diagnostic evaluation and pain Details: 22 G 1.5 in needle, superolateral approach  Arthrogram: No  Medications: 3 mL lidocaine 1 %; 88 mg Hyaluronan 88 MG/4ML Outcome: tolerated well, no immediate complications Procedure, treatment alternatives, risks and benefits explained, specific risks discussed. Consent was given by the patient. Immediately prior to procedure a time out was called to verify the correct patient, procedure, equipment, support staff and site/side marked as required. Patient was prepped and draped in the usual sterile fashion.   Large Joint Inj: L knee on 11/10/2017 1:52 PM Indications: diagnostic evaluation and pain Details: 22 G 1.5 in needle, superolateral approach  Arthrogram: No  Medications: 3 mL lidocaine 1 %; 88 mg Hyaluronan 88 MG/4ML Outcome: tolerated well, no immediate complications Procedure, treatment alternatives, risks and benefits explained, specific risks discussed. Consent was given by the patient. Immediately prior to procedure a time out was called to verify the correct patient, procedure, equipment, support staff and site/side marked as required. Patient was prepped and draped in the usual sterile fashion.    The patient is here for scheduled bilateral knee injections with hyaluronic acid using Monovisc.  She has significant arthritis in both her knees with valgus malalignment of her knees.  We are trying everything conservative to avoid knee replacement surgery.  She would like to try everything conservative as well.  She is Artie had steroid injections both knees.  On exam both knees have valgus malalignment with  painful range of motion that is full.  Both knees have global tenderness.  She tolerated the Monovisc injection well in both knees.  We will see her back in about 3 months to see how she is doing overall.  No x-rays are needed.

## 2018-02-10 ENCOUNTER — Telehealth (INDEPENDENT_AMBULATORY_CARE_PROVIDER_SITE_OTHER): Payer: Self-pay | Admitting: Orthopaedic Surgery

## 2018-02-10 NOTE — Telephone Encounter (Signed)
Paige from [facility] called advised order for patient's wrist support was received but, it was faded and blurry. Paige asked if the order can be faxed again to her. The number to contact Arby Barrette is [phone]   Ext: [phone]

## 2018-02-10 NOTE — Telephone Encounter (Signed)
You haven't seen anything on this patient have you? We haven't even seen her since May

## 2018-02-15 ENCOUNTER — Ambulatory Visit (INDEPENDENT_AMBULATORY_CARE_PROVIDER_SITE_OTHER): Payer: 59 | Admitting: Orthopaedic Surgery

## 2018-02-16 NOTE — Telephone Encounter (Signed)
done

## 2018-03-01 ENCOUNTER — Ambulatory Visit (INDEPENDENT_AMBULATORY_CARE_PROVIDER_SITE_OTHER): Payer: 59 | Admitting: Orthopaedic Surgery

## 2018-03-03 ENCOUNTER — Other Ambulatory Visit: Payer: Self-pay

## 2018-03-03 ENCOUNTER — Ambulatory Visit (HOSPITAL_COMMUNITY): Payer: 59 | Attending: Cardiology

## 2018-03-03 DIAGNOSIS — I5021 Acute systolic (congestive) heart failure: Secondary | ICD-10-CM | POA: Diagnosis not present

## 2018-03-03 DIAGNOSIS — I110 Hypertensive heart disease with heart failure: Secondary | ICD-10-CM | POA: Insufficient documentation

## 2018-03-03 DIAGNOSIS — E079 Disorder of thyroid, unspecified: Secondary | ICD-10-CM | POA: Diagnosis not present

## 2018-03-03 DIAGNOSIS — I340 Nonrheumatic mitral (valve) insufficiency: Secondary | ICD-10-CM | POA: Insufficient documentation

## 2018-03-08 ENCOUNTER — Ambulatory Visit (INDEPENDENT_AMBULATORY_CARE_PROVIDER_SITE_OTHER): Payer: 59 | Admitting: Orthopaedic Surgery

## 2018-03-08 ENCOUNTER — Encounter (INDEPENDENT_AMBULATORY_CARE_PROVIDER_SITE_OTHER): Payer: Self-pay | Admitting: Orthopaedic Surgery

## 2018-03-08 DIAGNOSIS — M1711 Unilateral primary osteoarthritis, right knee: Secondary | ICD-10-CM | POA: Diagnosis not present

## 2018-03-08 DIAGNOSIS — M1712 Unilateral primary osteoarthritis, left knee: Secondary | ICD-10-CM | POA: Diagnosis not present

## 2018-03-08 MED ORDER — LIDOCAINE HCL 1 % IJ SOLN
3.0000 mL | INTRAMUSCULAR | Status: AC | PRN
  Administered 2018-03-08: 3 mL

## 2018-03-08 MED ORDER — IBUPROFEN 800 MG PO TABS: 800.0000 mg | ORAL_TABLET | Freq: Three times a day (TID) | ORAL | 3 refills | Status: DC | PRN

## 2018-03-08 MED ORDER — METHYLPREDNISOLONE ACETATE 40 MG/ML IJ SUSP
40.0000 mg | INTRAMUSCULAR | Status: AC | PRN
  Administered 2018-03-08: 40 mg via INTRA_ARTICULAR

## 2018-03-08 NOTE — Progress Notes (Signed)
Office Visit Note   Patient: Jamie Hicks           Date of Birth: 03-05-1956           MRN: [id number] Visit Date: 03/08/2018              Requested by: Nolene Ebbs, MD [address] [place], [place] [address] PCP: Nolene Ebbs, MD   Assessment & Plan: Visit Diagnoses:  1. Unilateral primary osteoarthritis, left knee   2. Unilateral primary osteoarthritis, right knee     Plan: At this point the main recommendation with the knee replacement in both knees at different times once he gets to where her pain is detrimentally affecting her active daily living, quality of life, and her mobility.  She is still not interested at this standpoint and I agree with trying steroid injections both knees today as well as continuing her ibuprofen.  She will still work on intensive weight loss and quad strengthening exercises.  All question concerns were answered and addressed.  She knows that she can always have injections in the wintertime at least 34 months from now again if needed.  I would not place hyaluronic acid injections in her knees again since they have not helped.  Follow-Up Instructions: Return if symptoms worsen or fail to improve.   Orders:  Orders Placed This Encounter  Procedures  . Large Joint Inj  . Large Joint Inj   Meds ordered this encounter  Medications  . ibuprofen (ADVIL,MOTRIN) 800 MG tablet    Sig: Take 1 tablet (800 mg total) by mouth every 8 (eight) hours as needed.    Dispense:  90 tablet    Refill:  3      Procedures: Large Joint Inj: R knee on 03/08/2018 11:11 AM Indications: diagnostic evaluation and pain Details: 22 G 1.5 in needle, superolateral approach  Arthrogram: No  Medications: 3 mL lidocaine 1 %; 40 mg methylPREDNISolone acetate 40 MG/ML Outcome: tolerated well, no immediate complications Procedure, treatment alternatives, risks and benefits explained, specific risks discussed. Consent was given by the patient. Immediately prior to  procedure a time out was called to verify the correct patient, procedure, equipment, support staff and site/side marked as required. Patient was prepped and draped in the usual sterile fashion.   Large Joint Inj: L knee on 03/08/2018 11:11 AM Indications: diagnostic evaluation and pain Details: 22 G 1.5 in needle, superolateral approach  Arthrogram: No  Medications: 3 mL lidocaine 1 %; 40 mg methylPREDNISolone acetate 40 MG/ML Outcome: tolerated well, no immediate complications Procedure, treatment alternatives, risks and benefits explained, specific risks discussed. Consent was given by the patient. Immediately prior to procedure a time out was called to verify the correct patient, procedure, equipment, support staff and site/side marked as required. Patient was prepped and draped in the usual sterile fashion.       Clinical Data: No additional findings.   Subjective: Chief Complaint  Patient presents with  . Left Knee - Follow-up  . Right Knee - Follow-up  Patient comes in with bilateral knee pain and known moderate severe osteoarthritis of both knees.  She is 62 years old.  She has had steroid injections and hyaluronic acid injections in both knees.  She is moderately obese but not severely obese.  She is work on activity modification and weight loss.  She does take anti-inflammatories and would like a prescription for 800 mg ibuprofen.  Her knees are flared up a lot on her recently.  She is  tried assistive devices and getting around.  She would like to have a steroid injection in both knees today.  She is still not itching knee replacement surgery.  HPI  Review of Systems She currently denies any headache, chest pain, shortness of breath, fever, chills, nausea  Objective: Vital Signs: LMP 07/21/2014 Comment: current  Physical Exam She is alert and oriented x3 and in no acute distress Ortho Exam Examination of both knees show full range of motion but patellofemoral rotation  and varus malalignment.  Both knees have significant medial joint line tenderness. Specialty Comments:  No specialty comments available.  Imaging: No results found. X-rays of both knees from earlier are reviewed and show tricompartment arthritis of both knees.  PMFS History: Patient Active Problem List   Diagnosis Date Noted  . Effusion, left knee 10/13/2017  . Chronic pain of left knee 09/26/2017  . Unilateral primary osteoarthritis, left knee 09/26/2017  . Unilateral primary osteoarthritis, right knee 09/26/2017  . Chronic pain of right knee 09/26/2017  . Polysubstance abuse ([place]) 05/31/2017  . Acute systolic (congestive) heart failure ([place]) 04/25/2017  . Acute respiratory failure with hypoxia ([place])   . ARDS (adult respiratory distress syndrome) ([place])   . Acute respiratory distress   . Community acquired pneumonia of left upper lobe of lung ([place])   . Hypoxemia   . Influenza A 04/18/2017  . Left upper lobe pneumonia ([place]) 04/18/2017  . Obesity, Class III, BMI 40-49.9 (morbid obesity) ([place]) 04/18/2017  . Thyroid activity decreased   . Cocaine dependence with cocaine-induced mood disorder ([place]) 07/22/2014  . Major depressive disorder, recurrent, severe without psychotic features ([place])   . Substance induced mood disorder ([place]) 07/19/2014  . Suicidal ideation   . Homicidal ideation   . Osteoarthritis 09/24/2012  . Prediabetes 09/24/2012  . Dyslipidemia 09/24/2012  . Vitamin D insufficiency 09/24/2012  . Hypothyroidism 09/22/2012  . Bilateral chronic knee pain 09/22/2012  . Smoker 09/22/2012  . Essential hypertension 09/22/2012   Past Medical History:  Diagnosis Date  . Back pain   . Hypertension   . Thyroid disease     Family History  Problem Relation Age of Onset  . Hypertension Mother        Living, currently 46 years old  . Cancer Father     Past Surgical History:  Procedure Laterality Date  . HERNIA REPAIR    . knee arthorscopy     Social History    Occupational History  . Not on file  Tobacco Use  . Smoking status: Former Smoker    Packs/day: 1.00    Years: 40.00    Pack years: 40.00    Types: Cigarettes    Last attempt to quit: 05/01/2017    Years since quitting: 0.8  . Smokeless tobacco: Never Used  Substance and Sexual Activity  . Alcohol use: Yes    Comment: 1+ pints of Liquor per day  . Drug use: Yes    Types: "Crack" cocaine  . Sexual activity: Not on file

## 2018-03-09 ENCOUNTER — Encounter: Payer: Self-pay | Admitting: Internal Medicine

## 2018-03-09 ENCOUNTER — Ambulatory Visit (INDEPENDENT_AMBULATORY_CARE_PROVIDER_SITE_OTHER): Payer: 59 | Admitting: Internal Medicine

## 2018-03-09 VITALS — BP 153/94 | HR 75 | Ht 68.0 in | Wt 250.0 lb

## 2018-03-09 DIAGNOSIS — R400 Somnolence: Secondary | ICD-10-CM | POA: Insufficient documentation

## 2018-03-09 DIAGNOSIS — R0683 Snoring: Secondary | ICD-10-CM

## 2018-03-09 DIAGNOSIS — I10 Essential (primary) hypertension: Secondary | ICD-10-CM | POA: Diagnosis not present

## 2018-03-09 DIAGNOSIS — I5022 Chronic systolic (congestive) heart failure: Secondary | ICD-10-CM | POA: Diagnosis not present

## 2018-03-09 NOTE — Progress Notes (Signed)
OFFICE NOTE  Chief Complaint:  Follow-up echo  Primary Care Physician: Nolene Ebbs, MD  HPI:  Jamie Hicks is a 62 y.o. female with a past medial history significant for etoh and cocaine abuse, HTN and recent admission for Influenza A pneumonia - this required intubation and mechanical ventilation and was complicated by the development of acute systolic congestive heart failure with LVEF 35-40%.  She was placed on appropriate heart failure medications and discharged.  She returns today for follow-up.  She reports improvement in her breathing since discharge.  She was recently in the hospital for chest discomfort but was diagnosed with reflux and her symptoms improved on famotidine.  She currently denies any chest pain.  She says she is completely stopped alcohol and tobacco abuse as well as illicit drugs.  She says she wants to lead a healthier cleaner lifestyle.  Her only complaints are for some nasal congestion related to her apartment.  She says it is an old building and she is looking at moving to a new apartment starting in January.  She reports compliance with her medications although has not started aspirin.  Blood pressure was elevated today 146/88 which did not change after 10 minutes with a repeat blood pressure check.   12/2017  Jamie Hicks returns today for follow-up.  She reports in the interim she has given up alcohol and smoking.  She is committed to living a healthier life.  She is followed up with Dr. Jeanie Cooks.  She has been compliant with her medications.  I recently increased her losartan up to 50 mg and her blood pressure today is 134/80.  Weight is been stable.  She denies any worsening swelling, shortness of breath or chest pain.  03/09/2018  Jamie Hicks is seen today in follow-up.  She just turned 62 and overall she feels great.  She has been abstinent for alcohol for almost a year and will get her 1 year sobriety chip from AA.  She says that recently she has  had some fatigue and wonders because she snores loudly whether she could have sleep apnea.  She is certainly overweight with a thick neck and has witnessed snoring and nonrestorative sleep.  Fortunately, I can report that her EF has improved now up to 55 to 60%.  She will need to remain on heart failure medicines long-term.  Blood pressure was little elevated today and should be monitored at home.  She does not currently have a cough and I recommended Omron.  We will need to monitor home blood pressures and see if her meds should be adjusted.   PMHx:  Past Medical History:  Diagnosis Date  . Back pain   . Hypertension   . Thyroid disease     Past Surgical History:  Procedure Laterality Date  . HERNIA REPAIR    . knee arthorscopy      FAMHx:  Family History  Problem Relation Age of Onset  . Hypertension Mother        Living, currently 84 years old  . Cancer Father     SOCHx:   reports that she quit smoking about 10 months ago. Her smoking use included cigarettes. She has a 40.00 pack-year smoking history. She has never used smokeless tobacco. She reports that she drinks alcohol. She reports that she has current or past drug history. Drug: "Crack" cocaine.  ALLERGIES:  Allergies  Allergen Reactions  . Amoxicillin     Has patient had a PCN reaction causing immediate  rash, facial/tongue/throat swelling, SOB or lightheadedness with hypotension: yes Has patient had a PCN reaction causing severe rash involving mucus membranes or skin necrosis: no Has patient had a PCN reaction that required hospitalization: yes  Has patient had a PCN reaction occurring within the last 10 years: yes, 08/29/16 If all of the above answers are "NO", then may proceed with Cephalosporin use.   Marland Kitchen Lisinopril Cough  . Latex Rash    ROS: Pertinent items noted in HPI and remainder of comprehensive ROS otherwise negative.  HOME MEDS: Current Outpatient Medications on File Prior to Visit  Medication Sig  Dispense Refill  . acetaminophen (TYLENOL) 325 MG tablet Take 325 mg by mouth daily as needed for moderate pain.    Marland Kitchen aspirin EC 81 MG tablet Take 81 mg by mouth daily.    . carvedilol (COREG) 3.125 MG tablet Take 1 tablet (3.125 mg total) by mouth 2 (two) times daily with a meal. 60 tablet 0  . cyclobenzaprine (FLEXERIL) 10 MG tablet Take 1 tablet (10 mg total) by mouth at bedtime. 14 tablet 0  . diclofenac sodium (VOLTAREN) 1 % GEL Apply 4 g topically 4 (four) times daily. 2 Tube 3  . diphenhydrAMINE (BENADRYL) 25 MG tablet Take 1 tablet (25 mg total) by mouth every 6 (six) hours as needed. 20 tablet 0  . famotidine (PEPCID) 20 MG tablet Take 1 tablet (20 mg total) by mouth 2 (two) times daily. 60 tablet 11  . furosemide (LASIX) 20 MG tablet Take 1 tablet (20 mg total) by mouth daily. 30 tablet 0  . hydroxypropyl methylcellulose / hypromellose (ISOPTO TEARS / GONIOVISC) 2.5 % ophthalmic solution Place 1 drop into both eyes 2 (two) times daily.    Marland Kitchen ibuprofen (ADVIL,MOTRIN) 800 MG tablet Take 1 tablet (800 mg total) by mouth every 8 (eight) hours as needed. 90 tablet 3  . ipratropium (ATROVENT) 0.06 % nasal spray Place 2 sprays into both nostrils 4 (four) times daily. For allergies 30 mL 0  . levothyroxine (SYNTHROID, LEVOTHROID) 75 MCG tablet Take 1 tablet (75 mcg total) by mouth every morning. 30 tablet 0  . loratadine (CLARITIN) 10 MG tablet Take 1 tablet (10 mg total) by mouth daily. (This medicine may be purchased from over there counter at yr local pharmacy): For allergies 30 tablet 0  . losartan (COZAAR) 50 MG tablet Take 1 tablet (50 mg total) by mouth daily. 30 tablet 11  . meloxicam (MOBIC) 7.5 MG tablet Take 1 tablet (7.5 mg total) by mouth daily. 20 tablet 0   No current facility-administered medications on file prior to visit.     LABS/IMAGING: No results found for this or any previous visit (from the past 48 hour(s)). No results found.  LIPID PANEL:    Component Value  Date/Time   CHOL 238 (H) 09/22/2012 1333   TRIG 74 04/19/2017 2332   HDL 47 09/22/2012 1333   CHOLHDL 5.1 09/22/2012 1333   VLDL 47 (H) 09/22/2012 1333   LDLCALC 144 (H) 09/22/2012 1333     WEIGHTS: Wt Readings from Last 3 Encounters:  03/09/18 250 lb (113.4 kg)  09/07/17 150 lb (68 kg)  05/31/17 238 lb 9.6 oz (108.2 kg)    VITALS: BP (!) 153/94   Pulse 75   Ht 5\' 8"  (1.727 m)   Wt 250 lb (113.4 kg)   LMP 07/21/2014 Comment: current  BMI 38.01 kg/m   EXAM: General appearance: alert, no distress and moderately obese Neck: no carotid bruit, no  JVD and thyroid not enlarged, symmetric, no tenderness/mass/nodules Lungs: clear to auscultation bilaterally Heart: regular rate and rhythm, S1, S2 normal, no murmur, click, rub or gallop Abdomen: soft, non-tender; bowel sounds normal; no masses,  no organomegaly Extremities: extremities normal, atraumatic, no cyanosis or edema Pulses: 2+ and symmetric Skin: Skin color, texture, turgor normal. No rashes or lesions Neurologic: Grossly normal Psych: Pleasant  EKG: Deferred  ASSESSMENT: 1. Acute systolic congestive heart failure-LVEF 35-40% (2018) -improved to 55 to 60% (2019) 2. History influenza A pneumonia 3. History of polysubstance abuse - abstinent 4. Hypertension 5. Fatigue, snoring, obesity  PLAN: 1.   Jamie Hicks has significantly improved her lifestyle and stop polysubstance abuse.  Her EF has improved and normalized.  Blood pressure is somewhat elevated today however we do not know what her home numbers are.  I have encouraged her to get a blood pressure cuff and monitor it further.  She is concerned about her snoring, recent fatigue and weight which could be risk factors for sleep apnea in the setting of hypertension and cardiomyopathy.  She is never had a sleep study and will refer her for that today.  Plan follow-up with me afterwards in about 2 months and we will recheck her blood pressure readings at home at that  time.  Pixie Casino, MD, [facility], [place] Director of the Advanced Lipid Disorders &  Cardiovascular Risk Reduction Clinic Attending Cardiologist  Direct Dial: [phone]  Fax: [phone]  Website:  [email or web address]  Nadean Corwin Deric Bocock 03/09/2018, 10:18 AM

## 2018-03-09 NOTE — Patient Instructions (Addendum)
Your physician has recommended that you have a sleep study. This test records several body functions during sleep, including: brain activity, eye movement, oxygen and carbon dioxide blood levels, heart rate and rhythm, breathing rate and rhythm, the flow of air through your mouth and nose, snoring, body muscle movements, and chest and belly movement. -- this will be pre-certified with your insurance company first and then you will be called to schedule an appointment at either [facility] or a home sleep study -- Barry Brunner is our office sleep coordinator  Your physician recommends that you schedule a follow-up appointment in: 6-8 weeks with Dr. Debara Pickett.    Please obtain a blood pressure cuff if you do not already have one. An ARM CUFF is preferred over a wrist cuff. Omron is a good brand.  HOW TO TAKE YOUR BLOOD PRESSURE:  Rest 5 minutes before taking your blood pressure.  Don't smoke or drink caffeinated beverages for at least 30 minutes before.  Take your blood pressure before (not after) you eat.  Sit comfortably with your back supported and both feet on the floor (don't cross your legs).  Elevate your arm to heart level on a table or a desk.  Use the proper sized cuff. It should fit smoothly and snugly around your bare upper arm. There should be enough room to slip a fingertip under the cuff. The bottom edge of the cuff should be 1 inch above the crease of the elbow.  Ideally, take 3 measurements at one sitting and record the average.

## 2018-03-10 ENCOUNTER — Telehealth: Payer: Self-pay | Admitting: Internal Medicine

## 2018-03-10 ENCOUNTER — Telehealth: Payer: Self-pay | Admitting: *Deleted

## 2018-03-10 NOTE — Telephone Encounter (Signed)
° °  Patient wants to know if Dr Debara Pickett recommends getting  a flu shot Patient also wants to discuss "heart medication", she wants to confirm which medication is specifically for her heart. Please call

## 2018-03-10 NOTE — Telephone Encounter (Signed)
-----   Message from Roland Earl sent at 03/09/2018 10:38 AM EDT ----- Regarding: Sleep Study Ordered by Dr. Debara Pickett

## 2018-03-10 NOTE — Telephone Encounter (Signed)
Returned call to patient advised to take flu vaccine.Advised carvedilol helps heart and B/P.

## 2018-03-10 NOTE — Telephone Encounter (Signed)
PA request for sleep study submitted to [facility].  Per web portal no PA is required. Decision [id number].

## 2018-03-13 ENCOUNTER — Telehealth: Payer: Self-pay | Admitting: *Deleted

## 2018-03-13 NOTE — Telephone Encounter (Signed)
Called patient and notified her of her sleep study appointment scheduled on 03/21/18.

## 2018-03-13 NOTE — Telephone Encounter (Signed)
-----   Message from Roland Earl sent at 03/09/2018 10:38 AM EDT ----- Regarding: Sleep Study Ordered by Dr. Debara Pickett

## 2018-03-21 ENCOUNTER — Ambulatory Visit (HOSPITAL_BASED_OUTPATIENT_CLINIC_OR_DEPARTMENT_OTHER): Payer: 59 | Attending: Internal Medicine | Admitting: Cardiovascular Disease

## 2018-03-21 VITALS — Ht 68.0 in | Wt 250.0 lb

## 2018-03-21 DIAGNOSIS — G4733 Obstructive sleep apnea (adult) (pediatric): Secondary | ICD-10-CM | POA: Insufficient documentation

## 2018-03-21 DIAGNOSIS — R400 Somnolence: Secondary | ICD-10-CM | POA: Diagnosis not present

## 2018-03-21 DIAGNOSIS — R0683 Snoring: Secondary | ICD-10-CM

## 2018-04-09 ENCOUNTER — Encounter (HOSPITAL_BASED_OUTPATIENT_CLINIC_OR_DEPARTMENT_OTHER): Payer: Self-pay | Admitting: Cardiovascular Disease

## 2018-04-09 NOTE — Procedures (Signed)
Patient Name: Jamie Hicks, Radin Date: 03/21/2018 Gender: Female D.O.B: [birth year] Age (years): 62 Referring Provider: Nadean Corwin Hilty Height (inches): 68 Interpreting Physician: Shelva Majestic MD, ABSM Weight (lbs): 250 RPSGT: Carolin Coy BMI: 38 MRN: [id number] Neck Size: 15.50  CLINICAL INFORMATION Sleep Study Type: Split Night CPAP  Indication for sleep study: Excessive Daytime Sleepiness, Hypertension, Snoring  Epworth Sleepiness Score: 3  SLEEP STUDY TECHNIQUE As per the AASM Manual for the Scoring of Sleep and Associated Events v2.3 (April 2016) with a hypopnea requiring 4% desaturations.  The channels recorded and monitored were frontal, central and occipital EEG, electrooculogram (EOG), submentalis EMG (chin), nasal and oral airflow, thoracic and abdominal wall motion, anterior tibialis EMG, snore microphone, electrocardiogram, and pulse oximetry. Continuous positive airway pressure (CPAP) was initiated when the patient met split night criteria and was titrated according to treat sleep-disordered breathing.  MEDICATIONS     acetaminophen (TYLENOL) 325 MG tablet             aspirin EC 81 MG tablet         carvedilol (COREG) 3.125 MG tablet (Expired)         cyclobenzaprine (FLEXERIL) 10 MG tablet         diclofenac sodium (VOLTAREN) 1 % GEL         diphenhydrAMINE (BENADRYL) 25 MG tablet         famotidine (PEPCID) 20 MG tablet         furosemide (LASIX) 20 MG tablet (Expired)         hydroxypropyl methylcellulose / hypromellose (ISOPTO TEARS / GONIOVISC) 2.5 % ophthalmic solution         ibuprofen (ADVIL,MOTRIN) 800 MG tablet         ipratropium (ATROVENT) 0.06 % nasal spray         levothyroxine (SYNTHROID, LEVOTHROID) 75 MCG tablet (Expired)         loratadine (CLARITIN) 10 MG tablet (Expired)         losartan (COZAAR) 50 MG tablet         meloxicam (MOBIC) 7.5 MG tablet      Medications self-administered by patient taken the night of the study :  CARVEDILOL  RESPIRATORY PARAMETERS Diagnostic Total AHI (/hr): 64.3 RDI (/hr): 65.3 OA Index (/hr): 6.8 CA Index (/hr): 1.0 REM AHI (/hr): 48.0 NREM AHI (/hr): 64.7 Supine AHI (/hr): 120.0 Non-supine AHI (/hr): 64.1 Min O2 Sat (%): 75.0 Mean O2 (%): 90.7 Time below 88% (min): 20.2   Titration Optimal Pressure (cm): 13 AHI at Optimal Pressure (/hr): 0.0 Min O2 at Optimal Pressure (%): 95.0 Supine % at Optimal (%): 7 Sleep % at Optimal (%): 83   SLEEP ARCHITECTURE The recording time for the entire night was 366.7 minutes.  During a baseline period of 145.0 minutes, the patient slept for 124.0 minutes in REM and nonREM, yielding a sleep efficiency of 85.6%%. Sleep onset after lights out was 11.9 minutes with a REM latency of 17.0 minutes. The patient spent 14.9%% of the night in stage N1 sleep, 83.1%% in stage N2 sleep, 0.0%% in stage N3 and 2% in REM.  During the titration period of 213.4 minutes, the patient slept for 167.0 minutes in REM and nonREM, yielding a sleep efficiency of 78.3%%. Sleep onset after CPAP initiation was 9.1 minutes with a REM latency of 54.0 minutes. The patient spent 12.0%% of the night in stage N1 sleep, 29.6%% in stage N2 sleep, 0.0%% in stage N3 and 58.4% in REM.  CARDIAC DATA The 2 lead EKG demonstrated sinus rhythm. The mean heart rate was 100.0 beats per minute. Other EKG findings include: PVCs.  LEG MOVEMENT DATA The total Periodic Limb Movements of Sleep (PLMS) were 0. The PLMS index was 0.0 .  IMPRESSIONS - Severe obstructive sleep apnea occurred during the diagnostic portion of the study (AHI 64.3/hour). CPAP was initiated at 6 cm and was titrated to 13 cm of water.  The optimal PAP pressure was selected for this patient ( 13 cm of water) - No significant central sleep apnea occurred during the diagnostic portion of the study (CAI 1.0/hour). - Severe oxygen desaturation to a nadir of 75%. - The patient snored with moderate snoring volume during the  diagnostic portion of the study. - EKG findings include PVCs. - Clinically significant periodic limb movements did not occur during sleep.  DIAGNOSIS - Obstructive Sleep Apnea (327.23 [G47.33 ICD-10])  RECOMMENDATIONS - Recommend an initial rial of CPAP therapy with EPR of 3 at 13 cm H2O with heated humidification.  A medium size Fisher&Paykel Full Face Mask Simplus mask was used for the titration. - Efforts should be made to optimze nasal and oropharyngeal patency. - Avoid alcohol, sedatives and other CNS depressants that may worsen sleep apnea and disrupt normal sleep architecture. - Sleep hygiene should be reviewed to assess factors that may improve sleep quality. - Weight management and regular exercise should be initiated or continued. - Recommend a download in 30 days and sleep clinic evaluation after 4 weeks of therapy  [Electronically signed] 04/09/2018 11:01 AM  Shelva Majestic MD, [facility], ABSM Diplomate, American Board of Sleep Medicine   NPI: [id number]  [place] PH: [phone]   FX: [phone] [place]

## 2018-04-10 ENCOUNTER — Telehealth: Payer: Self-pay | Admitting: *Deleted

## 2018-04-10 NOTE — Telephone Encounter (Signed)
-----   Message from Troy Sine, MD sent at 04/09/2018 11:08 AM EDT ----- Mariann Laster,  Please notify pt and set up with DME for CPAP.

## 2018-04-10 NOTE — Telephone Encounter (Signed)
Patient notified of sleep study results and recommendations. CPAP referral sent to [facility].

## 2018-04-20 ENCOUNTER — Ambulatory Visit (INDEPENDENT_AMBULATORY_CARE_PROVIDER_SITE_OTHER): Payer: 59 | Admitting: Internal Medicine

## 2018-04-20 ENCOUNTER — Encounter: Payer: Self-pay | Admitting: Internal Medicine

## 2018-04-20 VITALS — BP 154/96 | HR 58 | Ht 68.0 in | Wt 253.0 lb

## 2018-04-20 DIAGNOSIS — G4733 Obstructive sleep apnea (adult) (pediatric): Secondary | ICD-10-CM

## 2018-04-20 DIAGNOSIS — Z9989 Dependence on other enabling machines and devices: Secondary | ICD-10-CM

## 2018-04-20 DIAGNOSIS — I10 Essential (primary) hypertension: Secondary | ICD-10-CM | POA: Diagnosis not present

## 2018-04-20 MED ORDER — LOSARTAN POTASSIUM 100 MG PO TABS: 100.0000 mg | ORAL_TABLET | Freq: Every day | ORAL | 3 refills | Status: DC

## 2018-04-20 NOTE — Progress Notes (Signed)
OFFICE NOTE  Chief Complaint:  Follow-up studies  Primary Care Physician: Nolene Ebbs, MD  HPI:  Jamie Hicks is a 62 y.o. female with a past medial history significant for etoh and cocaine abuse, HTN and recent admission for Influenza A pneumonia - this required intubation and mechanical ventilation and was complicated by the development of acute systolic congestive heart failure with LVEF 35-40%.  She was placed on appropriate heart failure medications and discharged.  She returns today for follow-up.  She reports improvement in her breathing since discharge.  She was recently in the hospital for chest discomfort but was diagnosed with reflux and her symptoms improved on famotidine.  She currently denies any chest pain.  She says she is completely stopped alcohol and tobacco abuse as well as illicit drugs.  She says she wants to lead a healthier cleaner lifestyle.  Her only complaints are for some nasal congestion related to her apartment.  She says it is an old building and she is looking at moving to a new apartment starting in January.  She reports compliance with her medications although has not started aspirin.  Blood pressure was elevated today 146/88 which did not change after 10 minutes with a repeat blood pressure check.   12/2017  Jamie Hicks returns today for follow-up.  She reports in the interim she has given up alcohol and smoking.  She is committed to living a healthier life.  She is followed up with Dr. Jeanie Cooks.  She has been compliant with her medications.  I recently increased her losartan up to 50 mg and her blood pressure today is 134/80.  Weight is been stable.  She denies any worsening swelling, shortness of breath or chest pain.  03/09/2018  Jamie Hicks is seen today in follow-up.  She just turned 62 and overall she feels great.  She has been abstinent for alcohol for almost a year and will get her 1 year sobriety chip from AA.  She says that recently she  has had some fatigue and wonders because she snores loudly whether she could have sleep apnea.  She is certainly overweight with a thick neck and has witnessed snoring and nonrestorative sleep.  Fortunately, I can report that her EF has improved now up to 55 to 60%.  She will need to remain on heart failure medicines long-term.  Blood pressure was little elevated today and should be monitored at home.  She does not currently have a cough and I recommended Omron.  We will need to monitor home blood pressures and see if her meds should be adjusted.  04/20/2018  Jamie Hicks is seen today in follow-up.  She is very excited today as she is now 1 year sober.  She recently had a sleep study which showed severe sleep apnea with an AHI of 64.3/h.  He was started on CPAP therapy and she received her machine today.  She hopes to start it tonight.  I suspect she will have improvement in her symptoms.  Her blood pressure remains elevated.  This may improve over time with CPAP therapy as well as weight loss, however for now she will need more medication.  PMHx:  Past Medical History:  Diagnosis Date  . Back pain   . Hypertension   . Thyroid disease     Past Surgical History:  Procedure Laterality Date  . HERNIA REPAIR    . knee arthorscopy      FAMHx:  Family History  Problem Relation Age  of Onset  . Hypertension Mother        Living, currently 49 years old  . Cancer Father     SOCHx:   reports that she quit smoking about a year ago. Her smoking use included cigarettes. She has a 40.00 pack-year smoking history. She has never used smokeless tobacco. She reports that she drinks alcohol. She reports that she has current or past drug history. Drug: "Crack" cocaine.  ALLERGIES:  Allergies  Allergen Reactions  . Amoxicillin     Has patient had a PCN reaction causing immediate rash, facial/tongue/throat swelling, SOB or lightheadedness with hypotension: yes Has patient had a PCN reaction causing  severe rash involving mucus membranes or skin necrosis: no Has patient had a PCN reaction that required hospitalization: yes  Has patient had a PCN reaction occurring within the last 10 years: yes, 08/29/16 If all of the above answers are "NO", then may proceed with Cephalosporin use.   Marland Kitchen Lisinopril Cough  . Latex Rash    ROS: Pertinent items noted in HPI and remainder of comprehensive ROS otherwise negative.  HOME MEDS: Current Outpatient Medications on File Prior to Visit  Medication Sig Dispense Refill  . acetaminophen (TYLENOL) 325 MG tablet Take 325 mg by mouth daily as needed for moderate pain.    Marland Kitchen aspirin EC 81 MG tablet Take 81 mg by mouth daily.    . carvedilol (COREG) 3.125 MG tablet Take 1 tablet (3.125 mg total) by mouth 2 (two) times daily with a meal. 60 tablet 0  . cyclobenzaprine (FLEXERIL) 10 MG tablet Take 1 tablet (10 mg total) by mouth at bedtime. 14 tablet 0  . diclofenac sodium (VOLTAREN) 1 % GEL Apply 4 g topically 4 (four) times daily. 2 Tube 3  . diphenhydrAMINE (BENADRYL) 25 MG tablet Take 1 tablet (25 mg total) by mouth every 6 (six) hours as needed. 20 tablet 0  . famotidine (PEPCID) 20 MG tablet Take 1 tablet (20 mg total) by mouth 2 (two) times daily. 60 tablet 11  . hydroxypropyl methylcellulose / hypromellose (ISOPTO TEARS / GONIOVISC) 2.5 % ophthalmic solution Place 1 drop into both eyes 2 (two) times daily.    Marland Kitchen ibuprofen (ADVIL,MOTRIN) 800 MG tablet Take 1 tablet (800 mg total) by mouth every 8 (eight) hours as needed. 90 tablet 3  . ipratropium (ATROVENT) 0.06 % nasal spray Place 2 sprays into both nostrils 4 (four) times daily. For allergies 30 mL 0  . losartan (COZAAR) 50 MG tablet Take 1 tablet (50 mg total) by mouth daily. 30 tablet 11  . meloxicam (MOBIC) 7.5 MG tablet Take 1 tablet (7.5 mg total) by mouth daily. 20 tablet 0  . furosemide (LASIX) 20 MG tablet Take 1 tablet (20 mg total) by mouth daily. 30 tablet 0  . levothyroxine (SYNTHROID,  LEVOTHROID) 75 MCG tablet Take 1 tablet (75 mcg total) by mouth every morning. 30 tablet 0  . loratadine (CLARITIN) 10 MG tablet Take 1 tablet (10 mg total) by mouth daily. (This medicine may be purchased from over there counter at yr local pharmacy): For allergies 30 tablet 0   No current facility-administered medications on file prior to visit.     LABS/IMAGING: No results found for this or any previous visit (from the past 48 hour(s)). No results found.  LIPID PANEL:    Component Value Date/Time   CHOL 238 (H) 09/22/2012 1333   TRIG 74 04/19/2017 2332   HDL 47 09/22/2012 1333   CHOLHDL 5.1 09/22/2012  1333   VLDL 47 (H) 09/22/2012 1333   LDLCALC 144 (H) 09/22/2012 1333     WEIGHTS: Wt Readings from Last 3 Encounters:  04/20/18 253 lb (114.8 kg)  03/21/18 250 lb (113.4 kg)  03/09/18 250 lb (113.4 kg)    VITALS: BP (!) 154/96   Pulse (!) 58   Ht 5\' 8"  (1.727 m)   Wt 253 lb (114.8 kg)   LMP 07/21/2014 Comment: current  BMI 38.47 kg/m   EXAM: General appearance: alert, no distress and moderately obese Neck: no carotid bruit, no JVD and thyroid not enlarged, symmetric, no tenderness/mass/nodules Lungs: clear to auscultation bilaterally Heart: regular rate and rhythm, S1, S2 normal, no murmur, click, rub or gallop Abdomen: soft, non-tender; bowel sounds normal; no masses,  no organomegaly Extremities: extremities normal, atraumatic, no cyanosis or edema Pulses: 2+ and symmetric Skin: Skin color, texture, turgor normal. No rashes or lesions Neurologic: Grossly normal Psych: Pleasant  EKG: Sinus bradycardia 58-personally reviewed  ASSESSMENT: 1. Acute systolic congestive heart failure-LVEF 35-40% (2018) -improved to 55 to 60% (2019) 2. History influenza A pneumonia 3. History of polysubstance abuse - abstinent 4. Hypertension 5. OSA on CPAP  PLAN: 1.   Jamie Hicks was found to have severe obstructive sleep apnea with greater than 60 AHI.  She is now going to  start CPAP therapy.  Her blood pressure remains elevated.  We will go ahead and increase her losartan further from 50 to 100 mg daily.  Plan to see her back in about 3 months.  Pixie Casino, MD, [facility], [place] Director of the Advanced Lipid Disorders &  Cardiovascular Risk Reduction Clinic Attending Cardiologist  Direct Dial: [phone]  Fax: [phone]  Website:  [email or web address] Hubbard [place] 04/20/2018, 1:49 PM

## 2018-04-20 NOTE — Patient Instructions (Signed)
Medication Instructions:  INCREASE losartan to 100mg  daily If you need a refill on your cardiac medications before your next appointment, please call your pharmacy.   Follow-Up: At [facility], you and your health needs are our priority.  As part of our continuing mission to provide you with exceptional heart care, we have created designated Provider Care Teams.  These Care Teams include your primary Cardiologist (physician) and Advanced Practice Providers (APPs -  Physician Assistants and Nurse Practitioners) who all work together to provide you with the care you need, when you need it. You will need a follow up appointment in 3 months. You may see Dr Debara Pickett one of the following Advanced Practice Providers on your designated Care Team: Almyra Deforest, [place] . Fabian Sharp, PA-C  Any Other Special Instructions Will Be Listed Below (If Applicable).

## 2018-05-02 ENCOUNTER — Telehealth: Payer: Self-pay | Admitting: Internal Medicine

## 2018-05-02 NOTE — Telephone Encounter (Signed)
Returned call to patient she stated she wanted to make sure last time she saw Dr.Hilty her heart checked out normal.Stated her heart feels tired at times.No chest pain. No sob.After reviewing chart Dr.Hilty's 04/20/18 office note heart regular rate and rhythm no murmur.Advised I will send message to Dr.Hilty.

## 2018-05-02 NOTE — Telephone Encounter (Signed)
° °  Patient states she would like to nurse to call her to discuss her last office visit. She has questions about care and medications

## 2018-05-02 NOTE — Telephone Encounter (Signed)
Thanks [employer]. Heart checked out ok.  Dr. Lemmie Evens

## 2018-05-17 ENCOUNTER — Emergency Department (HOSPITAL_COMMUNITY)
Admission: EM | Admit: 2018-05-17 | Discharge: 2018-05-17 | Disposition: A | Discharge status: Home / Self Care | Payer: 59 | Attending: Emergency Medicine | Admitting: Emergency Medicine

## 2018-05-17 ENCOUNTER — Emergency Department (HOSPITAL_COMMUNITY): Payer: 59

## 2018-05-17 ENCOUNTER — Other Ambulatory Visit: Payer: Self-pay

## 2018-05-17 ENCOUNTER — Encounter (HOSPITAL_COMMUNITY): Payer: Self-pay | Admitting: Emergency Medicine

## 2018-05-17 DIAGNOSIS — F1420 Cocaine dependence, uncomplicated: Secondary | ICD-10-CM | POA: Diagnosis not present

## 2018-05-17 DIAGNOSIS — Z91040 Latex allergy status: Secondary | ICD-10-CM | POA: Diagnosis not present

## 2018-05-17 DIAGNOSIS — Z79899 Other long term (current) drug therapy: Secondary | ICD-10-CM | POA: Insufficient documentation

## 2018-05-17 DIAGNOSIS — I110 Hypertensive heart disease with heart failure: Secondary | ICD-10-CM | POA: Insufficient documentation

## 2018-05-17 DIAGNOSIS — R05 Cough: Secondary | ICD-10-CM | POA: Diagnosis not present

## 2018-05-17 DIAGNOSIS — I5020 Unspecified systolic (congestive) heart failure: Secondary | ICD-10-CM | POA: Insufficient documentation

## 2018-05-17 DIAGNOSIS — J020 Streptococcal pharyngitis: Secondary | ICD-10-CM | POA: Diagnosis not present

## 2018-05-17 DIAGNOSIS — J029 Acute pharyngitis, unspecified: Secondary | ICD-10-CM | POA: Diagnosis present

## 2018-05-17 DIAGNOSIS — R059 Cough, unspecified: Secondary | ICD-10-CM

## 2018-05-17 DIAGNOSIS — Z7982 Long term (current) use of aspirin: Secondary | ICD-10-CM | POA: Diagnosis not present

## 2018-05-17 DIAGNOSIS — Z87891 Personal history of nicotine dependence: Secondary | ICD-10-CM | POA: Diagnosis not present

## 2018-05-17 DIAGNOSIS — E039 Hypothyroidism, unspecified: Secondary | ICD-10-CM | POA: Insufficient documentation

## 2018-05-17 LAB — GROUP A STREP BY PCR: Group A Strep by PCR: DETECTED — AB

## 2018-05-17 MED ORDER — DEXAMETHASONE 4 MG PO TABS
10.0000 mg | ORAL_TABLET | Freq: Once | ORAL | Status: AC
  Administered 2018-05-17: 10 mg via ORAL
  Filled 2018-05-17: qty 2

## 2018-05-17 MED ORDER — AZITHROMYCIN 250 MG PO TABS: ORAL_TABLET | ORAL | 0 refills | Status: DC

## 2018-05-17 NOTE — Discharge Instructions (Signed)
Take tylenol or Advil as needed for fever and pain. Follow up with your doctor or return here for worsening symptoms.

## 2018-05-17 NOTE — ED Provider Notes (Signed)
[place] DEPT Provider Note   CSN: [id number] Arrival date & time: 05/17/18  1057     History   Chief Complaint Chief Complaint  Patient presents with  . Sore Throat  . Cough    HPI Jamie Hicks is a 62 y.o. female with multiple medical problems as listed in patient active problem list presents to the ED with c/o sore throat and occasional dry cough. Patient reports symptoms started 5 days ago. Exposure to children with positive strep.  Patient did get flu shot this year.   The history is provided by the patient. No language interpreter was used.  Sore Throat  This is a new problem. The current episode started more than 2 days ago. The problem occurs constantly. The problem has been gradually worsening. Associated symptoms include headaches. Pertinent negatives include no abdominal pain.  Cough  This is a new problem. The current episode started more than 2 days ago. The cough is non-productive. The maximum temperature recorded prior to her arrival was 100 to 100.9 F. Associated symptoms include chills, headaches, rhinorrhea, sore throat and myalgias. Pertinent negatives include no ear congestion and no ear pain. She is not a smoker. Her past medical history is significant for pneumonia.    Past Medical History:  Diagnosis Date  . Back pain   . Hypertension   . Thyroid disease     Patient Active Problem List   Diagnosis Date Noted  . Snoring 03/09/2018  . Daytime sleepiness 03/09/2018  . Effusion, left knee 10/13/2017  . Chronic pain of left knee 09/26/2017  . Unilateral primary osteoarthritis, left knee 09/26/2017  . Unilateral primary osteoarthritis, right knee 09/26/2017  . Chronic pain of right knee 09/26/2017  . Polysubstance abuse ([place]) 05/31/2017  . Acute systolic (congestive) heart failure ([place]) 04/25/2017  . Acute respiratory failure with hypoxia ([place])   . ARDS (adult respiratory distress syndrome) ([place])   . Acute  respiratory distress   . Community acquired pneumonia of left upper lobe of lung ([place])   . Hypoxemia   . Influenza A 04/18/2017  . Left upper lobe pneumonia ([place]) 04/18/2017  . Obesity, Class III, BMI 40-49.9 (morbid obesity) ([place]) 04/18/2017  . Thyroid activity decreased   . Cocaine dependence with cocaine-induced mood disorder ([place]) 07/22/2014  . Major depressive disorder, recurrent, severe without psychotic features ([place])   . Substance induced mood disorder ([place]) 07/19/2014  . Suicidal ideation   . Homicidal ideation   . Osteoarthritis 09/24/2012  . Prediabetes 09/24/2012  . Dyslipidemia 09/24/2012  . Vitamin D insufficiency 09/24/2012  . Hypothyroidism 09/22/2012  . Bilateral chronic knee pain 09/22/2012  . Smoker 09/22/2012  . Essential hypertension 09/22/2012    Past Surgical History:  Procedure Laterality Date  . HERNIA REPAIR    . knee arthorscopy       OB History   None      Home Medications    Prior to Admission medications   Medication Sig Start Date End Date Taking? Authorizing Provider  acetaminophen (TYLENOL) 325 MG tablet Take 325 mg by mouth daily as needed for moderate pain.    [provider]  aspirin EC 81 MG tablet Take 81 mg by mouth daily.    [provider]  azithromycin (ZITHROMAX Z-PAK) 250 MG tablet Take the first 2 tablets together and then one tablet PO daily 05/17/18   Ashley Murrain, NP  carvedilol (COREG) 3.125 MG tablet Take 1 tablet (3.125 mg total) by mouth 2 (  two) times daily with a meal. 06/05/17 04/20/18  Yu, Amy V, PA-C  cyclobenzaprine (FLEXERIL) 10 MG tablet Take 1 tablet (10 mg total) by mouth at bedtime. 06/05/17   Tasia Catchings, Amy V, PA-C  diclofenac sodium (VOLTAREN) 1 % GEL Apply 4 g topically 4 (four) times daily. 07/17/17   Bjorn Pippin, PA-C  diphenhydrAMINE (BENADRYL) 25 MG tablet Take 1 tablet (25 mg total) by mouth every 6 (six) hours as needed. 08/29/16   Duffy Bruce, MD  famotidine (PEPCID) 20 MG tablet Take  1 tablet (20 mg total) by mouth 2 (two) times daily. 07/05/17   Hilty, Nadean Corwin, MD  furosemide (LASIX) 20 MG tablet Take 1 tablet (20 mg total) by mouth daily. 06/05/17 03/09/18  Tasia Catchings, Amy V, PA-C  hydroxypropyl methylcellulose / hypromellose (ISOPTO TEARS / GONIOVISC) 2.5 % ophthalmic solution Place 1 drop into both eyes 2 (two) times daily.    [provider]  ibuprofen (ADVIL,MOTRIN) 800 MG tablet Take 1 tablet (800 mg total) by mouth every 8 (eight) hours as needed. 03/08/18   Mcarthur Rossetti, MD  ipratropium (ATROVENT) 0.06 % nasal spray Place 2 sprays into both nostrils 4 (four) times daily. For allergies 06/05/17   Ok Edwards, PA-C  levothyroxine (SYNTHROID, LEVOTHROID) 75 MCG tablet Take 1 tablet (75 mcg total) by mouth every morning. 06/05/17 03/09/18  Ok Edwards, PA-C  loratadine (CLARITIN) 10 MG tablet Take 1 tablet (10 mg total) by mouth daily. (This medicine may be purchased from over there counter at yr local pharmacy): For allergies 06/05/17 03/09/18  Ok Edwards, PA-C  losartan (COZAAR) 100 MG tablet Take 1 tablet (100 mg total) by mouth daily. 04/20/18   Hilty, Nadean Corwin, MD  meloxicam (MOBIC) 7.5 MG tablet Take 1 tablet (7.5 mg total) by mouth daily. 07/18/17   Zigmund Gottron, NP    Family History Family History  Problem Relation Age of Onset  . Hypertension Mother        Living, currently 60 years old  . Cancer Father     Social History Social History   Tobacco Use  . Smoking status: Former Smoker    Packs/day: 1.00    Years: 40.00    Pack years: 40.00    Types: Cigarettes    Last attempt to quit: 05/01/2017    Years since quitting: 1.0  . Smokeless tobacco: Never Used  Substance Use Topics  . Alcohol use: Yes    Comment: 1+ pints of Liquor per day  . Drug use: Yes    Types: "Crack" cocaine     Allergies   Amoxicillin; Lisinopril; and Latex   Review of Systems Review of Systems  Constitutional: Positive for chills.  HENT: Positive for  congestion, rhinorrhea and sore throat. Negative for ear pain.   Respiratory: Positive for cough.   Gastrointestinal: Negative for abdominal pain, nausea and vomiting.  Genitourinary: Negative for dysuria, frequency and urgency.  Musculoskeletal: Positive for arthralgias and myalgias.  Skin: Negative for rash.  Neurological: Positive for headaches. Negative for syncope.  Hematological: Negative for adenopathy.  Psychiatric/Behavioral: Negative for confusion.     Physical Exam Updated Vital Signs BP (!) 157/86 (BP Location: Left Arm)   Pulse 77   Temp 99.5 F (37.5 C) (Oral)   Resp 20   Ht 5\' 8"  (1.727 m)   Wt 113.4 kg   LMP 07/21/2014 Comment: current  SpO2 99%   BMI 38.01 kg/m   Physical Exam  Constitutional: She appears  well-developed and well-nourished. No distress.  HENT:  Head: Normocephalic and atraumatic.  Right Ear: Tympanic membrane normal.  Left Ear: Tympanic membrane normal.  Mouth/Throat: Uvula is midline. Oropharyngeal exudate and posterior oropharyngeal erythema present.  Eyes: Conjunctivae and EOM are normal.  Neck: Neck supple.  Cardiovascular: Normal rate and regular rhythm.  Pulmonary/Chest: Effort normal. She has no wheezes. She has no rales. She exhibits no tenderness.  Abdominal: Soft. There is no tenderness.  Musculoskeletal: Normal range of motion.  Lymphadenopathy:    She has cervical adenopathy.  Neurological: She is alert.  Skin: Skin is warm and dry.  Psychiatric: She has a normal mood and affect.  Nursing note and vitals reviewed.    ED Treatments / Results  Labs (all labs ordered are listed, but only abnormal results are displayed) Labs Reviewed  GROUP A STREP BY PCR - Abnormal; Notable for the following components:      Result Value   Group A Strep by PCR DETECTED (*)    All other components within normal limits   Radiology Dg Chest 2 View  Result Date: 05/17/2018 CLINICAL DATA:  Cough. EXAM: CHEST - 2 VIEW COMPARISON:   Radiographs of May 17, 2017. FINDINGS: Stable cardiomegaly. No pneumothorax or pleural effusion is noted. Atherosclerosis of thoracic aorta is noted. Both lungs are clear. The visualized skeletal structures are unremarkable. IMPRESSION: No active cardiopulmonary disease. Aortic Atherosclerosis (ICD10-I70.0). Electronically Signed   By: Marijo Conception, M.D.   On: 05/17/2018 13:35    Procedures Procedures (including critical care time)  Medications Ordered in ED Medications - No data to display   Initial Impression / Assessment and Plan / ED Course  I have reviewed the triage vital signs and the nursing notes. Pt febrile with tonsillar exudate, cervical lymphadenopathy, & dysphagia; diagnosis of bacterial pharyngitis. Treated in the ED with steroids and Rx for Z-pack since she is allergic to Penicillin. Pt appears mildly dehydrated, discussed importance of water rehydration. Presentation non concerning for PTA or RPA. No trismus or uvula deviation. Specific return precautions discussed. Pt able to drink water in ED without difficulty with intact air way. Recommended PCP follow up.   Final Clinical Impressions(s) / ED Diagnoses   Final diagnoses:  Cough in adult  Strep pharyngitis    ED Discharge Orders         Ordered    azithromycin (ZITHROMAX Z-PAK) 250 MG tablet     05/17/18 1349           Debroah Baller Arthur, NP 05/17/18 1356    Tegeler, Gwenyth Allegra, MD 05/17/18 [phone]

## 2018-05-17 NOTE — ED Triage Notes (Signed)
Patient c/o cough, congestion, sore throat and body aches x5 days. Reports family has had similar sx.

## 2018-08-02 ENCOUNTER — Other Ambulatory Visit: Payer: Self-pay | Admitting: Internal Medicine

## 2018-08-02 NOTE — Telephone Encounter (Signed)
Rx(s) sent to pharmacy electronically.  

## 2018-08-03 ENCOUNTER — Encounter: Payer: Self-pay | Admitting: Internal Medicine

## 2018-08-03 ENCOUNTER — Ambulatory Visit (INDEPENDENT_AMBULATORY_CARE_PROVIDER_SITE_OTHER): Payer: 59 | Admitting: Internal Medicine

## 2018-08-03 VITALS — BP 122/74 | HR 72 | Ht 69.0 in | Wt 258.4 lb

## 2018-08-03 DIAGNOSIS — G4733 Obstructive sleep apnea (adult) (pediatric): Secondary | ICD-10-CM | POA: Diagnosis not present

## 2018-08-03 DIAGNOSIS — I5022 Chronic systolic (congestive) heart failure: Secondary | ICD-10-CM | POA: Diagnosis not present

## 2018-08-03 DIAGNOSIS — I10 Essential (primary) hypertension: Secondary | ICD-10-CM | POA: Diagnosis not present

## 2018-08-03 DIAGNOSIS — Z9989 Dependence on other enabling machines and devices: Secondary | ICD-10-CM

## 2018-08-03 NOTE — Patient Instructions (Signed)
Medication Instructions:  NO CHANGE If you need a refill on your cardiac medications before your next appointment, please call your pharmacy.   Lab work: If you have labs (blood work) drawn today and your tests are completely normal, you will receive your results only by: Marland Kitchen MyChart Message (if you have MyChart) OR . A paper copy in the mail If you have any lab test that is abnormal or we need to change your treatment, we will call you to review the results.  Follow-Up: At [facility], you and your health needs are our priority.  As part of our continuing mission to provide you with exceptional heart care, we have created designated Provider Care Teams.  These Care Teams include your primary Cardiologist (physician) and Advanced Practice Providers (APPs -  Physician Assistants and Nurse Practitioners) who all work together to provide you with the care you need, when you need it. You will need a follow up appointment in 12 months.  Please call our office 2 months in advance to schedule this appointment.  You may see DR HILTY or one of the following Advanced Practice Providers on your designated Care Team: Almyra Deforest, [place] . Fabian Sharp, PA-C

## 2018-08-03 NOTE — Progress Notes (Addendum)
OFFICE NOTE  Chief Complaint:  Follow-up studies  Primary Care Physician: Nolene Ebbs, MD  HPI:  Jamie Hicks is a 63 y.o. female with a past medial history significant for etoh and cocaine abuse, HTN and recent admission for Influenza A pneumonia - this required intubation and mechanical ventilation and was complicated by the development of acute systolic congestive heart failure with LVEF 35-40%.  She was placed on appropriate heart failure medications and discharged.  She returns today for follow-up.  She reports improvement in her breathing since discharge.  She was recently in the hospital for chest discomfort but was diagnosed with reflux and her symptoms improved on famotidine.  She currently denies any chest pain.  She says she is completely stopped alcohol and tobacco abuse as well as illicit drugs.  She says she wants to lead a healthier cleaner lifestyle.  Her only complaints are for some nasal congestion related to her apartment.  She says it is an old building and she is looking at moving to a new apartment starting in January.  She reports compliance with her medications although has not started aspirin.  Blood pressure was elevated today 146/88 which did not change after 10 minutes with a repeat blood pressure check.   12/2017  Jamie Hicks returns today for follow-up.  She reports in the interim she has given up alcohol and smoking.  She is committed to living a healthier life.  She is followed up with Dr. Jeanie Cooks.  She has been compliant with her medications.  I recently increased her losartan up to 50 mg and her blood pressure today is 134/80.  Weight is been stable.  She denies any worsening swelling, shortness of breath or chest pain.  03/09/2018  Jamie Hicks is seen today in follow-up.  She just turned 62 and overall she feels great.  She has been abstinent for alcohol for almost a year and will get her 1 year sobriety chip from AA.  She says that recently she  has had some fatigue and wonders because she snores loudly whether she could have sleep apnea.  She is certainly overweight with a thick neck and has witnessed snoring and nonrestorative sleep.  Fortunately, I can report that her EF has improved now up to 55 to 60%.  She will need to remain on heart failure medicines long-term.  Blood pressure was little elevated today and should be monitored at home.  She does not currently have a cough and I recommended Omron.  We will need to monitor home blood pressures and see if her meds should be adjusted.  04/20/2018  Jamie Hicks is seen today in follow-up.  She is very excited today as she is now 1 year sober.  She recently had a sleep study which showed severe sleep apnea with an AHI of 64.3/h.  He was started on CPAP therapy and she received her machine today.  She hopes to start it tonight.  I suspect she will have improvement in her symptoms.  Her blood pressure remains elevated.  This may improve over time with CPAP therapy as well as weight loss, however for now she will need more medication.  08/03/2018  Jamie Hicks is seen today in follow-up.  Is now been 3 months since I made some adjustments in her medications.  Her blood pressure now is much better controlled on higher dose losartan.  Today she is 122/74 recently during a home visit her blood pressure was 128/86.  She denies any  chest pain or worsening shortness of breath.  She is continue to work on weight loss.  She is not recently been compliant with her CPAP and was diagnosed with severe apnea with an AHI of 63 recently.  And not sure what the issue is with her equipment.  She is apparently being serviced by [employer].  We will reach out to our sleep coordinator to see if she needs another study or perhaps to contact [facility] to see if her equipment can be adjusted or replaced.  PMHx:  Past Medical History:  Diagnosis Date  . Back pain   . Hypertension   . Thyroid disease     Past Surgical  History:  Procedure Laterality Date  . HERNIA REPAIR    . knee arthorscopy      FAMHx:  Family History  Problem Relation Age of Onset  . Hypertension Mother        Living, currently 21 years old  . Cancer Father     SOCHx:   reports that she quit smoking about 15 months ago. Her smoking use included cigarettes. She has a 40.00 pack-year smoking history. She has never used smokeless tobacco. She reports current alcohol use. She reports current drug use. Drug: "Crack" cocaine.  ALLERGIES:  Allergies  Allergen Reactions  . Amoxicillin     Has patient had a PCN reaction causing immediate rash, facial/tongue/throat swelling, SOB or lightheadedness with hypotension: yes Has patient had a PCN reaction causing severe rash involving mucus membranes or skin necrosis: no Has patient had a PCN reaction that required hospitalization: yes  Has patient had a PCN reaction occurring within the last 10 years: yes, 08/29/16 If all of the above answers are "NO", then may proceed with Cephalosporin use.   Marland Kitchen Lisinopril Cough  . Latex Rash    ROS: Pertinent items noted in HPI and remainder of comprehensive ROS otherwise negative.  HOME MEDS: Current Outpatient Medications on File Prior to Visit  Medication Sig Dispense Refill  . acetaminophen (TYLENOL) 325 MG tablet Take 325 mg by mouth daily as needed for moderate pain.    Marland Kitchen aspirin EC 81 MG tablet Take 81 mg by mouth daily.    . carvedilol (COREG) 3.125 MG tablet Take 1 tablet (3.125 mg total) by mouth 2 (two) times daily with a meal. 60 tablet 0  . cyclobenzaprine (FLEXERIL) 10 MG tablet Take 1 tablet (10 mg total) by mouth at bedtime. 14 tablet 0  . diclofenac sodium (VOLTAREN) 1 % GEL Apply 4 g topically 4 (four) times daily. 2 Tube 3  . diphenhydrAMINE (BENADRYL) 25 MG tablet Take 1 tablet (25 mg total) by mouth every 6 (six) hours as needed. 20 tablet 0  . famotidine (PEPCID) 20 MG tablet Take 1 tablet (20 mg total) by mouth 2 (two) times  daily. 180 tablet 1  . furosemide (LASIX) 20 MG tablet Take 1 tablet (20 mg total) by mouth daily. 30 tablet 0  . hydroxypropyl methylcellulose / hypromellose (ISOPTO TEARS / GONIOVISC) 2.5 % ophthalmic solution Place 1 drop into both eyes 2 (two) times daily.    Marland Kitchen ibuprofen (ADVIL,MOTRIN) 800 MG tablet Take 1 tablet (800 mg total) by mouth every 8 (eight) hours as needed. 90 tablet 3  . ipratropium (ATROVENT) 0.06 % nasal spray Place 2 sprays into both nostrils 4 (four) times daily. For allergies 30 mL 0  . levothyroxine (SYNTHROID, LEVOTHROID) 75 MCG tablet Take 1 tablet (75 mcg total) by mouth every morning. 30 tablet 0  .  loratadine (CLARITIN) 10 MG tablet Take 1 tablet (10 mg total) by mouth daily. (This medicine may be purchased from over there counter at yr local pharmacy): For allergies 30 tablet 0  . losartan (COZAAR) 100 MG tablet Take 1 tablet (100 mg total) by mouth daily. 90 tablet 3  . meloxicam (MOBIC) 7.5 MG tablet Take 1 tablet (7.5 mg total) by mouth daily. 20 tablet 0   No current facility-administered medications on file prior to visit.     LABS/IMAGING: No results found for this or any previous visit (from the past 48 hour(s)). No results found.  LIPID PANEL:    Component Value Date/Time   CHOL 238 (H) 09/22/2012 1333   TRIG 74 04/19/2017 2332   HDL 47 09/22/2012 1333   CHOLHDL 5.1 09/22/2012 1333   VLDL 47 (H) 09/22/2012 1333   LDLCALC 144 (H) 09/22/2012 1333     WEIGHTS: Wt Readings from Last 3 Encounters:  08/03/18 258 lb 6.4 oz (117.2 kg)  05/17/18 250 lb (113.4 kg)  04/20/18 253 lb (114.8 kg)    VITALS: BP 122/74   Pulse 72   Ht 5\' 9"  (1.753 m)   Wt 258 lb 6.4 oz (117.2 kg)   LMP 07/21/2014 Comment: current  BMI 38.16 kg/m   EXAM: General appearance: alert, no distress and moderately obese Neck: no carotid bruit, no JVD and thyroid not enlarged, symmetric, no tenderness/mass/nodules Lungs: clear to auscultation bilaterally Heart: regular rate  and rhythm, S1, S2 normal, no murmur, click, rub or gallop Abdomen: soft, non-tender; bowel sounds normal; no masses,  no organomegaly Extremities: extremities normal, atraumatic, no cyanosis or edema Pulses: 2+ and symmetric Skin: Skin color, texture, turgor normal. No rashes or lesions Neurologic: Grossly normal Psych: Pleasant  EKG: Deferred  ASSESSMENT: 1. Acute systolic congestive heart failure-LVEF 35-40% (2018) -improved to 55 to 60% (2019) 2. History influenza A pneumonia 3. History of polysubstance abuse - abstinent 4. Hypertension 5. OSA on CPAP  PLAN: 1.   Jamie Hicks has had significant improvement in blood pressure.  Her EF is now normalized.  She is now 15 months off alcohol.  Is not recently been compliant on her CPAP.  Apparently, her medical supply company reclaimed her CPAP device and she was noncompliant with it.  I will reach out to her to see what needs to be done about either getting new or different equipment.  I encouraged using the equipment as often as possible.  Follow-up with me annually or sooner as necessary.  Pixie Casino, MD, [facility], [place] Director of the [facility]  [facility] Attending Cardiologist  Direct Dial: [phone]  Fax: [phone]  Website:  [email or web address] 08/03/2018, 1:31 PM

## 2018-08-25 ENCOUNTER — Encounter: Payer: Self-pay | Admitting: Gastroenterology

## 2018-09-12 ENCOUNTER — Other Ambulatory Visit (INDEPENDENT_AMBULATORY_CARE_PROVIDER_SITE_OTHER): Payer: Self-pay | Admitting: Orthopaedic Surgery

## 2018-09-12 NOTE — Telephone Encounter (Signed)
Ok to refill, hasn't been seen since 9/19

## 2018-11-03 ENCOUNTER — Other Ambulatory Visit: Payer: Self-pay

## 2018-11-03 ENCOUNTER — Ambulatory Visit: Payer: 59 | Admitting: *Deleted

## 2018-11-03 VITALS — Ht 68.0 in | Wt 260.0 lb

## 2018-11-03 DIAGNOSIS — Z1211 Encounter for screening for malignant neoplasm of colon: Secondary | ICD-10-CM

## 2018-11-03 MED ORDER — NA SULFATE-K SULFATE-MG SULF 17.5-3.13-1.6 GM/177ML PO SOLN: 1.0000 | Freq: Once | ORAL | 0 refills | Status: AC

## 2018-11-03 NOTE — Progress Notes (Signed)
No egg or soy allergy known to patient  No issues with past sedation with any surgeries  or procedures, no intubation problems  No diet pills per patient No home 02 use per patient  No blood thinners per patient  Pt denies issues with constipation  No A fib or A flutter  EMMI video sent to pt's e mail   Pt mailed instruction packet to included paper to complete and mail back to [facility] with addressed and stamped envelope, Emmi video, copy of consent form to read and not return, and instructions PV completed over the phone. Pt encouraged to call with questions or issues

## 2018-11-13 ENCOUNTER — Other Ambulatory Visit: Payer: Self-pay | Admitting: Internal Medicine

## 2018-11-15 ENCOUNTER — Telehealth: Payer: Self-pay | Admitting: *Deleted

## 2018-11-15 NOTE — Telephone Encounter (Signed)

## 2018-11-17 ENCOUNTER — Encounter: Payer: Self-pay | Admitting: Gastroenterology

## 2018-11-17 ENCOUNTER — Other Ambulatory Visit: Payer: Self-pay

## 2018-11-17 ENCOUNTER — Ambulatory Visit (AMBULATORY_SURGERY_CENTER): Payer: 59 | Admitting: Gastroenterology

## 2018-11-17 VITALS — BP 137/75 | HR 55 | Temp 98.4°F | Resp 16 | Ht 69.0 in | Wt 258.0 lb

## 2018-11-17 DIAGNOSIS — Z1211 Encounter for screening for malignant neoplasm of colon: Secondary | ICD-10-CM | POA: Diagnosis not present

## 2018-11-17 DIAGNOSIS — D125 Benign neoplasm of sigmoid colon: Secondary | ICD-10-CM

## 2018-11-17 MED ORDER — SODIUM CHLORIDE 0.9 % IV SOLN: 500.0000 mL | Freq: Once | INTRAVENOUS | Status: DC

## 2018-11-17 NOTE — Progress Notes (Signed)
[place], [place], LPN- Vitals

## 2018-11-17 NOTE — Progress Notes (Signed)
Pt's states no medical or surgical changes since previsit or office visit. 

## 2018-11-17 NOTE — Op Note (Signed)
[facility] Patient Name: Jamie Hicks Procedure Date: 11/17/2018 11:34 AM MRN: [id number] Endoscopist: Mauri Pole , MD Age: 62 Referring MD:  Date of Birth: 25-Jun-1955 Gender: Female Account #: [id number] Procedure:                Colonoscopy Indications:              Screening for colorectal malignant neoplasm Medicines:                Monitored Anesthesia Care Procedure:                Pre-Anesthesia Assessment:                           - Prior to the procedure, a History and Physical                            was performed, and patient medications and                            allergies were reviewed. The patient's tolerance of                            previous anesthesia was also reviewed. The risks                            and benefits of the procedure and the sedation                            options and risks were discussed with the patient.                            All questions were answered, and informed consent                            was obtained. Prior Anticoagulants: The patient has                            taken no previous anticoagulant or antiplatelet                            agents. ASA Grade Assessment: II - A patient with                            mild systemic disease. After reviewing the risks                            and benefits, the patient was deemed in                            satisfactory condition to undergo the procedure.                           After obtaining informed consent, the colonoscope  was passed under direct vision. Throughout the                            procedure, the patient's blood pressure, pulse, and                            oxygen saturations were monitored continuously. The                            Colonoscope was introduced through the anus and                            advanced to the the cecum, identified by                            appendiceal  orifice and ileocecal valve. The                            colonoscopy was performed without difficulty. The                            patient tolerated the procedure well. The quality                            of the bowel preparation was adequate. The                            ileocecal valve, appendiceal orifice, and rectum                            were photographed. Scope In: 11:45:25 AM Scope Out: 12:02:53 PM Scope Withdrawal Time: 0 hours 7 minutes 11 seconds  Total Procedure Duration: 0 hours 17 minutes 28 seconds  Findings:                 The perianal and digital rectal examinations were                            normal.                           A 12 mm polyp was found in the sigmoid colon. The                            polyp was pedunculated. The polyp was removed with                            a hot snare. Resection and retrieval were complete.                           Scattered small and large-mouthed diverticula were                            found in the sigmoid colon, descending colon,  transverse colon and ascending colon. There was                            evidence of an impacted diverticulum.                           Non-bleeding external and internal hemorrhoids were                            found during retroflexion. The hemorrhoids were                            medium-sized. Complications:            No immediate complications. Estimated Blood Loss:     Estimated blood loss was minimal. Impression:               - One 12 mm polyp in the sigmoid colon, removed                            with a hot snare. Resected and retrieved.                           - Moderate diverticulosis in the sigmoid colon, in                            the descending colon, in the transverse colon and                            in the ascending colon. There was evidence of an                            impacted diverticulum.                            - Non-bleeding external and internal hemorrhoids. Recommendation:           - Patient has a contact number available for                            emergencies. The signs and symptoms of potential                            delayed complications were discussed with the                            patient. Return to normal activities tomorrow.                            Written discharge instructions were provided to the                            patient.                           - Resume previous diet.                           -  Continue present medications.                           - Await pathology results.                           - Repeat colonoscopy in 3 years for surveillance                            based on pathology results. Mauri Pole, MD 11/17/2018 12:07:03 PM This report has been signed electronically.

## 2018-11-17 NOTE — Progress Notes (Signed)
A/ox3 pleased with MAC, report to 

## 2018-11-17 NOTE — Progress Notes (Signed)
Called to room to assist during endoscopic procedure.  Patient ID and intended procedure confirmed with present staff. Received instructions for my participation in the procedure from the performing physician.  

## 2018-11-17 NOTE — Patient Instructions (Signed)
YOU HAD AN ENDOSCOPIC PROCEDURE TODAY AT [facility]:   Refer to the procedure report that was given to you for any specific questions about what was found during the examination.  If the procedure report does not answer your questions, please call your gastroenterologist to clarify.  If you requested that your care partner not be given the details of your procedure findings, then the procedure report has been included in a sealed envelope for you to review at your convenience later.  YOU SHOULD EXPECT: Some feelings of bloating in the abdomen. Passage of more gas than usual.  Walking can help get rid of the air that was put into your GI tract during the procedure and reduce the bloating. If you had a lower endoscopy (such as a colonoscopy or flexible sigmoidoscopy) you may notice spotting of blood in your stool or on the toilet paper. If you underwent a bowel prep for your procedure, you may not have a normal bowel movement for a few days.  Please Note:  You might notice some irritation and congestion in your nose or some drainage.  This is from the oxygen used during your procedure.  There is no need for concern and it should clear up in a day or so.  SYMPTOMS TO REPORT IMMEDIATELY:   Following lower endoscopy (colonoscopy or flexible sigmoidoscopy):  Excessive amounts of blood in the stool  Significant tenderness or worsening of abdominal pains  Swelling of the abdomen that is new, acute  Fever of 100F or higher  For urgent or emergent issues, a gastroenterologist can be reached at any hour by calling [phone].   DIET:  We do recommend a small meal at first, but then you may proceed to your regular diet.  Drink plenty of fluids but you should avoid alcoholic beverages for 24 hours.  ACTIVITY:  You should plan to take it easy for the rest of today and you should NOT DRIVE or use heavy machinery until tomorrow (because of the sedation medicines used during the test).     FOLLOW UP: Our staff will call the number listed on your records 48-72 hours following your procedure to check on you and address any questions or concerns that you may have regarding the information given to you following your procedure. If we do not reach you, we will leave a message.  We will attempt to reach you two times.  During this call, we will ask if you have developed any symptoms of COVID 19. If you develop any symptoms (ie: fever, flu-like symptoms, shortness of breath, cough etc.) before then, please call [phone].  If you test positive for Covid 19 in the 2 weeks post procedure, please call and report this information to [place].    If any biopsies were taken you will be contacted by phone or by letter within the next 1-3 weeks.  Please call us at [phone] if you have not heard about the biopsies in 3 weeks.   Await for biopsy results Polyps (handout given) Diverticulosis (handout given) Hemorrhoids (handout given)  SIGNATURES/CONFIDENTIALITY: You and/or your care partner have signed paperwork which will be entered into your electronic medical record.  These signatures attest to the fact that that the information above on your After Visit Summary has been reviewed and is understood.  Full responsibility of the confidentiality of this discharge information lies with you and/or your care-partner.

## 2018-11-21 ENCOUNTER — Telehealth: Payer: Self-pay

## 2018-11-21 ENCOUNTER — Telehealth: Payer: Self-pay | Admitting: *Deleted

## 2018-11-21 NOTE — Telephone Encounter (Signed)
  Follow up Call-  Call back number 11/17/2018  Post procedure Call Back phone  # [phone]  Permission to leave phone message Yes  Some recent data might be hidden     Patient questions:  Phone kept ringing and ringing.

## 2018-11-21 NOTE — Telephone Encounter (Signed)
Left message on 2nd follow up call. 

## 2018-11-24 ENCOUNTER — Encounter: Payer: Self-pay | Admitting: Gastroenterology

## 2018-12-29 ENCOUNTER — Other Ambulatory Visit: Payer: Self-pay | Admitting: Internal Medicine

## 2019-03-07 ENCOUNTER — Ambulatory Visit: Payer: 59 | Admitting: Orthopaedic Surgery

## 2019-03-08 ENCOUNTER — Other Ambulatory Visit: Payer: Self-pay | Admitting: Internal Medicine

## 2019-03-12 ENCOUNTER — Other Ambulatory Visit (INDEPENDENT_AMBULATORY_CARE_PROVIDER_SITE_OTHER): Payer: Self-pay | Admitting: Orthopaedic Surgery

## 2019-03-14 ENCOUNTER — Other Ambulatory Visit: Payer: Self-pay

## 2019-03-14 ENCOUNTER — Ambulatory Visit (INDEPENDENT_AMBULATORY_CARE_PROVIDER_SITE_OTHER): Payer: 59 | Admitting: Podiatry

## 2019-03-14 DIAGNOSIS — B351 Tinea unguium: Secondary | ICD-10-CM

## 2019-03-14 DIAGNOSIS — M79674 Pain in right toe(s): Secondary | ICD-10-CM

## 2019-03-14 DIAGNOSIS — M79675 Pain in left toe(s): Secondary | ICD-10-CM | POA: Diagnosis not present

## 2019-03-14 NOTE — Progress Notes (Signed)
   SUBJECTIVE Patient presents to office today complaining of elongated, thickened nails that cause pain while ambulating in shoes. She is unable to trim Her own nails. Patient is here for further evaluation and treatment.  Past Medical History:  Diagnosis Date  . Allergy   . Anemia   . ARDS (adult respiratory distress syndrome) ([place])   . Arthritis    OA  . Back pain   . Blind right eye   . GERD (gastroesophageal reflux disease)   . Hypertension   . Thyroid disease     OBJECTIVE General Patient is awake, alert, and oriented x 3 and in no acute distress. Derm Skin is dry and supple bilateral. Negative open lesions or macerations. Remaining integument unremarkable. Nails are tender, long, thickened and dystrophic with subungual debris, consistent with onychomycosis, 1-5 bilateral. No signs of infection noted. Vasc  DP and PT pedal pulses palpable bilaterally. Temperature gradient within normal limits.  Neuro Epicritic and protective threshold sensation grossly intact bilaterally.  Musculoskeletal Exam No symptomatic pedal deformities noted bilateral. Muscular strength within normal limits.  ASSESSMENT 1. Onychodystrophic nails 1-5 bilateral with hyperkeratosis of nails.  2. Onychomycosis of nail due to dermatophyte bilateral 3. Pain in foot bilateral  PLAN OF CARE 1. Patient evaluated today.  2. Instructed to maintain good pedal hygiene and foot care.  3. Mechanical debridement of nails 1-5 bilaterally performed using a nail nipper. Filed with dremel without incident.  4. Return to clinic in 3 mos.    Edrick Kins, DPM [facility]  Dr. Edrick Kins, [address]                                        [place], [place] [address]                Office [phone]  Fax [phone]

## 2019-05-17 ENCOUNTER — Other Ambulatory Visit: Payer: Self-pay | Admitting: Internal Medicine

## 2019-06-04 ENCOUNTER — Encounter: Payer: Self-pay | Admitting: Physician Assistant

## 2019-06-04 ENCOUNTER — Ambulatory Visit (INDEPENDENT_AMBULATORY_CARE_PROVIDER_SITE_OTHER): Payer: 59 | Admitting: Physician Assistant

## 2019-06-04 ENCOUNTER — Other Ambulatory Visit: Payer: Self-pay

## 2019-06-04 VITALS — Ht 69.0 in | Wt 270.2 lb

## 2019-06-04 DIAGNOSIS — M1711 Unilateral primary osteoarthritis, right knee: Secondary | ICD-10-CM

## 2019-06-04 DIAGNOSIS — M1712 Unilateral primary osteoarthritis, left knee: Secondary | ICD-10-CM | POA: Diagnosis not present

## 2019-06-04 MED ORDER — LIDOCAINE HCL 1 % IJ SOLN
0.5000 mL | INTRAMUSCULAR | Status: AC | PRN
  Administered 2019-06-04: 15:00:00 .5 mL

## 2019-06-04 MED ORDER — METHYLPREDNISOLONE ACETATE 40 MG/ML IJ SUSP
40.0000 mg | INTRAMUSCULAR | Status: AC | PRN
  Administered 2019-06-04: 15:00:00 40 mg via INTRA_ARTICULAR

## 2019-06-04 MED ORDER — METHYLPREDNISOLONE ACETATE 40 MG/ML IJ SUSP
40.0000 mg | INTRAMUSCULAR | Status: AC | PRN
  Administered 2019-06-04: 40 mg via INTRA_ARTICULAR

## 2019-06-04 NOTE — Progress Notes (Signed)
Office Visit Note   Patient: Jamie Hicks           Date of Birth: 05-04-1956           MRN: [id number] Visit Date: 06/04/2019              Requested by: Jamie Ebbs, MD [address] [place],  [place] [address] PCP: Jamie Ebbs, MD   Assessment & Plan: Visit Diagnoses:  1. Unilateral primary osteoarthritis, left knee   2. Unilateral primary osteoarthritis, right knee     Plan: Recommend she continue to use Voltaren gel on her knees work on Forensic scientist.  Follow-up with [place] on as-needed basis for cortisone injections.  Questions encouraged and answered  Follow-Up Instructions: No follow-ups on file.   Orders:  Orders Placed This Encounter  Procedures  . Large Joint Inj   No orders of the defined types were placed in this encounter.     Procedures: Large Joint Inj: bilateral knee on 06/04/2019 2:44 PM Indications: pain Details: 22 G 1.5 in needle, anterolateral approach  Arthrogram: No  Medications (Right): 0.5 mL lidocaine 1 %; 40 mg methylPREDNISolone acetate 40 MG/ML Aspirate (Right): 5 mL yellow and blood-tinged Medications (Left): 0.5 mL lidocaine 1 %; 40 mg methylPREDNISolone acetate 40 MG/ML Outcome: tolerated well, no immediate complications Procedure, treatment alternatives, risks and benefits explained, specific risks discussed. Consent was given by the patient. Immediately prior to procedure a time out was called to verify the correct patient, procedure, equipment, support staff and site/side marked as required. Patient was prepped and draped in the usual sterile fashion.       Clinical Data: No additional findings.   Subjective: Chief Complaint  Patient presents with  . Left Knee - Pain  . Right Knee - Pain    HPI Jamie Hicks has known moderate to severe arthritis of both knees.  She comes in today due to increasing pain in both knees.  She feels she has fluid on both knees.  Left knee currently bothering her more than the  right.  She last saw Dr. Ninfa Hicks in September 2019 and underwent injections in both knees at that time.  Did well until around Thanksgiving.  She has had no injury to either knee.  History of left knee arthroscopy years ago. Review of Systems Please see HPI otherwise negative  Objective: Vital Signs: Ht 5\' 9"  (1.753 m)   Wt 270 lb 3.2 oz (122.6 kg)   LMP 07/21/2014 Comment: current  BMI 39.90 kg/m   Physical Exam Constitutional:      Appearance: She is not ill-appearing or diaphoretic.  Neurological:     Mental Status: She is alert and oriented to person, place, and time.  Psychiatric:        Behavior: Behavior normal.     Ortho Exam Bilateral knees: No abnormal warmth erythema.  Slight effusion right knee no effusion left knee.  Good range of motion of both knees.  Tenderness along medial joint line of both knees.  Varus deformities both knees. Specialty Comments:  No specialty comments available.  Imaging: No results found.   PMFS History: Patient Active Problem List   Diagnosis Date Noted  . Snoring 03/09/2018  . Daytime sleepiness 03/09/2018  . Effusion, left knee 10/13/2017  . Chronic pain of left knee 09/26/2017  . Unilateral primary osteoarthritis, left knee 09/26/2017  . Unilateral primary osteoarthritis, right knee 09/26/2017  . Chronic pain of right knee 09/26/2017  . Polysubstance abuse ([place]) 05/31/2017  .  Acute systolic (congestive) heart failure ([place]) 04/25/2017  . Acute respiratory failure with hypoxia ([place])   . ARDS (adult respiratory distress syndrome) ([place])   . Acute respiratory distress   . Community acquired pneumonia of left upper lobe of lung   . Hypoxemia   . Influenza A 04/18/2017  . Left upper lobe pneumonia 04/18/2017  . Obesity, Class III, BMI 40-49.9 (morbid obesity) ([place]) 04/18/2017  . Thyroid activity decreased   . Cocaine dependence with cocaine-induced mood disorder ([place]) 07/22/2014  . Major depressive disorder, recurrent, severe  without psychotic features ([place])   . Substance induced mood disorder ([place]) 07/19/2014  . Suicidal ideation   . Homicidal ideation   . 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 High myopia 10/10/2013  . Hx of retinal detachment 10/10/2013  . Nuclear sclerosis of both eyes 10/10/2013  . Osteoarthritis 09/24/2012  . Prediabetes 09/24/2012  . Dyslipidemia 09/24/2012  . Vitamin D insufficiency 09/24/2012  . Hypothyroidism 09/22/2012  . Bilateral chronic knee pain 09/22/2012  . Smoker 09/22/2012  . Hypertension 09/22/2012   Past Medical History:  Diagnosis Date  . Allergy   . Anemia   . ARDS (adult respiratory distress syndrome) ([place])   . Arthritis    OA  . Back pain   . Blind right eye   . GERD (gastroesophageal reflux disease)   . Hypertension   . Thyroid disease     Family History  Problem Relation Age of Onset  . Hypertension Mother        Living, currently 43 years old  . Cancer Father   . Colon cancer Neg Hx   . Colon polyps Neg Hx   . Esophageal cancer Neg Hx   . Rectal cancer Neg Hx   . Stomach cancer Neg Hx     Past Surgical History:  Procedure Laterality Date  . HERNIA REPAIR    . knee arthorscopy     Social History   Occupational History  . Not on file  Tobacco Use  . Smoking status: Former Smoker    Packs/day: 1.00    Years: 40.00    Pack years: 40.00    Types: Cigarettes    Quit date: 05/01/2017    Years since quitting: 2.0  . Smokeless tobacco: Never Used  Substance and Sexual Activity  . Alcohol use: Not Currently    Comment: none in the last   . Drug use: Not Currently    Types: "Crack" cocaine  . Sexual activity: Not on file

## 2019-06-06 ENCOUNTER — Other Ambulatory Visit: Payer: Self-pay

## 2019-06-06 ENCOUNTER — Ambulatory Visit (INDEPENDENT_AMBULATORY_CARE_PROVIDER_SITE_OTHER): Payer: 59 | Admitting: Podiatry

## 2019-06-06 ENCOUNTER — Encounter: Payer: Self-pay | Admitting: Podiatry

## 2019-06-06 DIAGNOSIS — M79674 Pain in right toe(s): Secondary | ICD-10-CM

## 2019-06-06 DIAGNOSIS — M79675 Pain in left toe(s): Secondary | ICD-10-CM | POA: Diagnosis not present

## 2019-06-06 DIAGNOSIS — B351 Tinea unguium: Secondary | ICD-10-CM | POA: Diagnosis not present

## 2019-06-06 NOTE — Progress Notes (Signed)
Complaint:  Visit Type: Patient returns to my office for continued preventative foot care services. Complaint: Patient states" my nails have grown long and thick and become painful to walk and wear shoes" Patient has been diagnosed with DM with no foot complications. The patient presents for preventative foot care services.  Podiatric Exam: Vascular: dorsalis pedis and posterior tibial pulses are palpable bilateral. Capillary return is immediate. Temperature gradient is WNL. Skin turgor WNL  Sensorium: Normal Semmes Weinstein monofilament test. Normal tactile sensation bilaterally. Nail Exam: Pt has thick disfigured discolored nails with subungual debris noted bilateral entire nail hallux through fifth toenails Ulcer Exam: There is no evidence of ulcer or pre-ulcerative changes or infection. Orthopedic Exam: Muscle tone and strength are WNL. No limitations in general ROM. No crepitus or effusions noted. Foot type and digits show no abnormalities. Bony prominences are unremarkable. Skin: No Porokeratosis. No infection or ulcers  Diagnosis:  Onychomycosis, , Pain in right toe, pain in left toes  Treatment & Plan Procedures and Treatment: Consent by patient was obtained for treatment procedures.   Debridement of mycotic and hypertrophic toenails, 1 through 5 bilateral and clearing of subungual debris. No ulceration, no infection noted.  Return Visit-Office Procedure: Patient instructed to return to the office for a follow up visit 3 months for continued evaluation and treatment.    Alechia Lezama DPM 

## 2019-07-23 ENCOUNTER — Ambulatory Visit: Payer: 59 | Admitting: Internal Medicine

## 2019-08-21 ENCOUNTER — Other Ambulatory Visit: Payer: Self-pay

## 2019-08-21 ENCOUNTER — Ambulatory Visit (INDEPENDENT_AMBULATORY_CARE_PROVIDER_SITE_OTHER): Payer: 59 | Admitting: Internal Medicine

## 2019-08-21 ENCOUNTER — Encounter: Payer: Self-pay | Admitting: Internal Medicine

## 2019-08-21 VITALS — BP 142/88 | HR 75 | Temp 96.7°F | Wt 274.0 lb

## 2019-08-21 DIAGNOSIS — I10 Essential (primary) hypertension: Secondary | ICD-10-CM | POA: Diagnosis not present

## 2019-08-21 DIAGNOSIS — Z9989 Dependence on other enabling machines and devices: Secondary | ICD-10-CM

## 2019-08-21 DIAGNOSIS — G4733 Obstructive sleep apnea (adult) (pediatric): Secondary | ICD-10-CM

## 2019-08-21 DIAGNOSIS — R600 Localized edema: Secondary | ICD-10-CM | POA: Diagnosis not present

## 2019-08-21 NOTE — Progress Notes (Signed)
OFFICE NOTE  Chief Complaint:  Congested  Primary Care Physician: Nolene Ebbs, MD  HPI:  Jamie Hicks is a 64 y.o. female with a past medial history significant for etoh and cocaine abuse, HTN and recent admission for Influenza A pneumonia - this required intubation and mechanical ventilation and was complicated by the development of acute systolic congestive heart failure with LVEF 35-40%.  She was placed on appropriate heart failure medications and discharged.  She returns today for follow-up.  She reports improvement in her breathing since discharge.  She was recently in the hospital for chest discomfort but was diagnosed with reflux and her symptoms improved on famotidine.  She currently denies any chest pain.  She says she is completely stopped alcohol and tobacco abuse as well as illicit drugs.  She says she wants to lead a healthier cleaner lifestyle.  Her only complaints are for some nasal congestion related to her apartment.  She says it is an old building and she is looking at moving to a new apartment starting in January.  She reports compliance with her medications although has not started aspirin.  Blood pressure was elevated today 146/88 which did not change after 10 minutes with a repeat blood pressure check.   12/2017  Jamie Hicks returns today for follow-up.  She reports in the interim she has given up alcohol and smoking.  She is committed to living a healthier life.  She is followed up with Dr. Jeanie Cooks.  She has been compliant with her medications.  I recently increased her losartan up to 50 mg and her blood pressure today is 134/80.  Weight is been stable.  She denies any worsening swelling, shortness of breath or chest pain.  03/09/2018  Jamie Hicks is seen today in follow-up.  She just turned 62 and overall she feels great.  She has been abstinent for alcohol for almost a year and will get her 1 year sobriety chip from [facility].  She says that recently she has had  some fatigue and wonders because she snores loudly whether she could have sleep apnea.  She is certainly overweight with a thick neck and has witnessed snoring and nonrestorative sleep.  Fortunately, I can report that her EF has improved now up to 55 to 60%.  She will need to remain on heart failure medicines long-term.  Blood pressure was little elevated today and should be monitored at home.  She does not currently have a cough and I recommended Omron.  We will need to monitor home blood pressures and see if her meds should be adjusted.  04/20/2018  Jamie Hicks is seen today in follow-up.  She is very excited today as she is now 1 year sober.  She recently had a sleep study which showed severe sleep apnea with an AHI of 64.3/h.  He was started on CPAP therapy and she received her machine today.  She hopes to start it tonight.  I suspect she will have improvement in her symptoms.  Her blood pressure remains elevated.  This may improve over time with CPAP therapy as well as weight loss, however for now she will need more medication.  08/03/2018  Jamie Hicks is seen today in follow-up.  Is now been 3 months since I made some adjustments in her medications.  Her blood pressure now is much better controlled on higher dose losartan.  Today she is 122/74 recently during a home visit her blood pressure was 128/86.  She denies any chest  pain or worsening shortness of breath.  She is continue to work on weight loss.  She is not recently been compliant with her CPAP and was diagnosed with severe apnea with an AHI of 63 recently.  And not sure what the issue is with her equipment.  She is apparently being serviced by [employer].  We will reach out to our sleep coordinator to see if she needs another study or perhaps to contact [facility] to see if her equipment can be adjusted or replaced.  08/21/2019  Jamie Hicks returns for follow-up.  Blood pressure is much better controlled although was a little elevated today.   She says she was rushed to get here early this morning.  She is not being compliant with CPAP due to discomfort with the mask.  She did have severe apnea with an AHI of 63 and therefore would benefit from it.  She also struggling with congestion and seasonal allergies and also would have some relief from humidified air.  I advised her to reach out with our sleep center to see if her equipment could be replaced again.  No evidence for newly decompensated heart failure.  Her EF had normalized as of 2019.  PMHx:  Past Medical History:  Diagnosis Date  . Allergy   . Anemia   . ARDS (adult respiratory distress syndrome) ([place])   . Arthritis    OA  . Back pain   . Blind right eye   . GERD (gastroesophageal reflux disease)   . Hypertension   . Thyroid disease     Past Surgical History:  Procedure Laterality Date  . HERNIA REPAIR    . knee arthorscopy      FAMHx:  Family History  Problem Relation Age of Onset  . Hypertension Mother        Living, currently 7 years old  . Cancer Father   . Colon cancer Neg Hx   . Colon polyps Neg Hx   . Esophageal cancer Neg Hx   . Rectal cancer Neg Hx   . Stomach cancer Neg Hx     SOCHx:   reports that she quit smoking about 2 years ago. Her smoking use included cigarettes. She has a 40.00 pack-year smoking history. She has never used smokeless tobacco. She reports previous alcohol use. She reports previous drug use. Drug: "Crack" cocaine.  ALLERGIES:  Allergies  Allergen Reactions  . Amoxicillin     Has patient had a PCN reaction causing immediate rash, facial/tongue/throat swelling, SOB or lightheadedness with hypotension: yes Has patient had a PCN reaction causing severe rash involving mucus membranes or skin necrosis: no Has patient had a PCN reaction that required hospitalization: yes  Has patient had a PCN reaction occurring within the last 10 years: yes, 08/29/16 If all of the above answers are "NO", then may proceed with Cephalosporin  use.   Marland Kitchen Lisinopril Cough  . Latex Rash    ROS: Pertinent items noted in HPI and remainder of comprehensive ROS otherwise negative.  HOME MEDS: Current Outpatient Medications on File Prior to Visit  Medication Sig Dispense Refill  . acetaminophen (TYLENOL) 325 MG tablet Take 325 mg by mouth daily as needed for moderate pain.    Marland Kitchen ascorbic acid (VITAMIN C) 100 MG tablet Take 100 mg by mouth daily.    Marland Kitchen aspirin EC 81 MG tablet Take 81 mg by mouth daily.    Marland Kitchen atorvastatin (LIPITOR) 20 MG tablet TAKE 1 TABLET BY MOUTH EVERYDAY AT BEDTIME    .  carvedilol (COREG) 3.125 MG tablet Take 1 tablet (3.125 mg total) by mouth 2 (two) times daily with a meal. 60 tablet 0  . cetirizine (ZYRTEC) 10 MG tablet TAKE 1 TABLET BY MOUTH EVERY DAY AS NEEDED FOR ALLERGY    . cholecalciferol (VITAMIN D3) 25 MCG (1000 UT) tablet Take 1,000 Units by mouth daily.    . [employer] ANTIFUNGAL 1 % cream APPLY TO AFFECTED AREA TWICE A DAY    . cyclobenzaprine (FLEXERIL) 10 MG tablet Take 1 tablet (10 mg total) by mouth at bedtime. 14 tablet 0  . diclofenac (VOLTAREN) 75 MG EC tablet TAKE 1 TABLET BY MOUTH TWICE A DAY WITH FOOD AS NEEDED    . diclofenac sodium (VOLTAREN) 1 % GEL Apply 4 g topically 4 (four) times daily. 2 Tube 3  . diphenhydrAMINE (BENADRYL) 25 MG tablet Take 1 tablet (25 mg total) by mouth every 6 (six) hours as needed. 20 tablet 0  . famotidine (PEPCID) 20 MG tablet TAKE 1 TABLET BY MOUTH TWICE A DAY 229 tablet 1  . folic acid (FOLVITE) 1 MG tablet Take 1 mg by mouth daily.    . furosemide (LASIX) 20 MG tablet Take 1 tablet (20 mg total) by mouth daily. 30 tablet 0  . gabapentin (NEURONTIN) 100 MG capsule TAKE 1 CAPSULE BY MOUTH THREE TIMES A DAY    . hydroxypropyl methylcellulose / hypromellose (ISOPTO TEARS / GONIOVISC) 2.5 % ophthalmic solution Place 1 drop into both eyes 2 (two) times daily.    Marland Kitchen ibuprofen (ADVIL) 800 MG tablet TAKE 1 TABLET BY MOUTH EVERY 8 HOURS AS NEEDED 90 tablet 3  . ipratropium  (ATROVENT) 0.06 % nasal spray Place 2 sprays into both nostrils 4 (four) times daily. For allergies 30 mL 0  . levothyroxine (SYNTHROID) 100 MCG tablet TAKE 1 TABLET BY MOUTH EVERY DAY IN THE MORNING    . levothyroxine (SYNTHROID) 125 MCG tablet TAKE 1 TABLET BY MOUTH EVERY DAY IN THE MORNING    . loratadine (CLARITIN) 10 MG tablet Take 1 tablet (10 mg total) by mouth daily. (This medicine may be purchased from over there counter at yr local pharmacy): For allergies 30 tablet 0  . losartan (COZAAR) 100 MG tablet TAKE 1 TABLET BY MOUTH EVERY DAY 90 tablet 1  . SUPREP BOWEL PREP KIT 17.5-3.13-1.6 GM/177ML SOLN See admin instructions.    . Vitamin D, Ergocalciferol, (DRISDOL) 1.25 MG (50000 UT) CAPS capsule Take 50,000 Units by mouth once a week.     Current Facility-Administered Medications on File Prior to Visit  Medication Dose Route Frequency Provider Last Rate Last Admin  . 0.9 %  sodium chloride infusion  500 mL Intravenous Once Nandigam, Kavitha V, MD        LABS/IMAGING: No results found for this or any previous visit (from the past 48 hour(s)). No results found.  LIPID PANEL:    Component Value Date/Time   CHOL 238 (H) 09/22/2012 1333   TRIG 74 04/19/2017 2332   HDL 47 09/22/2012 1333   CHOLHDL 5.1 09/22/2012 1333   VLDL 47 (H) 09/22/2012 1333   LDLCALC 144 (H) 09/22/2012 1333     WEIGHTS: Wt Readings from Last 3 Encounters:  08/21/19 274 lb (124.3 kg)  06/04/19 270 lb 3.2 oz (122.6 kg)  11/17/18 258 lb (117 kg)    VITALS: BP (!) 142/88   Pulse 75   Temp (!) 96.7 F (35.9 C)   Wt 274 lb (124.3 kg)   LMP 07/21/2014 Comment:  current  SpO2 96%   BMI 40.46 kg/m   EXAM: General appearance: alert, no distress and moderately obese Neck: no carotid bruit, no JVD and thyroid not enlarged, symmetric, no tenderness/mass/nodules Lungs: clear to auscultation bilaterally Heart: regular rate and rhythm, S1, S2 normal, no murmur, click, rub or gallop Abdomen: soft,  non-tender; bowel sounds normal; no masses,  no organomegaly Extremities: extremities normal, atraumatic, no cyanosis or edema Pulses: 2+ and symmetric Skin: Skin color, texture, turgor normal. No rashes or lesions Neurologic: Grossly normal Psych: Pleasant  EKG: Normal sinus rhythm, QTC 510 ms-personally reviewed  ASSESSMENT: 1. Acute systolic congestive heart failure-LVEF 35-40% (2018) -improved to 55 to 60% (2019) 2. History influenza A pneumonia 3. History of polysubstance abuse - abstinent 4. Hypertension 5. Dyslipidemia 6. OSA on CPAP - non-compliant 7. Peripheral neuropathy with edema  PLAN: 1.   Jamie Hicks seems to be doing well without evidence of congestive heart failure.  Blood pressure is reasonably well controlled.  She is noncompliant with CPAP.  I advised her to reach out to the sleep center to see if her equipment can be further adjusted.  She does have some peripheral neuropathy with occasional edema.  I encouraged compression stockings.  She should remain on low-dose Lasix.  Plan follow-up with me annually or sooner as necessary.  Pixie Casino, MD, [facility], [place] Director of the [facility] Attending Cardiologist  Direct Dial: [phone]  Fax: [phone]  Website:  [email or web address] 08/21/2019, 8:11 AM

## 2019-08-21 NOTE — Patient Instructions (Signed)

## 2019-08-30 ENCOUNTER — Other Ambulatory Visit: Payer: Self-pay | Admitting: Internal Medicine

## 2019-09-05 ENCOUNTER — Encounter: Payer: Self-pay | Admitting: Podiatry

## 2019-09-05 ENCOUNTER — Ambulatory Visit (INDEPENDENT_AMBULATORY_CARE_PROVIDER_SITE_OTHER): Payer: 59 | Admitting: Podiatry

## 2019-09-05 ENCOUNTER — Other Ambulatory Visit: Payer: Self-pay

## 2019-09-05 VITALS — Temp 95.8°F

## 2019-09-05 DIAGNOSIS — M79675 Pain in left toe(s): Secondary | ICD-10-CM

## 2019-09-05 DIAGNOSIS — M79674 Pain in right toe(s): Secondary | ICD-10-CM | POA: Diagnosis not present

## 2019-09-05 DIAGNOSIS — B351 Tinea unguium: Secondary | ICD-10-CM | POA: Diagnosis not present

## 2019-09-05 NOTE — Progress Notes (Signed)
This patient returns to the office for evaluation and treatment of long thick painful nails .  This patient is unable to trim his own nails since the patient cannot reach the feet.  Patient says the nails are painful walking and wearing his shoes.  He returns for preventive foot care services.  General Appearance  Alert, conversant and in no acute stress.  Vascular  Dorsalis pedis and posterior tibial  pulses are palpable  bilaterally.  Capillary return is within normal limits  bilaterally. Temperature is within normal limits  bilaterally.  Neurologic  Senn-Weinstein monofilament wire test within normal limits  bilaterally. Muscle power within normal limits bilaterally.  Nails Thick disfigured discolored nails with subungual debris  from hallux to fifth toes bilaterally. No evidence of bacterial infection or drainage bilaterally.  Orthopedic  No limitations of motion  feet .  No crepitus or effusions noted.  No bony pathology or digital deformities noted.  Skin  normotropic skin with no porokeratosis noted bilaterally.  No signs of infections or ulcers noted.     Onychomycosis  Pain in toes right foot  Pain in toes left foot  Debridement  of nails  1-5  B/L with a nail nipper.  Nails were then filed using a dremel tool with no incidents.    RTC 3 months    Marielis Samara DPM  

## 2019-09-18 ENCOUNTER — Telehealth: Payer: Self-pay | Admitting: Internal Medicine

## 2019-09-18 NOTE — Telephone Encounter (Signed)
She could probably double up on her lasix (take 40 mg daily for 3 days) to help with the edema, then go back to her 20 mg daily dose. Agree with stockings.  Dr Lemmie Evens

## 2019-09-18 NOTE — Telephone Encounter (Signed)
Returned the call to the patient. She stated that she has been having bilateral ankle swelling for the past 3 days. She does not check her weight due to lack of a scale.   She denies any shortness of breath. She has been advised to elevate her legs as much as possible and to look into buying compression stockings. She stated that she has not had a chance to do that yet.   She has been watching her sodium intake and currently takes Furosemide 20 mg daily.   She has been advised to try elevating her legs and buying compression stockings. If this does not help, then she should call back.

## 2019-09-18 NOTE — Telephone Encounter (Signed)
New Message    Pt c/o swelling: STAT is pt has developed SOB within 24 hours  1) How much weight have you gained and in what time span? Pt has not been on a scale   2) If swelling, where is the swelling located? Both ankles, left ankle more then the right  For the last 3 days   3) Are you currently taking a fluid pill? Yes   4) Are you currently SOB? No   5) Do you have a log of your daily weights (if so, list)? Pt has not weighed herself   6) Have you gained 3 pounds in a day or 5 pounds in a week? Unknown    7)Have you traveled recently? No    Pt is now having congestion and coughing as well

## 2019-09-19 NOTE — Telephone Encounter (Signed)
Patient made aware and verbalized her understanding. She will call back if she does not see any results.

## 2019-09-21 ENCOUNTER — Other Ambulatory Visit: Payer: Self-pay | Admitting: Internal Medicine

## 2019-10-20 ENCOUNTER — Other Ambulatory Visit: Payer: Self-pay | Admitting: Internal Medicine

## 2019-10-29 ENCOUNTER — Encounter (HOSPITAL_COMMUNITY): Payer: Self-pay

## 2019-10-29 ENCOUNTER — Other Ambulatory Visit: Payer: Self-pay

## 2019-10-29 ENCOUNTER — Ambulatory Visit (HOSPITAL_COMMUNITY)
Admission: EM | Admit: 2019-10-29 | Discharge: 2019-10-29 | Disposition: A | Discharge status: Home / Self Care | Payer: 59 | Attending: Internal Medicine | Admitting: Internal Medicine

## 2019-10-29 DIAGNOSIS — L0292 Furuncle, unspecified: Secondary | ICD-10-CM

## 2019-10-29 DIAGNOSIS — B351 Tinea unguium: Secondary | ICD-10-CM

## 2019-10-29 MED ORDER — TERBINAFINE HCL 250 MG PO TABS: 250.0000 mg | ORAL_TABLET | Freq: Every day | ORAL | 0 refills | Status: AC

## 2019-10-29 MED ORDER — DOXYCYCLINE HYCLATE 100 MG PO CAPS: 100.0000 mg | ORAL_CAPSULE | Freq: Two times a day (BID) | ORAL | 0 refills | Status: AC

## 2019-10-29 MED ORDER — HYDROXYZINE HCL 25 MG PO TABS: 25.0000 mg | ORAL_TABLET | Freq: Four times a day (QID) | ORAL | 0 refills | Status: DC

## 2019-10-29 MED ORDER — CALAMINE EX LOTN: 1.0000 "application " | TOPICAL_LOTION | CUTANEOUS | 0 refills | Status: DC | PRN

## 2019-10-29 NOTE — ED Triage Notes (Signed)
Pt states she was at church yesterday and fire ants bit her feet bilat. Pt has red raised bumps on feet bilat and states they itch.

## 2019-10-31 NOTE — ED Provider Notes (Signed)
[facility]    CSN: [id number] Arrival date & time: 10/29/19  1804      History   Chief Complaint Chief Complaint  Patient presents with  . fire ant bites    HPI Jamie Hicks is a 64 y.o. female comes to the urgent care with multiple lesions over the lower extremities.  According to the patient she was bitten by some fire ants yesterday when she was leaving church.  She started having itching in those legs and upon scratching she has developed multiple fluid-filled lesions which are red.  No fever or chills.  No history of allergies.   HPI  Past Medical History:  Diagnosis Date  . Allergy   . Anemia   . ARDS (adult respiratory distress syndrome) ([place])   . Arthritis    OA  . Back pain   . Blind right eye   . GERD (gastroesophageal reflux disease)   . Hypertension   . Thyroid disease     Patient Active Problem List   Diagnosis Date Noted  . Snoring 03/09/2018  . Daytime sleepiness 03/09/2018  . Effusion, left knee 10/13/2017  . Chronic pain of left knee 09/26/2017  . Unilateral primary osteoarthritis, left knee 09/26/2017  . Unilateral primary osteoarthritis, right knee 09/26/2017  . Chronic pain of right knee 09/26/2017  . Polysubstance abuse ([place]) 05/31/2017  . Acute systolic (congestive) heart failure ([place]) 04/25/2017  . Acute respiratory failure with hypoxia ([place])   . ARDS (adult respiratory distress syndrome) ([place])   . Acute respiratory distress   . Community acquired pneumonia of left upper lobe of lung   . Hypoxemia   . Influenza A 04/18/2017  . Left upper lobe pneumonia 04/18/2017  . Obesity, Class III, BMI 40-49.9 (morbid obesity) ([place]) 04/18/2017  . Thyroid activity decreased   . Cocaine dependence with cocaine-induced mood disorder ([place]) 07/22/2014  . Major depressive disorder, recurrent, severe without psychotic features ([place])   . Substance induced mood disorder ([place]) 07/19/2014  . Suicidal ideation   . Homicidal ideation   .  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 High myopia 10/10/2013  . Hx of retinal detachment 10/10/2013  . Nuclear sclerosis of both eyes 10/10/2013  . Osteoarthritis 09/24/2012  . Prediabetes 09/24/2012  . Dyslipidemia 09/24/2012  . Vitamin D insufficiency 09/24/2012  . Hypothyroidism 09/22/2012  . Bilateral chronic knee pain 09/22/2012  . Smoker 09/22/2012  . Hypertension 09/22/2012    Past Surgical History:  Procedure Laterality Date  . HERNIA REPAIR    . knee arthorscopy      OB History   No obstetric history on file.      Home Medications    Prior to Admission medications   Medication Sig Start Date End Date Taking? Authorizing Provider  acetaminophen (TYLENOL) 325 MG tablet Take 325 mg by mouth daily as needed for moderate pain.    [provider]  acetaminophen (TYLENOL) 650 MG CR tablet Take 650 mg by mouth every 8 (eight) hours as needed for pain.    [provider]  ascorbic acid (VITAMIN C) 100 MG tablet Take 100 mg by mouth daily.    [provider]  aspirin EC 81 MG tablet Take 81 mg by mouth daily.    [provider]  atorvastatin (LIPITOR) 20 MG tablet TAKE 1 TABLET BY MOUTH EVERYDAY AT BEDTIME 01/19/19   [provider]  calamine lotion Apply 1 application topically as needed for itching. 10/29/19   Kayloni Rocco, Myrene Galas, MD  carvedilol (COREG) 3.125 MG tablet Take 1 tablet (3.125 mg total) by mouth 2 (two) times daily with a meal. 06/05/17 11/17/18  Yu, Amy V, PA-C  cetirizine (ZYRTEC) 10 MG tablet TAKE 1 TABLET BY MOUTH EVERY DAY AS NEEDED FOR ALLERGY 02/15/19   [provider]  cholecalciferol (VITAMIN D3) 25 MCG (1000 UT) tablet Take 1,000 Units by mouth daily.    [provider]  diclofenac (VOLTAREN) 75 MG EC tablet TAKE 1 TABLET BY MOUTH TWICE A DAY WITH FOOD AS NEEDED 02/15/19   [provider]  diclofenac sodium (VOLTAREN) 1 % GEL Apply 4 g topically 4 (four) times daily. 07/17/17   Bjorn Pippin, PA-C    diphenhydrAMINE (BENADRYL) 25 MG tablet Take 1 tablet (25 mg total) by mouth every 6 (six) hours as needed. 08/29/16   Duffy Bruce, MD  doxycycline (VIBRAMYCIN) 100 MG capsule Take 1 capsule (100 mg total) by mouth 2 (two) times daily for 5 days. 10/29/19 11/03/19  Chase Picket, MD  famotidine (PEPCID) 20 MG tablet TAKE 1 TABLET BY MOUTH TWICE A DAY 08/30/19   Hilty, Nadean Corwin, MD  folic acid (FOLVITE) 1 MG tablet Take 1 mg by mouth daily. 02/10/19   [provider]  furosemide (LASIX) 20 MG tablet Take 1 tablet (20 mg total) by mouth daily. 06/05/17 11/17/18  Tasia Catchings, Amy V, PA-C  gabapentin (NEURONTIN) 100 MG capsule TAKE 1 CAPSULE BY MOUTH THREE TIMES A DAY 02/15/19   [provider]  hydroxypropyl methylcellulose / hypromellose (ISOPTO TEARS / GONIOVISC) 2.5 % ophthalmic solution Place 1 drop into both eyes 2 (two) times daily.    [provider]  hydrOXYzine (ATARAX/VISTARIL) 25 MG tablet Take 1 tablet (25 mg total) by mouth every 6 (six) hours. 10/29/19   Chase Picket, MD  ibuprofen (ADVIL) 800 MG tablet TAKE 1 TABLET BY MOUTH EVERY 8 HOURS AS NEEDED 03/12/19   Mcarthur Rossetti, MD  ipratropium (ATROVENT) 0.06 % nasal spray Place 2 sprays into both nostrils 4 (four) times daily. For allergies 06/05/17   Ok Edwards, PA-C  levothyroxine (SYNTHROID) 125 MCG tablet TAKE 1 TABLET BY MOUTH EVERY DAY IN THE MORNING 01/18/19   [provider]  losartan (COZAAR) 100 MG tablet TAKE 1 TABLET BY MOUTH EVERY DAY 10/23/19   Hilty, Nadean Corwin, MD  SUPREP BOWEL PREP KIT 17.5-3.13-1.6 GM/177ML SOLN See admin instructions. 11/03/18   [provider]  terbinafine (LAMISIL) 250 MG tablet Take 1 tablet (250 mg total) by mouth daily. 10/29/19 12/10/19  LampteyMyrene Galas, MD  Vitamin D, Ergocalciferol, (DRISDOL) 1.25 MG (50000 UT) CAPS capsule Take 50,000 Units by mouth once a week. 02/10/19   [provider]  loratadine (CLARITIN) 10 MG tablet Take 1 tablet (10 mg  total) by mouth daily. (This medicine may be purchased from over there counter at yr local pharmacy): For allergies 06/05/17 10/29/19  Ok Edwards, PA-C    Family History Family History  Problem Relation Age of Onset  . Hypertension Mother        Living, currently 58 years old  . Cancer Father   . Colon cancer Neg Hx   . Colon polyps Neg Hx   . Esophageal cancer Neg Hx   . Rectal cancer Neg Hx   . Stomach cancer Neg Hx     Social History Social History   Tobacco Use  . Smoking status: Former Smoker    Packs/day: 1.00    Years: 40.00  Pack years: 40.00    Types: Cigarettes    Quit date: 05/01/2017    Years since quitting: 2.5  . Smokeless tobacco: Never Used  Substance Use Topics  . Alcohol use: Not Currently    Comment: none in the last   . Drug use: Not Currently    Types: "Crack" cocaine     Allergies   Amoxicillin, Lisinopril, and Latex   Review of Systems Review of Systems  Constitutional: Negative.   Gastrointestinal: Negative.   Genitourinary: Negative.   Musculoskeletal: Negative for arthralgias, joint swelling, neck pain and neck stiffness.  Skin: Positive for color change, rash and wound.     Physical Exam Triage Vital Signs ED Triage Vitals  Enc Vitals Group     BP 10/29/19 1840 (!) 155/67     Pulse Rate 10/29/19 1840 67     Resp 10/29/19 1840 18     Temp 10/29/19 1840 97.9 F (36.6 C)     Temp Source 10/29/19 1840 Oral     SpO2 10/29/19 1840 98 %     Weight 10/29/19 1843 270 lb (122.5 kg)     Height 10/29/19 1843 _0  (1.727 m)     Head Circumference --      Peak Flow --      Pain Score 10/29/19 1843 7     Pain Loc --      Pain Edu? --      Excl. in [place]? --    No data found.  Updated Vital Signs BP (!) 155/67   Pulse 67   Temp 97.9 F (36.6 C) (Oral)   Resp 18   Ht _1  (1.727 m)   Wt 122.5 kg   LMP 07/21/2014 Comment: current  SpO2 98%   BMI 41.05 kg/m   Visual Acuity Right Eye Distance:   Left Eye Distance:     Bilateral Distance:    Right Eye Near:   Left Eye Near:    Bilateral Near:     Physical Exam Vitals and nursing note reviewed.  Constitutional:      Appearance: Normal appearance. She is not ill-appearing.  Cardiovascular:     Rate and Rhythm: Normal rate and regular rhythm.  Abdominal:     General: Abdomen is flat. Bowel sounds are normal.     Tenderness: There is no abdominal tenderness. There is no guarding.  Musculoskeletal:        General: No swelling or tenderness. Normal range of motion.  Skin:    General: Skin is warm.     Capillary Refill: Capillary refill takes less than 2 seconds.     Findings: Erythema, lesion and rash present.  Neurological:     Mental Status: She is alert.      UC Treatments / Results  Labs (all labs ordered are listed, but only abnormal results are displayed) Labs Reviewed - No data to display  EKG   Radiology No results found.  Procedures Procedures (including critical care time)  Medications Ordered in UC Medications - No data to display  Initial Impression / Assessment and Plan / UC Course  I have reviewed the triage vital signs and the nursing notes.  Pertinent labs & imaging results that were available during my care of the patient were reviewed by me and considered in my medical decision making (see chart for details).     1.  Furunculosis: Doxycycline Hydroxyzine 1 tablet every 6 hours as needed for itching Calamine lotion Return precautions given  2.  Onychomycosis of the first toenails bilaterally: Lamisil 250 mg orally daily for 6 weeks. Final Clinical Impressions(s) / UC Diagnoses   Final diagnoses:  Furunculosis  Onychomycosis   Discharge Instructions   None    ED Prescriptions    Medication Sig Dispense Auth. Provider   doxycycline (VIBRAMYCIN) 100 MG capsule Take 1 capsule (100 mg total) by mouth 2 (two) times daily for 5 days. 10 capsule Yanet Balliet, Myrene Galas, MD   hydrOXYzine (ATARAX/VISTARIL) 25 MG  tablet Take 1 tablet (25 mg total) by mouth every 6 (six) hours. 20 tablet Buna Cuppett, Myrene Galas, MD   calamine lotion Apply 1 application topically as needed for itching. 120 mL Kobie Matkins, Myrene Galas, MD   terbinafine (LAMISIL) 250 MG tablet Take 1 tablet (250 mg total) by mouth daily. 42 tablet Fate Galanti, Myrene Galas, MD     PDMP not reviewed this encounter.   Chase Picket, MD 10/31/19 1415

## 2019-12-07 ENCOUNTER — Ambulatory Visit (INDEPENDENT_AMBULATORY_CARE_PROVIDER_SITE_OTHER): Payer: 59 | Admitting: Podiatry

## 2019-12-07 ENCOUNTER — Other Ambulatory Visit: Payer: Self-pay

## 2019-12-07 ENCOUNTER — Encounter: Payer: Self-pay | Admitting: Podiatry

## 2019-12-07 DIAGNOSIS — B351 Tinea unguium: Secondary | ICD-10-CM | POA: Diagnosis not present

## 2019-12-07 DIAGNOSIS — M79674 Pain in right toe(s): Secondary | ICD-10-CM

## 2019-12-07 DIAGNOSIS — M79675 Pain in left toe(s): Secondary | ICD-10-CM | POA: Diagnosis not present

## 2019-12-07 NOTE — Progress Notes (Signed)
This patient returns to the office for evaluation and treatment of long thick painful nails .  This patient is unable to trim his own nails since the patient cannot reach the feet.  Patient says the nails are painful walking and wearing his shoes.  He returns for preventive foot care services.  General Appearance  Alert, conversant and in no acute stress.  Vascular  Dorsalis pedis and posterior tibial  pulses are palpable  bilaterally.  Capillary return is within normal limits  bilaterally. Temperature is within normal limits  bilaterally.  Neurologic  Senn-Weinstein monofilament wire test within normal limits  bilaterally. Muscle power within normal limits bilaterally.  Nails Thick disfigured discolored nails with subungual debris  from hallux to fifth toes bilaterally. No evidence of bacterial infection or drainage bilaterally.  Orthopedic  No limitations of motion  feet .  No crepitus or effusions noted.  No bony pathology or digital deformities noted.  Skin  normotropic skin with no porokeratosis noted bilaterally.  No signs of infections or ulcers noted.     Onychomycosis  Pain in toes right foot  Pain in toes left foot  Debridement  of nails  1-5  B/L with a nail nipper.  Nails were then filed using a dremel tool with no incidents.    RTC 3 months    Annalynn Centanni DPM  

## 2019-12-13 ENCOUNTER — Telehealth: Payer: Self-pay

## 2019-12-13 NOTE — Telephone Encounter (Signed)
Patient called in wanting to know should she go to urgent care asap for her swelling knee or wait to come to  Her upcoming appt on 7/14.

## 2019-12-13 NOTE — Telephone Encounter (Signed)
Yes, if she is having a great deal of pain with it, unless she wants to see [facility]?

## 2019-12-14 ENCOUNTER — Other Ambulatory Visit: Payer: Self-pay | Admitting: Internal Medicine

## 2019-12-14 DIAGNOSIS — E2839 Other primary ovarian failure: Secondary | ICD-10-CM

## 2019-12-14 DIAGNOSIS — Z1231 Encounter for screening mammogram for malignant neoplasm of breast: Secondary | ICD-10-CM

## 2019-12-26 ENCOUNTER — Encounter: Payer: Self-pay | Admitting: Orthopaedic Surgery

## 2019-12-26 ENCOUNTER — Ambulatory Visit: Payer: Self-pay

## 2019-12-26 ENCOUNTER — Ambulatory Visit (INDEPENDENT_AMBULATORY_CARE_PROVIDER_SITE_OTHER): Payer: 59 | Admitting: Orthopaedic Surgery

## 2019-12-26 DIAGNOSIS — G8929 Other chronic pain: Secondary | ICD-10-CM | POA: Diagnosis not present

## 2019-12-26 DIAGNOSIS — M25562 Pain in left knee: Secondary | ICD-10-CM | POA: Diagnosis not present

## 2019-12-26 DIAGNOSIS — M1712 Unilateral primary osteoarthritis, left knee: Secondary | ICD-10-CM

## 2019-12-26 MED ORDER — METHYLPREDNISOLONE ACETATE 40 MG/ML IJ SUSP
40.0000 mg | INTRAMUSCULAR | Status: AC | PRN
  Administered 2019-12-26: 40 mg via INTRA_ARTICULAR

## 2019-12-26 MED ORDER — LIDOCAINE HCL 1 % IJ SOLN
3.0000 mL | INTRAMUSCULAR | Status: AC | PRN
  Administered 2019-12-26: 3 mL

## 2019-12-26 NOTE — Progress Notes (Signed)
Office Visit Note   Patient: Jamie Hicks           Date of Birth: 12/04/1955           MRN: [id number] Visit Date: 12/26/2019              Requested by: Nolene Ebbs, MD [address] [place],  [place] [address] PCP: Nolene Ebbs, MD   Assessment & Plan: Visit Diagnoses:  1. Chronic pain of left knee   2. Unilateral primary osteoarthritis, left knee     Plan: I did place a steroid injection of the left knee today to hopefully temporize her symptoms.  She is a candidate for hyaluronic acid.  She has not had a gel shot in her left knee before.  We will work on getting this ordered for her because I think this is the next step prior to considering knee replacement surgery.  She agrees with this treatment plan as well.  Follow-Up Instructions: No follow-ups on file.   Orders:  Orders Placed This Encounter  Procedures  . Large Joint Inj: L knee  . XR Knee 1-2 Views Left   No orders of the defined types were placed in this encounter.     Procedures: Large Joint Inj: L knee on 12/26/2019 4:52 PM Indications: diagnostic evaluation and pain Details: 22 G 1.5 in needle, superolateral approach  Arthrogram: No  Medications: 3 mL lidocaine 1 %; 40 mg methylPREDNISolone acetate 40 MG/ML Outcome: tolerated well, no immediate complications Procedure, treatment alternatives, risks and benefits explained, specific risks discussed. Consent was given by the patient. Immediately prior to procedure a time out was called to verify the correct patient, procedure, equipment, support staff and site/side marked as required. Patient was prepped and draped in the usual sterile fashion.       Clinical Data: No additional findings.   Subjective: Chief Complaint  Patient presents with  . Left Knee - Pain, Edema  The patient is someone of seen in the past.  She last had a steroid injection in her left knee in December of last year.  Her left knee is hurting again significantly.   There is been some swelling noted as well.  She did let me know her mother just recently passed away last week at 63 years old.  She has had an arthroscopic intervention remotely in this left knee.  She denies any acute change in her medical status.  HPI  Review of Systems She currently denies any headache, chest pain, shortness of breath, fever, chills, nausea, vomiting  Objective: Vital Signs: LMP 07/21/2014 Comment: current  Physical Exam She is alert and orient x3 and in no acute distress Ortho Exam Examination of her left knee shows a mild effusion.  It is slightly warm.  She has medial and lateral joint line tenderness and patellofemoral crepitation with good range of motion. Specialty Comments:  No specialty comments available.  Imaging: XR Knee 1-2 Views Left  Result Date: 12/26/2019 Two views of the left knee show severe end-stage arthritis with joint space narrowing and peritracheal osteophytes.    PMFS History: Patient Active Problem List   Diagnosis Date Noted  . Snoring 03/09/2018  . Daytime sleepiness 03/09/2018  . Effusion, left knee 10/13/2017  . Chronic pain of left knee 09/26/2017  . Unilateral primary osteoarthritis, left knee 09/26/2017  . Unilateral primary osteoarthritis, right knee 09/26/2017  . Chronic pain of right knee 09/26/2017  . Polysubstance abuse ([place]) 05/31/2017  . Acute systolic (congestive)  heart failure ([place]) 04/25/2017  . Acute respiratory failure with hypoxia ([place])   . ARDS (adult respiratory distress syndrome) ([place])   . Acute respiratory distress   . Community acquired pneumonia of left upper lobe of lung   . Hypoxemia   . Influenza A 04/18/2017  . Left upper lobe pneumonia 04/18/2017  . Obesity, Class III, BMI 40-49.9 (morbid obesity) ([place]) 04/18/2017  . Thyroid activity decreased   . Cocaine dependence with cocaine-induced mood disorder ([place]) 07/22/2014  . Major depressive disorder, recurrent, severe without psychotic features  ([place])   . Substance induced mood disorder ([place]) 07/19/2014  . Suicidal ideation   . Homicidal ideation   . 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 High myopia 10/10/2013  . Hx of retinal detachment 10/10/2013  . Nuclear sclerosis of both eyes 10/10/2013  . Osteoarthritis 09/24/2012  . Prediabetes 09/24/2012  . Dyslipidemia 09/24/2012  . Vitamin D insufficiency 09/24/2012  . Hypothyroidism 09/22/2012  . Bilateral chronic knee pain 09/22/2012  . Smoker 09/22/2012  . Hypertension 09/22/2012   Past Medical History:  Diagnosis Date  . Allergy   . Anemia   . ARDS (adult respiratory distress syndrome) ([place])   . Arthritis    OA  . Back pain   . Blind right eye   . GERD (gastroesophageal reflux disease)   . Hypertension   . Thyroid disease     Family History  Problem Relation Age of Onset  . Hypertension Mother        Living, currently 63 years old  . Cancer Father   . Colon cancer Neg Hx   . Colon polyps Neg Hx   . Esophageal cancer Neg Hx   . Rectal cancer Neg Hx   . Stomach cancer Neg Hx     Past Surgical History:  Procedure Laterality Date  . HERNIA REPAIR    . knee arthorscopy     Social History   Occupational History  . Not on file  Tobacco Use  . Smoking status: Former Smoker    Packs/day: 1.00    Years: 40.00    Pack years: 40.00    Types: Cigarettes    Quit date: 05/01/2017    Years since quitting: 2.6  . Smokeless tobacco: Never Used  Vaping Use  . Vaping Use: Never used  Substance and Sexual Activity  . Alcohol use: Not Currently    Comment: none in the last   . Drug use: Not Currently    Types: "Crack" cocaine  . Sexual activity: Not on file

## 2019-12-27 ENCOUNTER — Telehealth: Payer: Self-pay

## 2019-12-27 NOTE — Telephone Encounter (Signed)
Left knee gel injection ?

## 2019-12-31 NOTE — Telephone Encounter (Signed)
Noted  

## 2020-01-02 ENCOUNTER — Telehealth: Payer: Self-pay

## 2020-01-02 NOTE — Telephone Encounter (Signed)
Submitted VOB, Durolane, left knee.  

## 2020-01-25 ENCOUNTER — Telehealth: Payer: Self-pay

## 2020-01-25 NOTE — Telephone Encounter (Signed)
Approved, Durolane, left knee. [place] has been met. Patient will be responsible for 20% OOP. No Co-pay No PA required

## 2020-02-07 ENCOUNTER — Encounter: Payer: Self-pay | Admitting: Orthopaedic Surgery

## 2020-02-07 ENCOUNTER — Ambulatory Visit (INDEPENDENT_AMBULATORY_CARE_PROVIDER_SITE_OTHER): Payer: 59 | Admitting: Orthopaedic Surgery

## 2020-02-07 DIAGNOSIS — M1712 Unilateral primary osteoarthritis, left knee: Secondary | ICD-10-CM

## 2020-02-07 DIAGNOSIS — M25562 Pain in left knee: Secondary | ICD-10-CM

## 2020-02-07 DIAGNOSIS — G8929 Other chronic pain: Secondary | ICD-10-CM

## 2020-02-07 MED ORDER — SODIUM HYALURONATE 60 MG/3ML IX PRSY
60.0000 mg | PREFILLED_SYRINGE | INTRA_ARTICULAR | Status: AC | PRN
  Administered 2020-02-07: 60 mg via INTRA_ARTICULAR

## 2020-02-07 NOTE — Progress Notes (Signed)
° °  Procedure Note  Patient: Jamie Hicks             Date of Birth: 1956/06/09           MRN: [id number]             Visit Date: 02/07/2020  Procedures: Visit Diagnoses:  1. Chronic pain of left knee   2. Unilateral primary osteoarthritis, left knee     Large Joint Inj on 02/07/2020 3:18 PM Indications: diagnostic evaluation and pain Details: 22 G 1.5 in needle, superolateral approach  Arthrogram: No  Medications: 60 mg Sodium Hyaluronate 60 MG/3ML Outcome: tolerated well, no immediate complications Procedure, treatment alternatives, risks and benefits explained, specific risks discussed. Consent was given by the patient. Immediately prior to procedure a time out was called to verify the correct patient, procedure, equipment, support staff and site/side marked as required. Patient was prepped and draped in the usual sterile fashion.     The patient is here today for scheduled hyaluronic acid injection in her left knee to treat the pain from osteoarthritis having failed other conservative treatments including steroid injections.  Today's injection is Durolane.  She understands the rationale behind injections like this as well as the risk and benefits involved.  She has had no acute change in her medical status.  Examination of her left knee shows no effusion but a painful arc of motion and global tenderness.  I did place dural in her left knee without difficulty.  All questions and concerns were answered and addressed.  Follow-up with me as needed.

## 2020-02-08 ENCOUNTER — Telehealth: Payer: Self-pay

## 2020-02-08 NOTE — Telephone Encounter (Signed)
Can you take a message with her complaints and symptoms, so we can just send message to [facility], if they are too bad she probably just needs to make an appointment

## 2020-02-08 NOTE — Telephone Encounter (Signed)
Patient called in wanting to speak about pain . Says she is still in a lot of pain

## 2020-02-29 ENCOUNTER — Other Ambulatory Visit: Payer: Self-pay

## 2020-02-29 DIAGNOSIS — I8390 Asymptomatic varicose veins of unspecified lower extremity: Secondary | ICD-10-CM

## 2020-03-07 ENCOUNTER — Ambulatory Visit
Admission: RE | Admit: 2020-03-07 | Discharge: 2020-03-07 | Disposition: A | Discharge status: Home / Self Care | Payer: 59 | Source: Ambulatory Visit | Attending: Internal Medicine | Admitting: Internal Medicine

## 2020-03-07 ENCOUNTER — Other Ambulatory Visit: Payer: Self-pay

## 2020-03-07 ENCOUNTER — Other Ambulatory Visit: Payer: 59

## 2020-03-07 DIAGNOSIS — Z1231 Encounter for screening mammogram for malignant neoplasm of breast: Secondary | ICD-10-CM

## 2020-03-10 ENCOUNTER — Encounter (HOSPITAL_COMMUNITY): Payer: 59

## 2020-03-12 ENCOUNTER — Other Ambulatory Visit: Payer: Self-pay

## 2020-03-12 ENCOUNTER — Encounter: Payer: 59 | Admitting: Vascular Surgery

## 2020-03-12 DIAGNOSIS — I8390 Asymptomatic varicose veins of unspecified lower extremity: Secondary | ICD-10-CM

## 2020-03-14 ENCOUNTER — Ambulatory Visit: Payer: 59 | Admitting: Podiatry

## 2020-03-19 ENCOUNTER — Other Ambulatory Visit (INDEPENDENT_AMBULATORY_CARE_PROVIDER_SITE_OTHER): Payer: Self-pay | Admitting: Orthopaedic Surgery

## 2020-04-02 ENCOUNTER — Ambulatory Visit (HOSPITAL_COMMUNITY): Payer: 59

## 2020-06-10 ENCOUNTER — Encounter: Payer: Self-pay | Admitting: Podiatry

## 2020-06-10 ENCOUNTER — Ambulatory Visit (INDEPENDENT_AMBULATORY_CARE_PROVIDER_SITE_OTHER): Payer: 59 | Admitting: Podiatry

## 2020-06-10 ENCOUNTER — Other Ambulatory Visit: Payer: Self-pay

## 2020-06-10 DIAGNOSIS — B351 Tinea unguium: Secondary | ICD-10-CM | POA: Diagnosis not present

## 2020-06-10 DIAGNOSIS — M79675 Pain in left toe(s): Secondary | ICD-10-CM | POA: Diagnosis not present

## 2020-06-10 DIAGNOSIS — M79674 Pain in right toe(s): Secondary | ICD-10-CM

## 2020-06-10 NOTE — Progress Notes (Signed)
This patient returns to the office for evaluation and treatment of long thick painful nails .  This patient is unable to trim his own nails since the patient cannot reach the feet.  Patient says the nails are painful walking and wearing his shoes.  He returns for preventive foot care services.  General Appearance  Alert, conversant and in no acute stress.  Vascular  Dorsalis pedis and posterior tibial  pulses are palpable  bilaterally.  Capillary return is within normal limits  bilaterally. Temperature is within normal limits  bilaterally.  Neurologic  Senn-Weinstein monofilament wire test within normal limits  bilaterally. Muscle power within normal limits bilaterally.  Nails Thick disfigured discolored nails with subungual debris  from hallux to fifth toes bilaterally. No evidence of bacterial infection or drainage bilaterally.  Orthopedic  No limitations of motion  feet .  No crepitus or effusions noted.  No bony pathology or digital deformities noted.  Skin  normotropic skin with no porokeratosis noted bilaterally.  No signs of infections or ulcers noted.     Onychomycosis  Pain in toes right foot  Pain in toes left foot  Debridement  of nails  1-5  B/L with a nail nipper.  Nails were then filed using a dremel tool with no incidents.    RTC 3 months    Lashara Urey DPM  

## 2020-08-11 ENCOUNTER — Encounter: Payer: Self-pay | Admitting: Orthopaedic Surgery

## 2020-08-11 ENCOUNTER — Other Ambulatory Visit: Payer: Self-pay

## 2020-08-11 ENCOUNTER — Ambulatory Visit (INDEPENDENT_AMBULATORY_CARE_PROVIDER_SITE_OTHER): Payer: 59 | Admitting: Orthopaedic Surgery

## 2020-08-11 DIAGNOSIS — M25562 Pain in left knee: Secondary | ICD-10-CM

## 2020-08-11 DIAGNOSIS — G8929 Other chronic pain: Secondary | ICD-10-CM

## 2020-08-11 DIAGNOSIS — M1712 Unilateral primary osteoarthritis, left knee: Secondary | ICD-10-CM

## 2020-08-11 MED ORDER — METHYLPREDNISOLONE ACETATE 40 MG/ML IJ SUSP
40.0000 mg | INTRAMUSCULAR | Status: AC | PRN
  Administered 2020-08-11: 40 mg via INTRA_ARTICULAR

## 2020-08-11 MED ORDER — LIDOCAINE HCL 1 % IJ SOLN
3.0000 mL | INTRAMUSCULAR | Status: AC | PRN
  Administered 2020-08-11: 3 mL

## 2020-08-11 NOTE — Progress Notes (Signed)
Office Visit Note   Patient: Jamie Hicks           Date of Birth: 10/07/1955           MRN: [id number] Visit Date: 08/11/2020              Requested by: Nolene Ebbs, MD [address] [place],  [place] [address] PCP: Nolene Ebbs, MD   Assessment & Plan: Visit Diagnoses:  1. Chronic pain of left knee   2. Unilateral primary osteoarthritis, left knee     Plan: Previous x-rays of her knee have always shown osteoarthritis of the left knee but is getting moderate to severe.  I did aspirate at least 30 to 40 cc of clear fluid from the knee today and place a steroid injection in her left knee which she tolerated well.  We will see her back in 4 months to see how she is doing overall.  At that visit I would like her weight and a BMI calculated.  Follow-Up Instructions: Return in about 4 months (around 12/09/2020).   Orders:  No orders of the defined types were placed in this encounter.  No orders of the defined types were placed in this encounter.     Procedures: Large Joint Inj: L knee on 08/11/2020 8:51 AM Indications: diagnostic evaluation and pain Details: 22 G 1.5 in needle, superolateral approach  Arthrogram: No  Medications: 3 mL lidocaine 1 %; 40 mg methylPREDNISolone acetate 40 MG/ML Outcome: tolerated well, no immediate complications Procedure, treatment alternatives, risks and benefits explained, specific risks discussed. Consent was given by the patient. Immediately prior to procedure a time out was called to verify the correct patient, procedure, equipment, support staff and site/side marked as required. Patient was prepped and draped in the usual sterile fashion.       Clinical Data: No additional findings.   Subjective: Chief Complaint  Patient presents with  . Left Knee - Pain  The patient is well-known to me.  She has chronic pain osteoarthritis of the left knee.  We last provided a hyaluronic acid injection in her knee in August of last year  its been 6 months.  She is been having a flareup of knee pain and swelling.  She is someone who has had a BMI of over 40 and does walk slow.  She is only 65 years old.  She denies any other acute changes in her medical status. HPI  Review of Systems She currently denies any headache, chest pain, shortness of breath, fever, chills, nausea, vomiting  Objective: Vital Signs: LMP 07/21/2014 Comment: current  Physical Exam She is alert and oriented x3 and in no acute distress Ortho Exam Examination of her left knee does show mild effusion.  She has global tenderness with range of motion.  The knee is ligamentously stable.  There is no redness.  There is slight valgus malalignment. Specialty Comments:  No specialty comments available.  Imaging: No results found.   PMFS History: Patient Active Problem List   Diagnosis Date Noted  . Snoring 03/09/2018  . Daytime sleepiness 03/09/2018  . Effusion, left knee 10/13/2017  . Chronic pain of left knee 09/26/2017  . Unilateral primary osteoarthritis, left knee 09/26/2017  . Unilateral primary osteoarthritis, right knee 09/26/2017  . Chronic pain of right knee 09/26/2017  . Polysubstance abuse ([place]) 05/31/2017  . Acute systolic (congestive) heart failure ([place]) 04/25/2017  . Acute respiratory failure with hypoxia ([place])   . ARDS (adult respiratory distress syndrome) ([place])   .  Acute respiratory distress   . Community acquired pneumonia of left upper lobe of lung   . Hypoxemia   . Influenza A 04/18/2017  . Left upper lobe pneumonia 04/18/2017  . Obesity, Class III, BMI 40-49.9 (morbid obesity) ([place]) 04/18/2017  . Thyroid activity decreased   . Cocaine dependence with cocaine-induced mood disorder ([place]) 07/22/2014  . Major depressive disorder, recurrent, severe without psychotic features ([place])   . Substance induced mood disorder ([place]) 07/19/2014  . Suicidal ideation   . Homicidal ideation   . 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 High myopia  10/10/2013  . Hx of retinal detachment 10/10/2013  . Nuclear sclerosis of both eyes 10/10/2013  . Osteoarthritis 09/24/2012  . Prediabetes 09/24/2012  . Dyslipidemia 09/24/2012  . Vitamin D insufficiency 09/24/2012  . Hypothyroidism 09/22/2012  . Bilateral chronic knee pain 09/22/2012  . Smoker 09/22/2012  . Hypertension 09/22/2012   Past Medical History:  Diagnosis Date  . Allergy   . Anemia   . ARDS (adult respiratory distress syndrome) ([place])   . Arthritis    OA  . Back pain   . Blind right eye   . GERD (gastroesophageal reflux disease)   . Hypertension   . Thyroid disease     Family History  Problem Relation Age of Onset  . Hypertension Mother        Living, currently 72 years old  . Cancer Father   . Colon cancer Neg Hx   . Colon polyps Neg Hx   . Esophageal cancer Neg Hx   . Rectal cancer Neg Hx   . Stomach cancer Neg Hx     Past Surgical History:  Procedure Laterality Date  . HERNIA REPAIR    . knee arthorscopy     Social History   Occupational History  . Not on file  Tobacco Use  . Smoking status: Former Smoker    Packs/day: 1.00    Years: 40.00    Pack years: 40.00    Types: Cigarettes    Quit date: 05/01/2017    Years since quitting: 3.2  . Smokeless tobacco: Never Used  Vaping Use  . Vaping Use: Never used  Substance and Sexual Activity  . Alcohol use: Not Currently    Comment: none in the last   . Drug use: Not Currently    Types: "Crack" cocaine  . Sexual activity: Not on file

## 2020-08-27 ENCOUNTER — Other Ambulatory Visit: Payer: Self-pay | Admitting: Internal Medicine

## 2020-09-09 ENCOUNTER — Ambulatory Visit (INDEPENDENT_AMBULATORY_CARE_PROVIDER_SITE_OTHER): Payer: 59 | Admitting: Podiatry

## 2020-09-09 ENCOUNTER — Other Ambulatory Visit: Payer: Self-pay

## 2020-09-09 ENCOUNTER — Ambulatory Visit (INDEPENDENT_AMBULATORY_CARE_PROVIDER_SITE_OTHER): Payer: 59 | Admitting: Internal Medicine

## 2020-09-09 ENCOUNTER — Encounter: Payer: Self-pay | Admitting: Podiatry

## 2020-09-09 ENCOUNTER — Encounter: Payer: Self-pay | Admitting: Internal Medicine

## 2020-09-09 VITALS — BP 112/77 | HR 56 | Ht 68.0 in | Wt 258.0 lb

## 2020-09-09 DIAGNOSIS — M79674 Pain in right toe(s): Secondary | ICD-10-CM

## 2020-09-09 DIAGNOSIS — I5022 Chronic systolic (congestive) heart failure: Secondary | ICD-10-CM

## 2020-09-09 DIAGNOSIS — M79675 Pain in left toe(s): Secondary | ICD-10-CM

## 2020-09-09 DIAGNOSIS — E785 Hyperlipidemia, unspecified: Secondary | ICD-10-CM | POA: Diagnosis not present

## 2020-09-09 DIAGNOSIS — B351 Tinea unguium: Secondary | ICD-10-CM | POA: Diagnosis not present

## 2020-09-09 DIAGNOSIS — Z79899 Other long term (current) drug therapy: Secondary | ICD-10-CM

## 2020-09-09 DIAGNOSIS — E039 Hypothyroidism, unspecified: Secondary | ICD-10-CM

## 2020-09-09 LAB — TSH: TSH: 1.89 u[IU]/mL (ref 0.450–4.500)

## 2020-09-09 MED ORDER — FUROSEMIDE 20 MG PO TABS: 20.0000 mg | ORAL_TABLET | Freq: Every day | ORAL | 3 refills | Status: DC

## 2020-09-09 MED ORDER — CARVEDILOL 3.125 MG PO TABS: 3.1250 mg | ORAL_TABLET | Freq: Two times a day (BID) | ORAL | 3 refills | Status: DC

## 2020-09-09 MED ORDER — ATORVASTATIN CALCIUM 20 MG PO TABS: ORAL_TABLET | ORAL | 3 refills | Status: AC

## 2020-09-09 NOTE — Patient Instructions (Addendum)
Medication Instructions:  Your physician recommends that you continue on your current medications as directed. Please refer to the Current Medication list given to you today.  *If you need a refill on your cardiac medications before your next appointment, please call your pharmacy*   Lab Work: CBC, CMET, Lipid, A1c, TSH  If you have labs (blood work) drawn today and your tests are completely normal, you will receive your results only by: Marland Kitchen MyChart Message (if you have MyChart) OR . A paper copy in the mail If you have any lab test that is abnormal or we need to change your treatment, we will call you to review the results.   Testing/Procedures: NONE   Follow-Up: At [facility], you and your health needs are our priority.  As part of our continuing mission to provide you with exceptional heart care, we have created designated Provider Care Teams.  These Care Teams include your primary Cardiologist (physician) and Advanced Practice Providers (APPs -  Physician Assistants and Nurse Practitioners) who all work together to provide you with the care you need, when you need it.  We recommend signing up for the patient portal called "MyChart".  Sign up information is provided on this After Visit Summary.  MyChart is used to connect with patients for Virtual Visits (Telemedicine).  Patients are able to view lab/test results, encounter notes, upcoming appointments, etc.  Non-urgent messages can be sent to your provider as well.   To learn more about what you can do with MyChart, go to [email or web address].    Your next appointment:   12 month(s)  The format for your next appointment:   In Person  Provider:   You may see Pixie Casino, MD or one of the following Advanced Practice Providers on your designated Care Team:    Almyra Deforest, PA-C  Fabian Sharp, PA-C or   Roby Lofts, [place]    Other Instructions

## 2020-09-09 NOTE — Progress Notes (Signed)
OFFICE NOTE  Chief Complaint:  Weak, diarrhea  Primary Care Physician: Nolene Ebbs, MD  HPI:  Jamie Hicks is a 65 y.o. female with a past medial history significant for etoh and cocaine abuse, HTN and recent admission for Influenza A pneumonia - this required intubation and mechanical ventilation and was complicated by the development of acute systolic congestive heart failure with LVEF 35-40%.  She was placed on appropriate heart failure medications and discharged.  She returns today for follow-up.  She reports improvement in her breathing since discharge.  She was recently in the hospital for chest discomfort but was diagnosed with reflux and her symptoms improved on famotidine.  She currently denies any chest pain.  She says she is completely stopped alcohol and tobacco abuse as well as illicit drugs.  She says she wants to lead a healthier cleaner lifestyle.  Her only complaints are for some nasal congestion related to her apartment.  She says it is an old building and she is looking at moving to a new apartment starting in January.  She reports compliance with her medications although has not started aspirin.  Blood pressure was elevated today 146/88 which did not change after 10 minutes with a repeat blood pressure check.   12/2017  Jamie Hicks returns today for follow-up.  She reports in the interim she has given up alcohol and smoking.  She is committed to living a healthier life.  She is followed up with Dr. Jeanie Cooks.  She has been compliant with her medications.  I recently increased her losartan up to 50 mg and her blood pressure today is 134/80.  Weight is been stable.  She denies any worsening swelling, shortness of breath or chest pain.  03/09/2018  Jamie Hicks is seen today in follow-up.  She just turned 62 and overall she feels great.  She has been abstinent for alcohol for almost a year and will get her 1 year sobriety chip from AA.  She says that recently she has  had some fatigue and wonders because she snores loudly whether she could have sleep apnea.  She is certainly overweight with a thick neck and has witnessed snoring and nonrestorative sleep.  Fortunately, I can report that her EF has improved now up to 55 to 60%.  She will need to remain on heart failure medicines long-term.  Blood pressure was little elevated today and should be monitored at home.  She does not currently have a cough and I recommended Omron.  We will need to monitor home blood pressures and see if her meds should be adjusted.  04/20/2018  Jamie Hicks is seen today in follow-up.  She is very excited today as she is now 1 year sober.  She recently had a sleep study which showed severe sleep apnea with an AHI of 64.3/h.  He was started on CPAP therapy and she received her machine today.  She hopes to start it tonight.  I suspect she will have improvement in her symptoms.  Her blood pressure remains elevated.  This may improve over time with CPAP therapy as well as weight loss, however for now she will need more medication.  08/03/2018  Jamie Hicks is seen today in follow-up.  Is now been 3 months since I made some adjustments in her medications.  Her blood pressure now is much better controlled on higher dose losartan.  Today she is 122/74 recently during a home visit her blood pressure was 128/86.  She denies any  chest pain or worsening shortness of breath.  She is continue to work on weight loss.  She is not recently been compliant with her CPAP and was diagnosed with severe apnea with an AHI of 63 recently.  And not sure what the issue is with her equipment.  She is apparently being serviced by [employer].  We will reach out to our sleep coordinator to see if she needs another study or perhaps to contact [facility] to see if her equipment can be adjusted or replaced.  08/21/2019  Jamie Hicks returns for follow-up.  Blood pressure is much better controlled although was a little elevated  today.  She says she was rushed to get here early this morning.  She is not being compliant with CPAP due to discomfort with the mask.  She did have severe apnea with an AHI of 63 and therefore would benefit from it.  She also struggling with congestion and seasonal allergies and also would have some relief from humidified air.  I advised her to reach out with our sleep center to see if her equipment could be replaced again.  No evidence for newly decompensated heart failure.  Her EF had normalized as of 2019.  09/09/2020  Jamie Hicks is seen today follow-up.  In the lobby today she said she was not feeling well.  She said she has had some diarrhea and felt a little weak but is feeling better.  Vitals are stable today.  She reports noncompliance with her CPAP.  She does take her medicines.  She did have severe sleep apnea with a very high AHI of 63.  She had normalization of her EF in 2019.  She denies any congestive heart failure symptoms.  She is overdue for labs and is fasting today.  PMHx:  Past Medical History:  Diagnosis Date  . Allergy   . Anemia   . ARDS (adult respiratory distress syndrome) ([place])   . Arthritis    OA  . Back pain   . Blind right eye   . GERD (gastroesophageal reflux disease)   . Hypertension   . Thyroid disease     Past Surgical History:  Procedure Laterality Date  . HERNIA REPAIR    . knee arthorscopy      FAMHx:  Family History  Problem Relation Age of Onset  . Hypertension Mother        Living, currently 18 years old  . Cancer Father   . Colon cancer Neg Hx   . Colon polyps Neg Hx   . Esophageal cancer Neg Hx   . Rectal cancer Neg Hx   . Stomach cancer Neg Hx     SOCHx:   reports that she quit smoking about 3 years ago. Her smoking use included cigarettes. She has a 40.00 pack-year smoking history. She has never used smokeless tobacco. She reports previous alcohol use. She reports previous drug use. Drug: "Crack" cocaine.  ALLERGIES:   Allergies  Allergen Reactions  . Amoxicillin     Has patient had a PCN reaction causing immediate rash, facial/tongue/throat swelling, SOB or lightheadedness with hypotension: yes Has patient had a PCN reaction causing severe rash involving mucus membranes or skin necrosis: no Has patient had a PCN reaction that required hospitalization: yes  Has patient had a PCN reaction occurring within the last 10 years: yes, 08/29/16 If all of the above answers are "NO", then may proceed with Cephalosporin use.   Marland Kitchen Lisinopril Cough  . Latex Rash  ROS: Pertinent items noted in HPI and remainder of comprehensive ROS otherwise negative.  HOME MEDS: Current Outpatient Medications on File Prior to Visit  Medication Sig Dispense Refill  . acetaminophen (TYLENOL) 325 MG tablet Take 325 mg by mouth daily as needed for moderate pain.    Marland Kitchen acetaminophen (TYLENOL) 650 MG CR tablet Take 650 mg by mouth every 8 (eight) hours as needed for pain.    Marland Kitchen ascorbic acid (VITAMIN C) 100 MG tablet Take 100 mg by mouth daily.    Marland Kitchen aspirin EC 81 MG tablet Take 81 mg by mouth daily.    Marland Kitchen atorvastatin (LIPITOR) 20 MG tablet TAKE 1 TABLET BY MOUTH EVERYDAY AT BEDTIME    . calamine lotion Apply 1 application topically as needed for itching. 120 mL 0  . cetirizine (ZYRTEC) 10 MG tablet TAKE 1 TABLET BY MOUTH EVERY DAY AS NEEDED FOR ALLERGY    . cetirizine (ZYRTEC) 10 MG tablet Take 10 mg by mouth daily.    . cholecalciferol (VITAMIN D3) 25 MCG (1000 UT) tablet Take 1,000 Units by mouth daily.    . diclofenac (VOLTAREN) 75 MG EC tablet TAKE 1 TABLET BY MOUTH TWICE A DAY WITH FOOD AS NEEDED    . diclofenac sodium (VOLTAREN) 1 % GEL Apply 4 g topically 4 (four) times daily. 2 Tube 3  . diphenhydrAMINE (BENADRYL) 25 MG tablet Take 1 tablet (25 mg total) by mouth every 6 (six) hours as needed. 20 tablet 0  . famotidine (PEPCID) 20 MG tablet TAKE 1 TABLET BY MOUTH TWICE A DAY 161 tablet 1  . folic acid (FOLVITE) 1 MG tablet  Take 1 mg by mouth daily.    Marland Kitchen gabapentin (NEURONTIN) 100 MG capsule TAKE 1 CAPSULE BY MOUTH THREE TIMES A DAY    . hydroxypropyl methylcellulose / hypromellose (ISOPTO TEARS / GONIOVISC) 2.5 % ophthalmic solution Place 1 drop into both eyes 2 (two) times daily.    . hydrOXYzine (ATARAX/VISTARIL) 25 MG tablet Take 1 tablet (25 mg total) by mouth every 6 (six) hours. 20 tablet 0  . ibuprofen (ADVIL) 800 MG tablet TAKE 1 TABLET BY MOUTH EVERY 8 HOURS AS NEEDED 90 tablet 3  . ipratropium (ATROVENT) 0.06 % nasal spray Place 2 sprays into both nostrils 4 (four) times daily. For allergies 30 mL 0  . levothyroxine (SYNTHROID) 125 MCG tablet TAKE 1 TABLET BY MOUTH EVERY DAY IN THE MORNING    . losartan (COZAAR) 100 MG tablet TAKE 1 TABLET BY MOUTH EVERY DAY 90 tablet 1  . SUPREP BOWEL PREP KIT 17.5-3.13-1.6 GM/177ML SOLN See admin instructions.    . Vitamin D, Ergocalciferol, (DRISDOL) 1.25 MG (50000 UT) CAPS capsule Take 50,000 Units by mouth once a week.    . carvedilol (COREG) 3.125 MG tablet Take 1 tablet (3.125 mg total) by mouth 2 (two) times daily with a meal. 60 tablet 0  . furosemide (LASIX) 20 MG tablet Take 1 tablet (20 mg total) by mouth daily. 30 tablet 0  . [DISCONTINUED] loratadine (CLARITIN) 10 MG tablet Take 1 tablet (10 mg total) by mouth daily. (This medicine may be purchased from over there counter at yr local pharmacy): For allergies 30 tablet 0   Current Facility-Administered Medications on File Prior to Visit  Medication Dose Route Frequency Provider Last Rate Last Admin  . 0.9 %  sodium chloride infusion  500 mL Intravenous Once Nandigam, Venia Minks, MD        LABS/IMAGING: No results found for this or  any previous visit (from the past 48 hour(s)). No results found.  LIPID PANEL:    Component Value Date/Time   CHOL 238 (H) 09/22/2012 1333   TRIG 74 04/19/2017 2332   HDL 47 09/22/2012 1333   CHOLHDL 5.1 09/22/2012 1333   VLDL 47 (H) 09/22/2012 1333   LDLCALC 144 (H)  09/22/2012 1333     WEIGHTS: Wt Readings from Last 3 Encounters:  09/09/20 258 lb (117 kg)  10/29/19 270 lb (122.5 kg)  08/21/19 274 lb (124.3 kg)    VITALS: BP 112/77   Pulse (!) 56   Ht 5' 8"  (1.727 m)   Wt 258 lb (117 kg)   LMP 07/21/2014 Comment: current  SpO2 99%   BMI 39.23 kg/m   EXAM: General appearance: alert, no distress and moderately obese Neck: no carotid bruit, no JVD and thyroid not enlarged, symmetric, no tenderness/mass/nodules Lungs: clear to auscultation bilaterally Heart: regular rate and rhythm, S1, S2 normal, no murmur, click, rub or gallop Abdomen: soft, non-tender; bowel sounds normal; no masses,  no organomegaly Extremities: extremities normal, atraumatic, no cyanosis or edema Pulses: 2+ and symmetric Skin: Skin color, texture, turgor normal. No rashes or lesions Neurologic: Grossly normal Psych: Pleasant  EKG: Sinus bradycardia with PVCs of 56, incomplete left bundle branch-personally reviewed  ASSESSMENT: 1. Chronic systolic congestive heart failure-LVEF 35-40% (2018) -improved to 55 to 60% (2019) 2. History influenza A pneumonia 3. History of polysubstance abuse - abstinent 4. Hypertension 5. Dyslipidemia 6. OSA on CPAP - non-compliant 7. Peripheral neuropathy with edema  PLAN: 1.   Mrs. Scherger denies any new congestive heart failure symptoms.  Her last echo showed normalization of EF in 2019.  Not clear if she is totally compliant with her medications.  She is noncompliant with sleep apnea treatments however has a severe sleep disorder.  Blood pressures well controlled today.  We will need to recheck her lipids and also metabolic profile, CBC, P5W and TSH today.  Will also refill some medications that have not been filled in a while.  I encouraged her to follow-up with her PCP.  Plan follow-up with me annually or sooner as necessary  Pixie Casino, MD, [facility] [place] Director of the Advanced Lipid  Disorders &  Card[facility]ovascular [facility] Attending Cardiologist  Direct Dial: [phone]  Fax: [phone]  Website:  [email or web address] 09/09/2020, 9:37 AM

## 2020-09-09 NOTE — Progress Notes (Signed)

## 2020-09-10 ENCOUNTER — Encounter: Payer: Self-pay | Admitting: Internal Medicine

## 2020-09-10 LAB — LIPID PANEL
Chol/HDL Ratio: 3.6 ratio (ref 0.0–4.4)
Cholesterol, Total: 132 mg/dL (ref 100–199)
HDL: 37 mg/dL — ABNORMAL LOW (ref >39)
LDL Chol Calc (NIH): 72 mg/dL (ref 0–99)
Triglycerides: 126 mg/dL (ref 0–149)
VLDL Cholesterol Cal: 23 mg/dL (ref 5–40)

## 2020-09-10 LAB — COMPREHENSIVE METABOLIC PANEL
ALT: 19 IU/L (ref 0–32)
AST: 18 IU/L (ref 0–40)
Albumin/Globulin Ratio: 1.3 (ref 1.2–2.2)
Albumin: 4.4 g/dL (ref 3.8–4.8)
Alkaline Phosphatase: 96 IU/L (ref 44–121)
BUN/Creatinine Ratio: 15 (ref 12–28)
BUN: 21 mg/dL (ref 8–27)
Bilirubin Total: 0.3 mg/dL (ref 0.0–1.2)
CO2: 23 mmol/L (ref 20–29)
Calcium: 10.3 mg/dL (ref 8.7–10.3)
Chloride: 100 mmol/L (ref 96–106)
Creatinine, Ser: 1.44 mg/dL — ABNORMAL HIGH (ref 0.57–1.00)
Globulin, Total: 3.5 g/dL (ref 1.5–4.5)
Glucose: 99 mg/dL (ref 65–99)
Potassium: 4.6 mmol/L (ref 3.5–5.2)
Sodium: 138 mmol/L (ref 134–144)
Total Protein: 7.9 g/dL (ref 6.0–8.5)
eGFR: 41 mL/min/{1.73_m2} — ABNORMAL LOW (ref >59)

## 2020-09-10 LAB — HEMOGLOBIN A1C
Est. average glucose Bld gHb Est-mCnc: 131 mg/dL
Hgb A1c MFr Bld: 6.2 % — ABNORMAL HIGH (ref 4.8–5.6)

## 2020-09-10 LAB — CBC
Hematocrit: 36.5 % (ref 34.0–46.6)
Hemoglobin: 11.8 g/dL (ref 11.1–15.9)
MCH: 25.9 pg — ABNORMAL LOW (ref 26.6–33.0)
MCHC: 32.3 g/dL (ref 31.5–35.7)
MCV: 80 fL (ref 79–97)
Platelets: 260 10*3/uL (ref 150–450)
RBC: 4.55 x10E6/uL (ref 3.77–5.28)
RDW: 14.1 % (ref 11.7–15.4)
WBC: 7 10*3/uL (ref 3.4–10.8)

## 2020-12-02 ENCOUNTER — Other Ambulatory Visit (HOSPITAL_COMMUNITY)
Admission: RE | Admit: 2020-12-02 | Discharge: 2020-12-02 | Disposition: A | Discharge status: Home / Self Care | Payer: 59 | Source: Ambulatory Visit | Attending: Obstetrics | Admitting: Obstetrics

## 2020-12-02 ENCOUNTER — Other Ambulatory Visit: Payer: Self-pay

## 2020-12-02 ENCOUNTER — Ambulatory Visit (INDEPENDENT_AMBULATORY_CARE_PROVIDER_SITE_OTHER): Payer: 59 | Admitting: Obstetrics

## 2020-12-02 ENCOUNTER — Encounter: Payer: Self-pay | Admitting: Obstetrics

## 2020-12-02 VITALS — BP 141/79 | HR 63 | Ht 68.0 in | Wt 250.8 lb

## 2020-12-02 DIAGNOSIS — Z01419 Encounter for gynecological examination (general) (routine) without abnormal findings: Secondary | ICD-10-CM

## 2020-12-02 DIAGNOSIS — E669 Obesity, unspecified: Secondary | ICD-10-CM

## 2020-12-02 DIAGNOSIS — N898 Other specified noninflammatory disorders of vagina: Secondary | ICD-10-CM

## 2020-12-02 DIAGNOSIS — Z1151 Encounter for screening for human papillomavirus (HPV): Secondary | ICD-10-CM | POA: Diagnosis not present

## 2020-12-02 NOTE — Progress Notes (Signed)
Patient is a new patient and presents for AEX.  Last pap: Unknown, has never had an abnormal pap  Last MM: Last year 2021, Normal

## 2020-12-02 NOTE — Progress Notes (Addendum)
Subjective:        Jamie Hicks is a 65 y.o. female here for a routine exam.  Current complaints: Vaginal discharge.    Personal health questionnaire:  Is patient [name], have a family history of breast and/or ovarian cancer: no Is there a family history of uterine cancer diagnosed at age < 14, gastrointestinal cancer, urinary tract cancer, family member who is a Field seismologist syndrome-associated carrier: no Is the patient overweight and hypertensive, family history of diabetes, personal history of gestational diabetes, preeclampsia or PCOS: no Is patient over 20, have PCOS,  family history of premature CHD under age 17, diabetes, smoke, have hypertension or peripheral artery disease:  no At any time, has a partner hit, kicked or otherwise hurt or frightened you?: no Over the past 2 weeks, have you felt down, depressed or hopeless?: no Over the past 2 weeks, have you felt little interest or pleasure in doing things?:no   Gynecologic History Patient's last menstrual period was 07/21/2014. Contraception: post menopausal status Last Pap: > 5 years. Results were: normal Last mammogram: 2021. Results were: normal  Obstetric History OB History  Gravida Para Term Preterm AB Living  5       1 4   SAB IAB Ectopic Multiple Live Births    1     4    # Outcome Date GA Lbr Len/2nd Weight Sex Delivery Anes PTL Lv  5 Gravida           4 Gravida           3 Gravida           2 Gravida           1 IAB             Past Medical History:  Diagnosis Date   Allergy    Anemia    ARDS (adult respiratory distress syndrome) (HCC)    Arthritis    OA   Back pain    Blind right eye    GERD (gastroesophageal reflux disease)    Hypertension    Thyroid disease     Past Surgical History:  Procedure Laterality Date   HERNIA REPAIR     knee arthorscopy       Current Outpatient Medications:    acetaminophen (TYLENOL) 650 MG CR tablet, Take 650 mg by mouth every 8 (eight) hours as  needed for pain., Disp: , Rfl:    ascorbic acid (VITAMIN C) 100 MG tablet, Take 100 mg by mouth daily., Disp: , Rfl:    aspirin EC 81 MG tablet, Take 81 mg by mouth daily., Disp: , Rfl:    atorvastatin (LIPITOR) 20 MG tablet, TAKE 1 TABLET BY MOUTH EVERYDAY AT BEDTIME, Disp: 90 tablet, Rfl: 3   cetirizine (ZYRTEC) 10 MG tablet, TAKE 1 TABLET BY MOUTH EVERY DAY AS NEEDED FOR ALLERGY, Disp: , Rfl:    cholecalciferol (VITAMIN D3) 25 MCG (1000 UT) tablet, Take 1,000 Units by mouth daily., Disp: , Rfl:    cycloSPORINE (RESTASIS) 0.05 % ophthalmic emulsion, Place 1 drop into both eyes 2 times daily., Disp: , Rfl:    famotidine (PEPCID) 20 MG tablet, TAKE 1 TABLET BY MOUTH TWICE A DAY, Disp: 180 tablet, Rfl: 1   folic acid (FOLVITE) 1 MG tablet, Take 1 mg by mouth daily., Disp: , Rfl:    gabapentin (NEURONTIN) 100 MG capsule, TAKE 1 CAPSULE BY MOUTH THREE TIMES A DAY, Disp: , Rfl:    hydroxypropyl methylcellulose /  hypromellose (ISOPTO TEARS / GONIOVISC) 2.5 % ophthalmic solution, Place 1 drop into both eyes 2 (two) times daily., Disp: , Rfl:    ibuprofen (ADVIL) 800 MG tablet, TAKE 1 TABLET BY MOUTH EVERY 8 HOURS AS NEEDED, Disp: 90 tablet, Rfl: 3   ipratropium (ATROVENT) 0.06 % nasal spray, Place 2 sprays into both nostrils 4 (four) times daily. For allergies, Disp: 30 mL, Rfl: 0   levothyroxine (SYNTHROID) 150 MCG tablet, Take 150 mcg by mouth every morning., Disp: , Rfl:    losartan (COZAAR) 100 MG tablet, TAKE 1 TABLET BY MOUTH EVERY DAY, Disp: 90 tablet, Rfl: 1   Vitamin D, Ergocalciferol, (DRISDOL) 1.25 MG (50000 UT) CAPS capsule, Take 50,000 Units by mouth once a week., Disp: , Rfl:    calamine lotion, Apply 1 application topically as needed for itching. (Patient not taking: Reported on 12/02/2020), Disp: 120 mL, Rfl: 0   diclofenac (VOLTAREN) 75 MG EC tablet, TAKE 1 TABLET BY MOUTH TWICE A DAY WITH FOOD AS NEEDED (Patient not taking: Reported on 12/02/2020), Disp: , Rfl:    diclofenac sodium  (VOLTAREN) 1 % GEL, Apply 4 g topically 4 (four) times daily. (Patient not taking: Reported on 12/02/2020), Disp: 2 Tube, Rfl: 3   furosemide (LASIX) 20 MG tablet, Take 1 tablet (20 mg total) by mouth daily., Disp: 90 tablet, Rfl: 3   hydrOXYzine (ATARAX/VISTARIL) 25 MG tablet, Take 1 tablet (25 mg total) by mouth every 6 (six) hours. (Patient not taking: Reported on 12/02/2020), Disp: 20 tablet, Rfl: 0  Current Facility-Administered Medications:    0.9 %  sodium chloride infusion, 500 mL, Intravenous, Once, Nandigam, Kavitha V, MD Allergies  Allergen Reactions   Amoxicillin Swelling    Has patient had a PCN reaction causing immediate rash, facial/tongue/throat swelling, SOB or lightheadedness with hypotension: yes Has patient had a PCN reaction causing severe rash involving mucus membranes or skin necrosis: no Has patient had a PCN reaction that required hospitalization: yes  Has patient had a PCN reaction occurring within the last 10 years: yes, 08/29/16 If all of the above answers are "NO", then may proceed with Cephalosporin use.    Lisinopril Cough   Latex Rash    Social History   Tobacco Use   Smoking status: Former    Packs/day: 1.00    Years: 40.00    Pack years: 40.00    Types: Cigarettes    Quit date: 05/01/2017    Years since quitting: 3.5   Smokeless tobacco: Never  Substance Use Topics   Alcohol use: Not Currently    Comment: none in the last     Family History  Problem Relation Age of Onset   Hypertension Mother        Living, currently 75 years old   Cancer Father    Colon cancer Neg Hx    Colon polyps Neg Hx    Esophageal cancer Neg Hx    Rectal cancer Neg Hx    Stomach cancer Neg Hx       Review of Systems  Constitutional: negative for fatigue and weight loss Respiratory: negative for cough and wheezing Cardiovascular: negative for chest pain, fatigue and palpitations Gastrointestinal: negative for abdominal pain and change in bowel  habits Musculoskeletal:negative for myalgias Neurological: negative for gait problems and tremors Behavioral/Psych: negative for abusive relationship, depression Endocrine: negative for temperature intolerance    Genitourinary: positive for vaginal discharge.  negative for abnormal menstrual periods, genital lesions, hot flashes, sexual problems  Integument/breast: negative  for breast lump, breast tenderness, nipple discharge and skin lesion(s)    Objective:       BP (!) 141/79   Pulse 63   Ht 5\' 8"  (1.727 m)   Wt 250 lb 12.8 oz (113.8 kg)   LMP 07/21/2014 Comment: current  BMI 38.13 kg/m  General:   Alert and no distress  Skin:   no rash or abnormalities  Lungs:   clear to auscultation bilaterally  Heart:   regular rate and rhythm, S1, S2 normal, no murmur, click, rub or gallop  Breasts:   normal without suspicious masses, skin or nipple changes or axillary nodes  Abdomen:  normal findings: no organomegaly, soft, non-tender and no hernia  Pelvis:  External genitalia: normal general appearance Urinary system: urethral meatus normal and bladder without fullness, nontender Vaginal: normal without tenderness, induration or masses Cervix: normal appearance Adnexa: normal bimanual exam Uterus: anteverted and non-tender, normal size   Lab Review Urine pregnancy test Labs reviewed no Radiologic studies reviewed yes  I have spent a total of 20 minutes of face-to-face time, excluding clinical staff time, reviewing notes and preparing to see patient, ordering tests and/or medications, and counseling the patient.   Assessment:    1. Encounter for routine gynecological examination with Papanicolaou smear of cervix Rx: - Cytology - PAP( [place])  2. Vaginal discharge Rx: - Cervicovaginal ancillary only( [place])  3. Obesity (BMI 35.0-39.9 without comorbidity) - weight reduction with the aid of decreased calories, exercise and behavioral modification recommended     Plan:    Education reviewed: calcium supplements, depression evaluation, low fat, low cholesterol diet, safe sex/STD prevention, self breast exams, and weight bearing exercise. Follow up in: 1 year.     Shelly Bombard, MD 12/02/2020 12:07 PM

## 2020-12-03 LAB — CERVICOVAGINAL ANCILLARY ONLY
Bacterial Vaginitis (gardnerella): NEGATIVE
Candida Glabrata: NEGATIVE
Candida Vaginitis: NEGATIVE
Comment: NEGATIVE
Comment: NEGATIVE
Comment: NEGATIVE

## 2020-12-03 LAB — CYTOLOGY - PAP
Adequacy: ABSENT
Comment: NEGATIVE
Diagnosis: NEGATIVE
High risk HPV: NEGATIVE

## 2020-12-09 ENCOUNTER — Encounter: Payer: Self-pay | Admitting: Orthopaedic Surgery

## 2020-12-09 ENCOUNTER — Ambulatory Visit (INDEPENDENT_AMBULATORY_CARE_PROVIDER_SITE_OTHER): Payer: 59 | Admitting: Orthopaedic Surgery

## 2020-12-09 ENCOUNTER — Other Ambulatory Visit: Payer: Self-pay

## 2020-12-09 VITALS — Ht 68.0 in | Wt 254.2 lb

## 2020-12-09 DIAGNOSIS — G8929 Other chronic pain: Secondary | ICD-10-CM

## 2020-12-09 DIAGNOSIS — M25562 Pain in left knee: Secondary | ICD-10-CM | POA: Diagnosis not present

## 2020-12-09 DIAGNOSIS — M1712 Unilateral primary osteoarthritis, left knee: Secondary | ICD-10-CM

## 2020-12-09 MED ORDER — METHYLPREDNISOLONE ACETATE 40 MG/ML IJ SUSP
40.0000 mg | INTRAMUSCULAR | Status: AC | PRN
  Administered 2020-12-09: 40 mg via INTRA_ARTICULAR

## 2020-12-09 MED ORDER — LIDOCAINE HCL 1 % IJ SOLN
3.0000 mL | INTRAMUSCULAR | Status: AC | PRN
  Administered 2020-12-09: 3 mL

## 2020-12-09 NOTE — Progress Notes (Signed)
Office Visit Note   Patient: Jamie Hicks           Date of Birth: 04-May-1956           MRN: [id number] Visit Date: 12/09/2020              Requested by: Nolene Ebbs, MD [address] [place],  [place] [id number] PCP: Nolene Ebbs, MD   Assessment & Plan: Visit Diagnoses:  1. Chronic pain of left knee   2. Unilateral primary osteoarthritis, left knee     Plan: We did talk about trying a steroid injection in her knee today.  She agreed to this and tolerated well.  She will continue on a weight loss journey and strengthening the knee.  If he gets to where she would like to consider knee replacement surgery will address that.  I still think she would benefit from continued injections from time to time until he gets to where these do not help.  Of note, hyaluronic acid has not helped.  Follow-Up Instructions: Return if symptoms worsen or fail to improve.   Orders:  No orders of the defined types were placed in this encounter.  No orders of the defined types were placed in this encounter.     Procedures: Large Joint Inj: L knee on 12/09/2020 9:40 AM Indications: diagnostic evaluation and pain Details: 22 G 1.5 in needle, superolateral approach  Arthrogram: No  Medications: 3 mL lidocaine 1 %; 40 mg methylPREDNISolone acetate 40 MG/ML Outcome: tolerated well, no immediate complications Procedure, treatment alternatives, risks and benefits explained, specific risks discussed. Consent was given by the patient. Immediately prior to procedure a time out was called to verify the correct patient, procedure, equipment, support staff and site/side marked as required. Patient was prepped and draped in the usual sterile fashion.      Clinical Data: No additional findings.   Subjective: Chief Complaint  Patient presents with   Left Knee - Pain, Follow-up  Patient comes in today for evaluation treatment of chronic left knee pain.  She is well-known to me and has  well-documented osteoarthritis of her left knee.  She last had a steroid injection in the left knee about 4 months ago.  Since I have seen her she has lost some weight.  She states that she has back up about 4 pounds but she is now below 40 in terms of her BMI.  She does get pain that wakes her up at night but the pain comes and goes.  She does not feel like she is ready for knee replacement as of yet.  She has had no other acute change in her medical status.  HPI  Review of Systems There is currently listed no headache, chest pain, shortness of breath, fever, chills, nausea, vomiting  Objective: Vital Signs: Ht 5\' 8"  (1.727 m)   Wt 254 lb 3.2 oz (115.3 kg)   LMP 07/21/2014 Comment: current  BMI 38.65 kg/m   Physical Exam She is alert and orient x3 and in no acute distress Ortho Exam Examination of her left knee today shows no significant effusion.  There is no redness or significant warmth.  There is global tenderness but the knee has full range of motion and is ligamentously stable. Specialty Comments:  No specialty comments available.  Imaging: No results found.   PMFS History: Patient Active Problem List   Diagnosis Date Noted   Snoring 03/09/2018   Daytime sleepiness 03/09/2018   Effusion, left knee 10/13/2017  Chronic pain of left knee 09/26/2017   Unilateral primary osteoarthritis, left knee 09/26/2017   Unilateral primary osteoarthritis, right knee 09/26/2017   Chronic pain of right knee 09/26/2017   Polysubstance abuse ([place]) 25/00/3704   Acute systolic (congestive) heart failure ([place]) 04/25/2017   Acute respiratory failure with hypoxia (HCC)    ARDS (adult respiratory distress syndrome) ([place])    Acute respiratory distress    Community acquired pneumonia of left upper lobe of lung    Hypoxemia    Influenza A 04/18/2017   Left upper lobe pneumonia 04/18/2017   Obesity, Class III, BMI 40-49.9 (morbid obesity) ([place]) 04/18/2017   Thyroid activity decreased     Cocaine dependence with cocaine-induced mood disorder ([place]) 07/22/2014   Major depressive disorder, recurrent, severe without psychotic features ([place])    Substance induced mood disorder ([place]) 07/19/2014   Suicidal ideation    Homicidal ideation    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   High myopia 10/10/2013   Hx of retinal detachment 10/10/2013   Nuclear sclerosis of both eyes 10/10/2013   Osteoarthritis 09/24/2012   Prediabetes 09/24/2012   Dyslipidemia 09/24/2012   Vitamin D insufficiency 09/24/2012   Hypothyroidism 09/22/2012   Bilateral chronic knee pain 09/22/2012   Smoker 09/22/2012   Hypertension 09/22/2012   Past Medical History:  Diagnosis Date   Allergy    Anemia    ARDS (adult respiratory distress syndrome) (HCC)    Arthritis    OA   Back pain    Blind right eye    GERD (gastroesophageal reflux disease)    Hypertension    Thyroid disease     Family History  Problem Relation Age of Onset   Hypertension Mother        Living, currently 24 years old   Cancer Father    Colon cancer Neg Hx    Colon polyps Neg Hx    Esophageal cancer Neg Hx    Rectal cancer Neg Hx    Stomach cancer Neg Hx     Past Surgical History:  Procedure Laterality Date   HERNIA REPAIR     knee arthorscopy     Social History   Occupational History   Not on file  Tobacco Use   Smoking status: Former    Packs/day: 1.00    Years: 40.00    Pack years: 40.00    Types: Cigarettes    Quit date: 05/01/2017    Years since quitting: 3.6   Smokeless tobacco: Never  Vaping Use   Vaping Use: Never used  Substance and Sexual Activity   Alcohol use: Not Currently    Comment: none in the last    Drug use: Not Currently    Types: "Crack" cocaine   Sexual activity: Not Currently

## 2020-12-10 ENCOUNTER — Ambulatory Visit: Payer: 59 | Admitting: Podiatry

## 2021-01-13 ENCOUNTER — Ambulatory Visit: Payer: 59 | Admitting: Podiatry

## 2021-02-11 ENCOUNTER — Ambulatory Visit: Payer: 59 | Admitting: Podiatry

## 2021-02-25 ENCOUNTER — Encounter: Payer: Self-pay | Admitting: Podiatry

## 2021-02-25 ENCOUNTER — Other Ambulatory Visit: Payer: Self-pay

## 2021-02-25 ENCOUNTER — Ambulatory Visit (INDEPENDENT_AMBULATORY_CARE_PROVIDER_SITE_OTHER): Payer: 59 | Admitting: Podiatry

## 2021-02-25 DIAGNOSIS — M79674 Pain in right toe(s): Secondary | ICD-10-CM | POA: Diagnosis not present

## 2021-02-25 DIAGNOSIS — M79675 Pain in left toe(s): Secondary | ICD-10-CM

## 2021-02-25 DIAGNOSIS — B351 Tinea unguium: Secondary | ICD-10-CM | POA: Diagnosis not present

## 2021-02-25 NOTE — Progress Notes (Signed)

## 2021-05-29 ENCOUNTER — Ambulatory Visit: Payer: 59 | Admitting: Podiatry

## 2021-06-05 ENCOUNTER — Ambulatory Visit: Payer: 59 | Admitting: Podiatry

## 2021-06-18 IMAGING — MG DIGITAL SCREENING BILAT W/ TOMO W/ CAD
8 series · 8 of 24 positions shown · non-contrast
Comparison: Previous exam(s).

CLINICAL DATA: Screening.

EXAM:
DIGITAL SCREENING BILATERAL MAMMOGRAM WITH TOMO AND CAD

[L CC synth-2D]
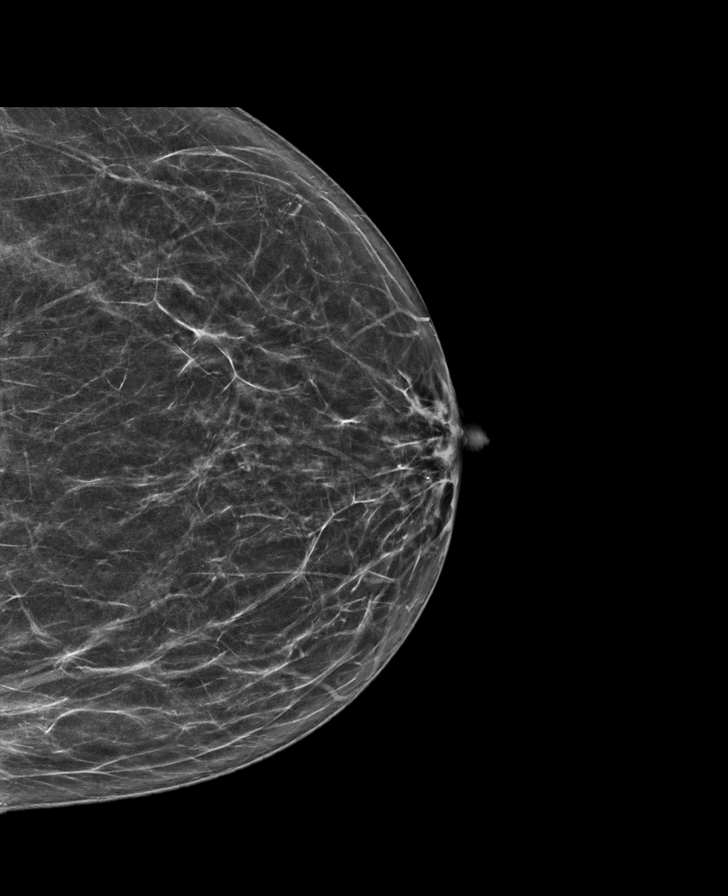

[R CC synth-2D]
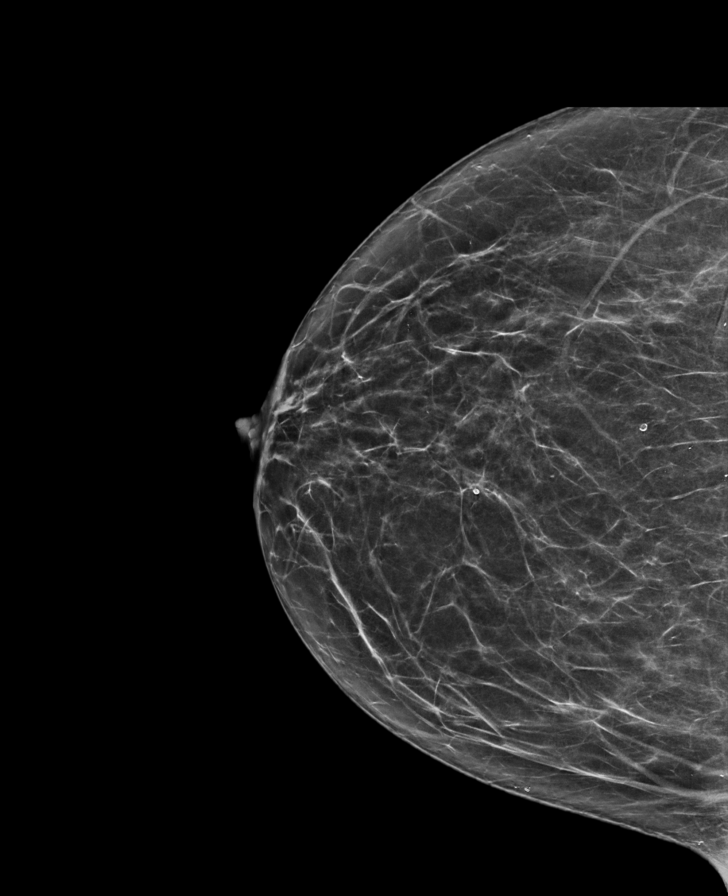

[R MLO synth-2D]
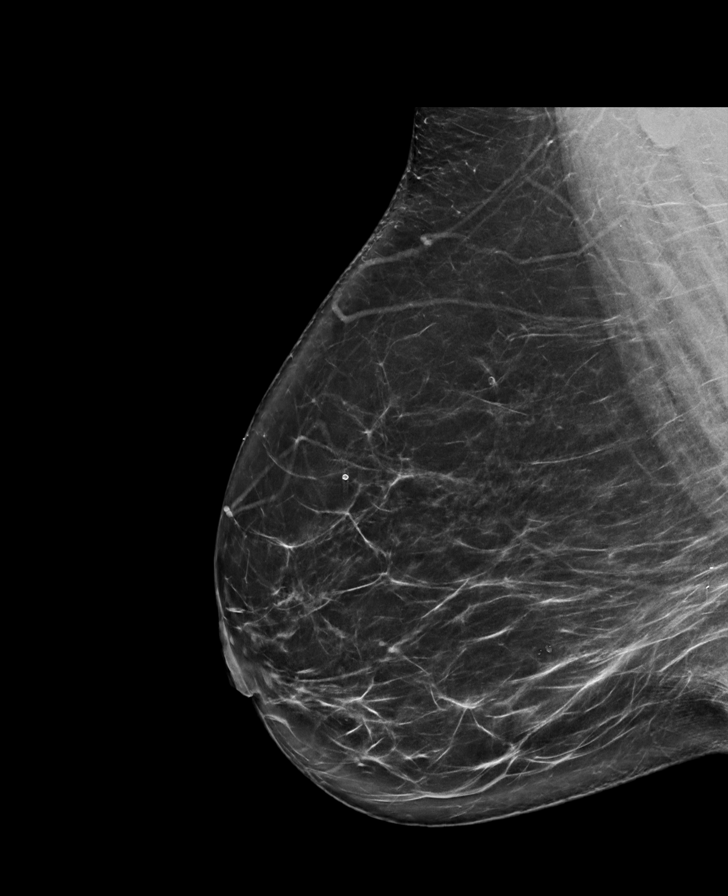

[L MLO synth-2D]
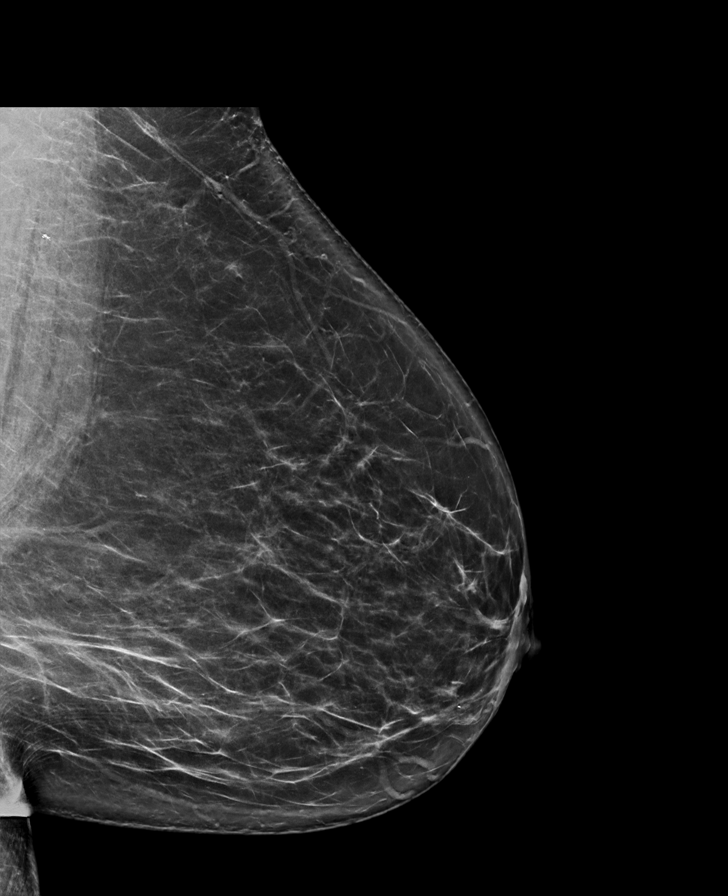

[R CC tomo · tomo slice 35/69.0]
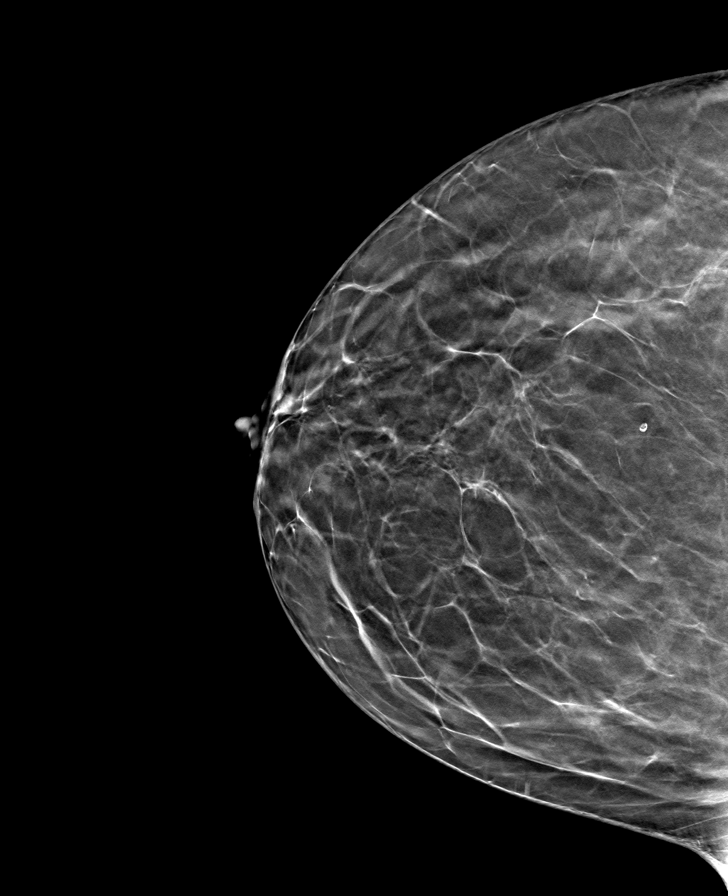

[L MLO tomo · tomo slice 41/82.0]
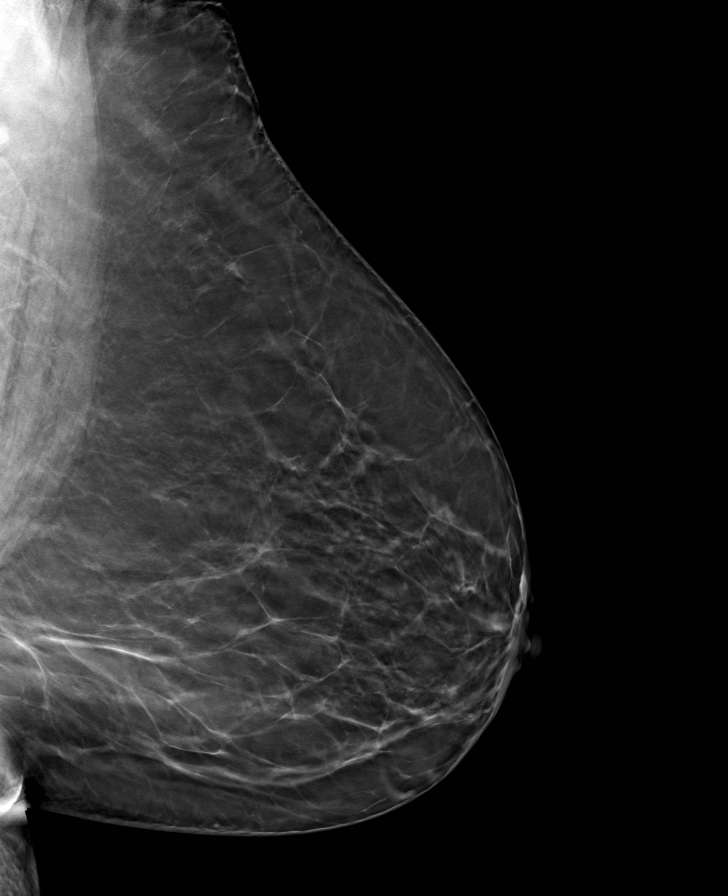

[L CC tomo · tomo slice 35/68.0]
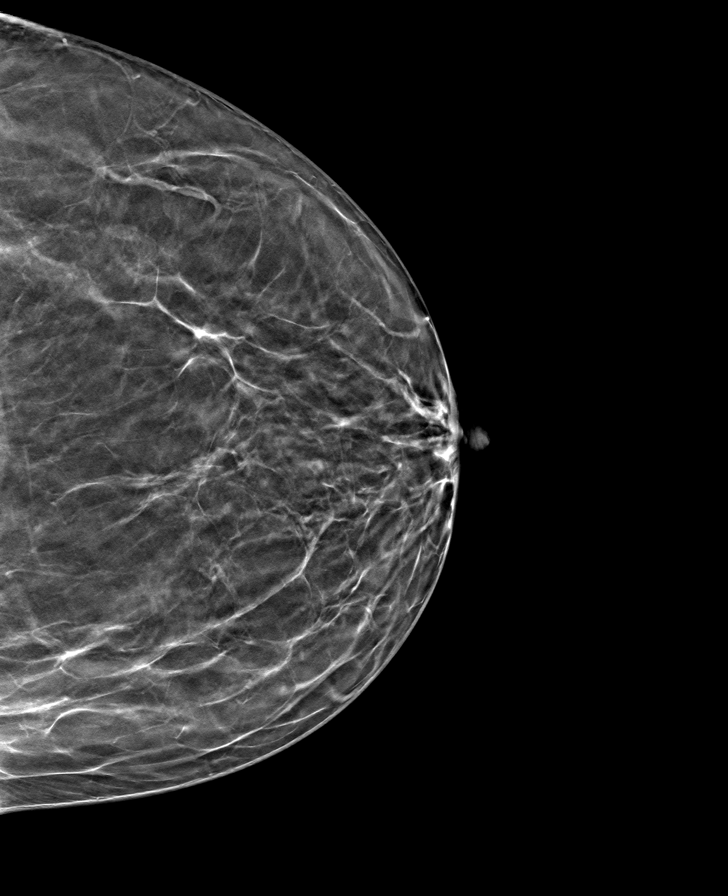

[R MLO tomo · tomo slice 45/88.0]
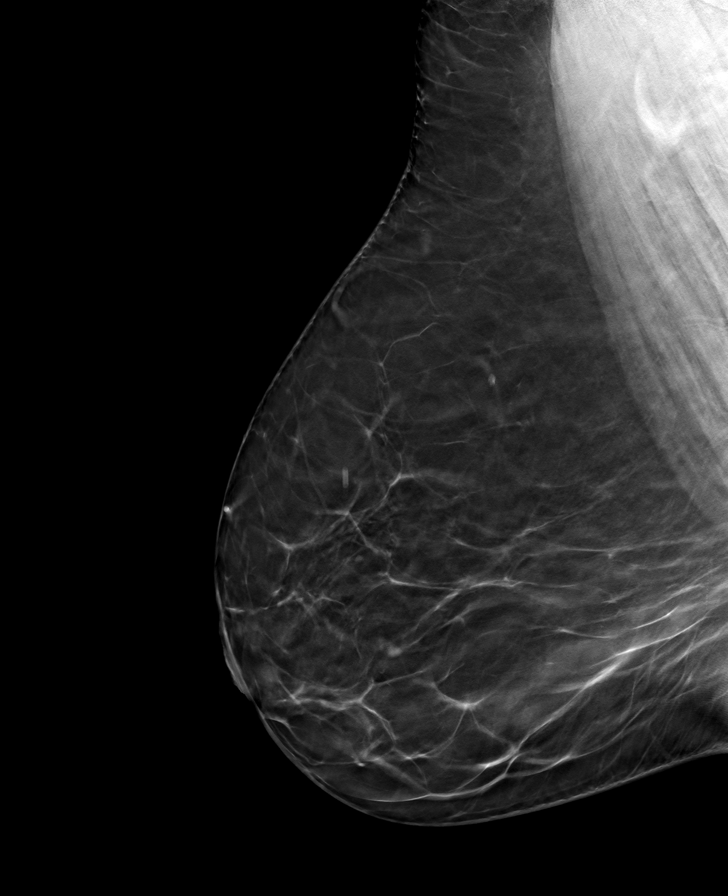

[8 of 24 positions shown; findings below may reference images not displayed]

ACR Breast Density Category b: There are scattered areas of
fibroglandular density.
FINDINGS: There are no findings suspicious for malignancy. Images were
processed with CAD.
IMPRESSION: No mammographic evidence of malignancy. A result letter of this
screening mammogram will be mailed directly to the patient.

RECOMMENDATION:
Screening mammogram in one year. (Code:[id number])

BI-RADS CATEGORY  1: Negative.

## 2021-06-30 ENCOUNTER — Ambulatory Visit: Payer: 59 | Admitting: Podiatry

## 2021-07-02 ENCOUNTER — Other Ambulatory Visit (INDEPENDENT_AMBULATORY_CARE_PROVIDER_SITE_OTHER): Payer: Self-pay | Admitting: Orthopaedic Surgery

## 2021-07-14 ENCOUNTER — Encounter (HOSPITAL_COMMUNITY): Payer: Self-pay

## 2021-07-14 ENCOUNTER — Other Ambulatory Visit: Payer: Self-pay

## 2021-07-14 ENCOUNTER — Ambulatory Visit (HOSPITAL_COMMUNITY)
Admission: EM | Admit: 2021-07-14 | Discharge: 2021-07-14 | Disposition: A | Discharge status: Home / Self Care | Payer: 59 | Attending: Urgent Care | Admitting: Urgent Care

## 2021-07-14 DIAGNOSIS — L910 Hypertrophic scar: Secondary | ICD-10-CM | POA: Diagnosis not present

## 2021-07-14 DIAGNOSIS — L089 Local infection of the skin and subcutaneous tissue, unspecified: Secondary | ICD-10-CM | POA: Diagnosis not present

## 2021-07-14 MED ORDER — DOXYCYCLINE HYCLATE 100 MG PO CAPS: 100.0000 mg | ORAL_CAPSULE | Freq: Two times a day (BID) | ORAL | 0 refills | Status: AC

## 2021-07-14 MED ORDER — MUPIROCIN CALCIUM 2 % NA OINT: TOPICAL_OINTMENT | NASAL | 0 refills | Status: DC

## 2021-07-14 NOTE — ED Triage Notes (Signed)
Pt presents with left ear pain for several months

## 2021-07-14 NOTE — ED Provider Notes (Signed)
[name]    CSN: [id number] Arrival date & time: 07/14/21  0957      History   Chief Complaint Chief Complaint  Patient presents with   Otalgia    left    HPI Jamie Hicks is a 66 y.o. Hicks.   Jamie 66 year old Hicks presents today with concerns of left posterior ear pain for several months.  Jamie Hicks states Jamie Hicks came in today because Jamie Hicks just recently noticed a bump.  Jamie Hicks states that the bump is painful.  Jamie Hicks admits to wearing glasses and masks that seem to rub on her ear.  Jamie Hicks denies any mastoid swelling or tenderness.  Jamie Hicks states Jamie Hicks has been picking at the bump.  Jamie Hicks has not tried any treatments.  Jamie Hicks denies any internal ear symptoms and reports it is all external.  Jamie Hicks does have a history of hypertension but states Jamie Hicks did not take her medications today.  Denies symptoms concerning for uncontrolled hypertension in office.   Otalgia  Past Medical History:  Diagnosis Date   Allergy    Anemia    ARDS (adult respiratory distress syndrome) (HCC)    Arthritis    OA   Back pain    Blind right eye    GERD (gastroesophageal reflux disease)    Hypertension    Thyroid disease     Patient Active Problem List   Diagnosis Date Noted   Snoring 03/09/2018   Daytime sleepiness 03/09/2018   Effusion, left knee 10/13/2017   Chronic pain of left knee 09/26/2017   Unilateral primary osteoarthritis, left knee 09/26/2017   Unilateral primary osteoarthritis, right knee 09/26/2017   Chronic pain of right knee 09/26/2017   Polysubstance abuse ([place]) 96/75/9163   Acute systolic (congestive) heart failure ([place]) 04/25/2017   Acute respiratory failure with hypoxia (HCC)    ARDS (adult respiratory distress syndrome) ([place])    Acute respiratory distress    Community acquired pneumonia of left upper lobe of lung    Hypoxemia    Influenza A 04/18/2017   Left upper lobe pneumonia 04/18/2017   Obesity, Class III, BMI 40-49.9 (morbid obesity) ([place]) 04/18/2017   Thyroid  activity decreased    Cocaine dependence with cocaine-induced mood disorder ([place]) 07/22/2014   Major depressive disorder, recurrent, severe without psychotic features ([place])    Substance induced mood disorder ([place]) 07/19/2014   Suicidal ideation    Homicidal ideation    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   High myopia 10/10/2013   Hx of retinal detachment 10/10/2013   Nuclear sclerosis of both eyes 10/10/2013   Osteoarthritis 09/24/2012   Prediabetes 09/24/2012   Dyslipidemia 09/24/2012   Vitamin D insufficiency 09/24/2012   Hypothyroidism 09/22/2012   Bilateral chronic knee pain 09/22/2012   Smoker 09/22/2012   Hypertension 09/22/2012    Past Surgical History:  Procedure Laterality Date   HERNIA REPAIR     knee arthorscopy      OB History     Gravida  5   Para      Term      Preterm      AB  1   Living  4      SAB      IAB  1   Ectopic      Multiple      Live Births  4            Home Medications    Prior to Admission medications   Medication Sig Start Date End Date Taking? Authorizing  Provider  doxycycline (VIBRAMYCIN) 100 MG capsule Take 1 capsule (100 mg total) by mouth 2 (two) times daily for 5 days. 07/14/21 07/19/21 Yes Jermal Dismuke L, PA  mupirocin nasal ointment (BACTROBAN) 2 % Apply on application three times daily until resolved 07/14/21  Yes Kashton Mcartor L, PA  acetaminophen (TYLENOL) 650 MG CR tablet Take 650 mg by mouth every 8 (eight) hours as needed for pain.    [provider]  ascorbic acid (VITAMIN C) 100 MG tablet Take 100 mg by mouth daily.    [provider]  aspirin EC 81 MG tablet Take 81 mg by mouth daily.    [provider]  atorvastatin (LIPITOR) 20 MG tablet TAKE 1 TABLET BY MOUTH EVERYDAY AT BEDTIME 09/09/20   Hilty, Nadean Corwin, MD  calamine lotion Apply 1 application topically as needed for itching. Patient not taking: Reported on 12/02/2020 10/29/19   Chase Picket, MD  cetirizine (ZYRTEC) 10 MG  tablet TAKE 1 TABLET BY MOUTH EVERY DAY AS NEEDED FOR ALLERGY 02/15/19   [provider]  cholecalciferol (VITAMIN D3) 25 MCG (1000 UT) tablet Take 1,000 Units by mouth daily.    [provider]  cycloSPORINE (RESTASIS) 0.05 % ophthalmic emulsion Place 1 drop into both eyes 2 times daily. 07/11/20   [provider]  diclofenac (VOLTAREN) 75 MG EC tablet TAKE 1 TABLET BY MOUTH TWICE A DAY WITH FOOD AS NEEDED Patient not taking: Reported on 12/02/2020 02/15/19   [provider]  diclofenac sodium (VOLTAREN) 1 % GEL Apply 4 g topically 4 (four) times daily. Patient not taking: Reported on 12/02/2020 07/17/17   Bjorn Pippin, PA-C  famotidine (PEPCID) 20 MG tablet TAKE 1 TABLET BY MOUTH TWICE A DAY 08/30/19   Hilty, Nadean Corwin, MD  folic acid (FOLVITE) 1 MG tablet Take 1 mg by mouth daily. 02/10/19   [provider]  furosemide (LASIX) 20 MG tablet Take 1 tablet (20 mg total) by mouth daily. 09/09/20 10/09/20  Hilty, Nadean Corwin, MD  gabapentin (NEURONTIN) 100 MG capsule TAKE 1 CAPSULE BY MOUTH THREE TIMES A DAY 02/15/19   [provider]  hydroxypropyl methylcellulose / hypromellose (ISOPTO TEARS / GONIOVISC) 2.5 % ophthalmic solution Place 1 drop into both eyes 2 (two) times daily.    [provider]  hydrOXYzine (ATARAX/VISTARIL) 25 MG tablet Take 1 tablet (25 mg total) by mouth every 6 (six) hours. Patient not taking: Reported on 12/02/2020 10/29/19   Chase Picket, MD  ibuprofen (ADVIL) 800 MG tablet TAKE 1 TABLET BY MOUTH EVERY 8 HOURS AS NEEDED 07/02/21   Mcarthur Rossetti, MD  ipratropium (ATROVENT) 0.06 % nasal spray Place 2 sprays into both nostrils 4 (four) times daily. For allergies 06/05/17   Ok Edwards, PA-C  levothyroxine (SYNTHROID) 150 MCG tablet Take 150 mcg by mouth every morning. 08/26/20   [provider]  losartan (COZAAR) 100 MG tablet TAKE 1 TABLET BY MOUTH EVERY DAY 08/27/20   Hilty, Nadean Corwin, MD  Vitamin D,  Ergocalciferol, (DRISDOL) 1.25 MG (50000 UT) CAPS capsule Take 50,000 Units by mouth once a week. 02/10/19   [provider]  loratadine (CLARITIN) 10 MG tablet Take 1 tablet (10 mg total) by mouth daily. (This medicine may be purchased from over there counter at yr local pharmacy): For allergies 06/05/17 10/29/19  Ok Edwards, PA-C    Family History Family History  Problem Relation Age of Onset   Hypertension Mother  Living, currently 38 years old   Cancer Father    Colon cancer Neg Hx    Colon polyps Neg Hx    Esophageal cancer Neg Hx    Rectal cancer Neg Hx    Stomach cancer Neg Hx     Social History Social History   Tobacco Use   Smoking status: Former    Packs/day: 1.00    Years: 40.00    Pack years: 40.00    Types: Cigarettes    Quit date: 05/01/2017    Years since quitting: 4.2   Smokeless tobacco: Never  Vaping Use   Vaping Use: Never used  Substance Use Topics   Alcohol use: Not Currently    Comment: none in the last    Drug use: Not Currently    Types: "Crack" cocaine     Allergies   Amoxicillin, Lisinopril, and Latex   Review of Systems Review of Systems  HENT:  Positive for ear pain.     Physical Exam Triage Vital Signs ED Triage Vitals  Enc Vitals Group     BP 07/14/21 1151 (!) 186/96     Pulse Rate 07/14/21 1151 (!) 58     Resp 07/14/21 1151 16     Temp 07/14/21 1151 97.7 F (36.5 C)     Temp Source 07/14/21 1151 Oral     SpO2 07/14/21 1151 98 %     Weight --      Height --      Head Circumference --      Peak Flow --      Pain Score 07/14/21 1153 4     Pain Loc --      Pain Edu? --      Excl. in [place]? --    No data found.  Updated Vital Signs BP (!) 186/96 (BP Location: Right Arm)    Pulse (!) 58    Temp 97.7 F (36.5 C) (Oral)    Resp 16    LMP 07/21/2014 Comment: current   SpO2 98%   Visual Acuity Right Eye Distance:   Left Eye Distance:   Bilateral Distance:    Right Eye Near:   Left Eye Near:    Bilateral  Near:     Physical Exam Vitals and nursing note reviewed.  Constitutional:      General: Jamie Hicks is not in acute distress.    Appearance: Jamie Hicks is well-developed. Jamie Hicks is obese. Jamie Hicks is not toxic-appearing or diaphoretic.  HENT:     Head: Normocephalic and atraumatic.     Right Ear: Tympanic membrane, ear canal and external ear normal. There is no impacted cerumen.     Left Ear: Tympanic membrane and ear canal normal. There is no impacted cerumen.     Ears:     Comments: There appears to be a small developing keloid to the posterior earlobe.  There appears to be an abrasion to the keloid with a small amount of erythema and drainage.  There is no posterior lymphadenopathy or mastoid tenderness.  Full range of motion of neck without nuchal rigidity.    Nose: Nose normal. No congestion or rhinorrhea.     Mouth/Throat:     Mouth: Mucous membranes are moist.  Eyes:     Conjunctiva/sclera: Conjunctivae normal.  Cardiovascular:     Rate and Rhythm: Normal rate and regular rhythm.     Heart sounds: No murmur heard. Pulmonary:     Effort: Pulmonary effort is normal. No respiratory distress.  Breath sounds: Normal breath sounds.  Abdominal:     Palpations: Abdomen is soft.     Tenderness: There is no abdominal tenderness.  Musculoskeletal:        General: No swelling.     Cervical back: Normal range of motion and neck supple. No rigidity or tenderness.  Lymphadenopathy:     Cervical: No cervical adenopathy.  Skin:    General: Skin is warm and dry.     Capillary Refill: Capillary refill takes less than 2 seconds.     Findings: No erythema or rash.  Neurological:     Mental Status: Jamie Hicks is alert.  Psychiatric:        Mood and Affect: Mood normal.     UC Treatments / Results  Labs (all labs ordered are listed, but only abnormal results are displayed) Labs Reviewed - No data to display  EKG   Radiology No results found.  Procedures Procedures (including critical care  time)  Medications Ordered in UC Medications - No data to display  Initial Impression / Assessment and Plan / UC Course  I have reviewed the triage vital signs and the nursing notes.  Pertinent labs & imaging results that were available during my care of the patient were reviewed by me and considered in my medical decision making (see chart for details).  Clinical Course as of 07/14/21 2128  Tue Jul 14, 2021  1210 Recheck BP 142/93 [WC]    Clinical Course User Index [WC] Geryl Councilman L, PA    Superficial infection of skin -it appears as though patient must have picked at the skin lesion, causing a superficial infection.  We will treat with topical and oral antibiotics. 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 Essential hypertension -recheck blood pressure in office 142/93.  Final Clinical Impressions(s) / UC Diagnoses   Final diagnoses:  Superficial skin infection  Keloid     Discharge Instructions      Use a bowl in place warm water and with Epsom salts.  You can soak your earlobe in the bowl up to 3 times daily Use mupirocin ointment to the back of your earlobe 3 times daily Start taking the antibiotic twice daily, do not stop taking it just because you feel better. Monitor for any adverse reactions. Do not take it within two hours of milk consumption, and avoid multivitamins while on the antibiotic. Please avoid excessive sun exposure or tanning beds while taking. Drink plenty of water.       ED Prescriptions     Medication Sig Dispense Auth. Provider   mupirocin nasal ointment (BACTROBAN) 2 % Apply on application three times daily until resolved 22 g Yousef Huge L, PA   doxycycline (VIBRAMYCIN) 100 MG capsule Take 1 capsule (100 mg total) by mouth 2 (two) times daily for 5 days. 10 capsule Ishan Sanroman L, PA      PDMP not reviewed this encounter.   Chaney Malling, [place] 07/14/21 2129

## 2021-07-14 NOTE — Discharge Instructions (Addendum)
Use a bowl in place warm water and with Epsom salts.  You can soak your earlobe in the bowl up to 3 times daily Use mupirocin ointment to the back of your earlobe 3 times daily Start taking the antibiotic twice daily, do not stop taking it just because you feel better. Monitor for any adverse reactions. Do not take it within two hours of milk consumption, and avoid multivitamins while on the antibiotic. Please avoid excessive sun exposure or tanning beds while taking. Drink plenty of water.

## 2021-07-29 ENCOUNTER — Ambulatory Visit: Payer: 59 | Admitting: Podiatry

## 2021-08-10 ENCOUNTER — Ambulatory Visit: Payer: 59 | Admitting: Podiatry

## 2021-08-20 ENCOUNTER — Other Ambulatory Visit: Payer: Self-pay | Admitting: Internal Medicine

## 2021-09-30 NOTE — Progress Notes (Signed)
? ? ?Office Visit  ?  ?Patient Name: Jamie Hicks ?Date of Encounter: 10/01/2021 ? ?Primary Care Provider:  Nolene Ebbs, MD ?Primary Cardiologist:  Pixie Casino, MD ?Primary Electrophysiologist: None ?Chief Complaint  ?  ?One year Follow up for CHF ? ? Patient Profile: ?HTN ?Polysubstance Abuse ?HFrEF ?OSA on CPAP  ?Thyroid Disease ? ? Recent Studies: ? ?2D Echo 2018: EF 35-40% mod reduced systolic function, concentric LVH ?2D Echo 2019: EF 50-60% with normal LV function, Grade 1 DD ? ?History of Present Illness  ?  ?Jamie Hicks is a 66 y.o. female with PMH of HTN, HFrEF, polysubstance abuse (cocaine and alcohol), medical noncompliance, obstructive sleep apnea on CPAP, and thyroid disease.  She presented to [facility] ED on 04/2017 with worsening dyspnea and productive cough.  She was found to have a positive PCR for influenza A and subsequent D-dimer and CT revealed no evidence of PE.  She was started on azithromycin and Rocephin along with Tamiflu but developed increased confusion and worsening dyspnea that required intubation.  She was extubated on 11/9 and diuresed off 5.3 L with Lasix.  Echo completed with EF of 30-40%, diffuse HK, mild MR and trivial pericardial effusion.  She was discharged on 11/12 and placed on Coreg and low-dose ARB. ? ?She was seen in follow-up by Dr. Debara Pickett on 12/18 and ARB was increased and patient had slight elevation of blood pressure.  Echo was completed with EF improved to 50-60%, and patient maintained sobriety over the year.  She was referred to Dr. Claiborne Billings for sleep study evaluation and was found to have obstructive sleep apnea and started on CPAP therapy.  Patient's blood pressure normalized with adjustments to medication regimen and CPAP combination. Patient was last seen by Dr. Debara Pickett on 08/2020.  At that time she reported noncompliance with her CPAP and patient was overdue for lab work.  Her blood pressure was well controlled ? ?Since last being seen in our clinic Jamie Hicks reports doing doing well.  She reports that she is noncompliant with her CPAP machine due to discomfort.  She has requested a new mask or another sleep study.  She is compliant with her current medications and does endorse some occasional dizziness when standing.  She states that this is been occurring for some time even after discontinuing the beta-blocker. She is down 17 pounds that she has lost intentionally with a fasting. I congratulated her for her weight loss. She is currently not on beta-blocker and states that her PCP removed her from this medication due to hypotension. Today in office her blood pressure was 148/86, on recheck it was 132/78. She is euvolemic on examination today. She does report that she is drinking liquor and smoking marijuana currently due to multiple life stressors.  She is we discussed the importance of abstaining from alcohol and illegal substances due to the long-term effects on her heart and lung health.  She reports that she is abstaining from salt and only uses salt to boiled food. She denies chest pain, palpitations, dyspnea, PND, orthopnea, nausea, vomiting,edema, weight gain, or early satiety. ? ?Past Medical History  ?  ?Past Medical History:  ?Diagnosis Date  ? Allergy   ? Anemia   ? ARDS (adult respiratory distress syndrome) ([place])   ? Arthritis   ? OA  ? Back pain   ? Blind right eye   ? GERD (gastroesophageal reflux disease)   ? Hypertension   ? Thyroid disease   ? ?Past  Surgical History:  ?Procedure Laterality Date  ? HERNIA REPAIR    ? knee arthorscopy    ? ?Allergies ? ?Allergies  ?Allergen Reactions  ? Amoxicillin Swelling  ?  Has patient had a PCN reaction causing immediate rash, facial/tongue/throat swelling, SOB or lightheadedness with hypotension: yes ?Has patient had a PCN reaction causing severe rash involving mucus membranes or skin necrosis: no ?Has patient had a PCN reaction that required hospitalization: yes  ?Has patient had a PCN reaction occurring  within the last 10 years: yes, 08/29/16 ?If all of the above answers are "NO", then may proceed with Cephalosporin use. ?  ? Lisinopril Cough  ? Latex Rash  ? ?Home Medications  ?  ?Current Outpatient Medications  ?Medication Sig Dispense Refill  ? acetaminophen (TYLENOL) 650 MG CR tablet Take 650 mg by mouth every 8 (eight) hours as needed for pain.    ? amLODipine (NORVASC) 2.5 MG tablet Take 1 tablet (2.5 mg total) by mouth daily. 30 tablet 3  ? ascorbic acid (VITAMIN C) 100 MG tablet Take 100 mg by mouth daily.    ? aspirin EC 81 MG tablet Take 81 mg by mouth daily.    ? atorvastatin (LIPITOR) 20 MG tablet TAKE 1 TABLET BY MOUTH EVERYDAY AT BEDTIME 90 tablet 3  ? calamine lotion Apply 1 application topically as needed for itching. 120 mL 0  ? cetirizine (ZYRTEC) 10 MG tablet TAKE 1 TABLET BY MOUTH EVERY DAY AS NEEDED FOR ALLERGY    ? cholecalciferol (VITAMIN D3) 25 MCG (1000 UT) tablet Take 1,000 Units by mouth daily.    ? cycloSPORINE (RESTASIS) 0.05 % ophthalmic emulsion Place 1 drop into both eyes 2 times daily.    ? famotidine (PEPCID) 20 MG tablet TAKE 1 TABLET BY MOUTH TWICE A DAY 180 tablet 1  ? folic acid (FOLVITE) 1 MG tablet Take 1 mg by mouth daily.    ? hydroxypropyl methylcellulose / hypromellose (ISOPTO TEARS / GONIOVISC) 2.5 % ophthalmic solution Place 1 drop into both eyes 2 (two) times daily.    ? hydrOXYzine (ATARAX/VISTARIL) 25 MG tablet Take 1 tablet (25 mg total) by mouth every 6 (six) hours. 20 tablet 0  ? ibuprofen (ADVIL) 800 MG tablet TAKE 1 TABLET BY MOUTH EVERY 8 HOURS AS NEEDED 90 tablet 3  ? ipratropium (ATROVENT) 0.06 % nasal spray Place 2 sprays into both nostrils 4 (four) times daily. For allergies 30 mL 0  ? levothyroxine (SYNTHROID) 150 MCG tablet Take 150 mcg by mouth every morning.    ? losartan (COZAAR) 100 MG tablet TAKE 1 TABLET BY MOUTH EVERY DAY 90 tablet 0  ? mupirocin nasal ointment (BACTROBAN) 2 % Apply on application three times daily until resolved 22 g 0  ? Vitamin  D, Ergocalciferol, (DRISDOL) 1.25 MG (50000 UT) CAPS capsule Take 50,000 Units by mouth once a week.    ? diclofenac (VOLTAREN) 75 MG EC tablet TAKE 1 TABLET BY MOUTH TWICE A DAY WITH FOOD AS NEEDED (Patient not taking: Reported on 12/02/2020)    ? diclofenac sodium (VOLTAREN) 1 % GEL Apply 4 g topically 4 (four) times daily. (Patient not taking: Reported on 12/02/2020) 2 Tube 3  ? furosemide (LASIX) 20 MG tablet Take 1 tablet (20 mg total) by mouth daily. 90 tablet 3  ? gabapentin (NEURONTIN) 100 MG capsule TAKE 1 CAPSULE BY MOUTH THREE TIMES A DAY (Patient not taking: Reported on 10/01/2021)    ? gabapentin (NEURONTIN) 300 MG capsule Take 300 mg  by mouth 3 (three) times daily. (Patient not taking: Reported on 10/01/2021)    ? ?Current Facility-Administered Medications  ?Medication Dose Route Frequency Provider Last Rate Last Admin  ? 0.9 %  sodium chloride infusion  500 mL Intravenous Once Nandigam, Venia Minks, MD      ?  ? ?Review of Systems  ?Please see the history of present illness.    ?(+) Dizziness with standing ?(+) Seasonal allergies ? ?All other systems reviewed and are otherwise negative except as noted above. ? ?Physical Exam  ?  ?Wt Readings from Last 3 Encounters:  ?10/01/21 238 lb (108 kg)  ?12/09/20 254 lb 3.2 oz (115.3 kg)  ?12/02/20 250 lb 12.8 oz (113.8 kg)  ? ?VS: ?Vitals:  ? 10/01/21 1050  ?BP: (!) 148/86  ?,Body mass index is 36.19 kg/m?. ? ?Constitutional:   ?   Appearance: Healthy appearance. Not in distress.  ?Neck:  ?   Vascular: JVD normal.  ?Pulmonary:  ?   Effort: Pulmonary effort is normal.  ?   Breath sounds: Slight wheezing. No rales.  ?Cardiovascular:  ?   Normal rate. Regular rhythm. Normal S1. Normal S2.   ?   Murmurs: There is no murmur.  ?Edema: ?   Peripheral edema absent.  ?Abdominal:  ?   Palpations: Abdomen is soft. There is no hepatomegaly.  ?Skin: ?   General: Skin is warm and dry.  ?Neurological:  ?   General: No focal deficit present.  ?   Mental Status: Alert and oriented to  person, place and time.  ?   Cranial Nerves: Cranial nerves are intact.  ?EKG/LABS/Other Studies Reviewed  ?  ?ECG personally reviewed by me today -sinus bradycardia with PACs and incomplete left bun

## 2021-10-01 ENCOUNTER — Ambulatory Visit (INDEPENDENT_AMBULATORY_CARE_PROVIDER_SITE_OTHER): Payer: 59 | Admitting: Nurse Practitioner

## 2021-10-01 ENCOUNTER — Ambulatory Visit: Payer: 59

## 2021-10-01 ENCOUNTER — Encounter: Payer: Self-pay | Admitting: Nurse Practitioner

## 2021-10-01 VITALS — BP 148/86 | Wt 238.0 lb

## 2021-10-01 DIAGNOSIS — E785 Hyperlipidemia, unspecified: Secondary | ICD-10-CM | POA: Diagnosis not present

## 2021-10-01 DIAGNOSIS — I5022 Chronic systolic (congestive) heart failure: Secondary | ICD-10-CM

## 2021-10-01 DIAGNOSIS — R001 Bradycardia, unspecified: Secondary | ICD-10-CM

## 2021-10-01 DIAGNOSIS — G4733 Obstructive sleep apnea (adult) (pediatric): Secondary | ICD-10-CM | POA: Diagnosis not present

## 2021-10-01 DIAGNOSIS — I493 Ventricular premature depolarization: Secondary | ICD-10-CM

## 2021-10-01 DIAGNOSIS — I10 Essential (primary) hypertension: Secondary | ICD-10-CM

## 2021-10-01 MED ORDER — AMLODIPINE BESYLATE 2.5 MG PO TABS: 2.5000 mg | ORAL_TABLET | Freq: Every day | ORAL | 3 refills | Status: DC

## 2021-10-01 NOTE — Progress Notes (Unsigned)
Enrolled patient for a 7 day Zio XT monitor to be mailed to patients home   Dr Hilty to read 

## 2021-10-01 NOTE — Patient Instructions (Signed)
Medication Instructions:  ?START AMLODIPINE 2.'5MG'$  DAILY ? ?*If you need a refill on your cardiac medications before your next appointment, please call your pharmacy* ? ?Lab Work:     ?BMET, CBC AND TSH TODAY     ?If you have labs (blood work) drawn today and your tests are completely normal, you will receive your results only by: ?MyChart Message (if you have MyChart) OR  A paper copy in the mail ?If you have any lab test that is abnormal or we need to change your treatment, we will call you to review the results. ? ?Testing/Procedures: ?TAKE AND LOG YOUR BLOOD PRESSURE DAILY ? ?7 DAY ZIO MONITOR-THIS WILL BE MAILED TO YOU ? ?Follow-Up: ?Your next appointment:  6-8 week(s) In Person with Pixie Casino, MD  or  Margarite Gouge, JR      ?At [facility], you and your health needs are our priority.  As part of our continuing mission to provide you with exceptional heart care, we have created designated Provider Care Teams.  These Care Teams include your primary Cardiologist (physician) and Advanced Practice Providers (APPs -  Physician Assistants and Nurse Practitioners) who all work together to provide you with the care you need, when you need it. ? ?We recommend signing up for the patient portal called "MyChart".  Sign up information is provided on this After Visit Summary.  MyChart is used to connect with patients for Virtual Visits (Telemedicine).  Patients are able to view lab/test results, encounter notes, upcoming appointments, etc.  Non-urgent messages can be sent to your provider as well.   ?To learn more about what you can do with MyChart, go to [email or web address].   ? ? ? ?Important Information About Sugar ? ? ? ? ? ? ? ?  ? ?

## 2021-10-02 LAB — BASIC METABOLIC PANEL
BUN/Creatinine Ratio: 13 (ref 12–28)
BUN: 14 mg/dL (ref 8–27)
CO2: 21 mmol/L (ref 20–29)
Calcium: 10.2 mg/dL (ref 8.7–10.3)
Chloride: 105 mmol/L (ref 96–106)
Creatinine, Ser: 1.11 mg/dL — ABNORMAL HIGH (ref 0.57–1.00)
Glucose: 84 mg/dL (ref 70–99)
Potassium: 4.1 mmol/L (ref 3.5–5.2)
Sodium: 144 mmol/L (ref 134–144)
eGFR: 56 mL/min/{1.73_m2} — ABNORMAL LOW (ref >59)

## 2021-10-02 LAB — CBC
Hematocrit: 39.6 % (ref 34.0–46.6)
Hemoglobin: 13 g/dL (ref 11.1–15.9)
MCH: 27.7 pg (ref 26.6–33.0)
MCHC: 32.8 g/dL (ref 31.5–35.7)
MCV: 84 fL (ref 79–97)
Platelets: 214 10*3/uL (ref 150–450)
RBC: 4.69 x10E6/uL (ref 3.77–5.28)
RDW: 15.1 % (ref 11.7–15.4)
WBC: 7.1 10*3/uL (ref 3.4–10.8)

## 2021-10-02 LAB — TSH: TSH: 13.6 u[IU]/mL — ABNORMAL HIGH (ref 0.450–4.500)

## 2021-10-08 ENCOUNTER — Telehealth: Payer: Self-pay | Admitting: Nurse Practitioner

## 2021-10-08 NOTE — Telephone Encounter (Signed)
Patient returning call for lab results. Phone: [phone] ?

## 2021-10-08 NOTE — Telephone Encounter (Signed)
Spoke with pt and advised pt NP has reviewed labs and recommendations are as follow: ? ?Good morning, please let Jamie Hicks know that her thyroid hormone is elevated indicating poor compliance or adjustment needed for Synthroid. Please advised her to follow-up with her PCP or endocrinologist for treatment options.  Her renal function is slightly elevated and may indicate some dehydration.  Please advise her to hydrate with at least 64 ounces of water daily. ?  ?Thank you, ?  ?Ambrose Pancoast, NP ? ?Pt verbalizes understanding and thanked Therapist, sports for the call. ?

## 2021-11-02 NOTE — Progress Notes (Unsigned)
Office Visit    Patient Name: Jamie Hicks Date of Encounter: 11/02/2021  Primary Care Provider:  Nolene Ebbs, MD Primary Cardiologist:  Pixie Casino, MD Primary Electrophysiologist: None  Chief Complaint    Jamie Hicks is a 66 y.o. female with PMH of  HTN, HFrEF, polysubstance abuse (cocaine and alcohol), medical noncompliance, obstructive sleep apnea on CPAP, and thyroid disease presents today for 6 week follow up.  Past Medical History    Past Medical History:  Diagnosis Date   Allergy    Anemia    ARDS (adult respiratory distress syndrome) (HCC)    Arthritis    OA   Back pain    Blind right eye    GERD (gastroesophageal reflux disease)    Hypertension    Thyroid disease    Past Surgical History:  Procedure Laterality Date   HERNIA REPAIR     knee arthorscopy      Allergies  Allergies  Allergen Reactions   Amoxicillin Swelling    Has patient had a PCN reaction causing immediate rash, facial/tongue/throat swelling, SOB or lightheadedness with hypotension: yes Has patient had a PCN reaction causing severe rash involving mucus membranes or skin necrosis: no Has patient had a PCN reaction that required hospitalization: yes  Has patient had a PCN reaction occurring within the last 10 years: yes, 08/29/16 If all of the above answers are "NO", then may proceed with Cephalosporin use.    Lisinopril Cough   Latex Rash    History of Present Illness    Jamie Hicks has a PMH ofHTN, HFrEF, polysubstance abuse (cocaine and alcohol), medical noncompliance, obstructive sleep apnea on CPAP, and thyroid disease.  She presented to [facility] ED on 04/2017 with worsening dyspnea and productive cough.  She was found to have a positive PCR for influenza A and subsequent D-dimer and CT revealed no evidence of PE. She was started on azithromycin and Rocephin along with Tamiflu but developed increased confusion and worsening dyspnea that required intubation.  She  was extubated on 11/9 and diuresed off 5.3 L with Lasix.  Echo completed with EF of 30-40%, diffuse HK, mild MR and trivial pericardial effusion.  She was discharged on 11/12 and placed on Coreg and low-dose ARB.   She was seen in follow-up by Dr. Debara Pickett on 12/18 and ARB was increased and patient had slight elevation of blood pressure.  Echo was completed with EF improved to 50-60%, and patient maintained sobriety over the year.  She was referred to Dr. Claiborne Billings for sleep study evaluation and was found to have obstructive sleep apnea and started on CPAP therapy. Patient's blood pressure normalized with adjustments to medication regimen and CPAP combination. Patient was last seen by Dr. Debara Pickett on 08/2020.  At that time she reported noncompliance with her CPAP and patient was overdue for lab work.  Her blood pressure was well controlled   She was seen last in office on 4/20 and her blood pressure was 148/86, on recheck it was 132/78. She is euvolemic on examination today. She does report that she is drinking liquor and smoking marijuana currently due to multiple life stressors. She was bradycardiac with HR of 45 in the office and was placed on 7 day Zio monitor. Amlodipine 2.5 mg was added due to elevated B/P.  Since last being seen in the office patient reports***.  Patient denies chest pain, palpitations, dyspnea, PND, orthopnea, nausea, vomiting, dizziness, syncope, edema, weight gain, or early satiety.     ***  Notes: -Needs Echo -Review Zio monitor results still pending  2D Echo to evaluate EF last was 2019 Mg today and Med compliance Home Medications    Current Outpatient Medications  Medication Sig Dispense Refill   acetaminophen (TYLENOL) 650 MG CR tablet Take 650 mg by mouth every 8 (eight) hours as needed for pain.     amLODipine (NORVASC) 2.5 MG tablet Take 1 tablet (2.5 mg total) by mouth daily. 30 tablet 3   ascorbic acid (VITAMIN C) 100 MG tablet Take 100 mg by mouth daily.     aspirin EC  81 MG tablet Take 81 mg by mouth daily.     atorvastatin (LIPITOR) 20 MG tablet TAKE 1 TABLET BY MOUTH EVERYDAY AT BEDTIME 90 tablet 3   calamine lotion Apply 1 application topically as needed for itching. 120 mL 0   cetirizine (ZYRTEC) 10 MG tablet TAKE 1 TABLET BY MOUTH EVERY DAY AS NEEDED FOR ALLERGY     cholecalciferol (VITAMIN D3) 25 MCG (1000 UT) tablet Take 1,000 Units by mouth daily.     cycloSPORINE (RESTASIS) 0.05 % ophthalmic emulsion Place 1 drop into both eyes 2 times daily.     diclofenac (VOLTAREN) 75 MG EC tablet TAKE 1 TABLET BY MOUTH TWICE A DAY WITH FOOD AS NEEDED (Patient not taking: Reported on 12/02/2020)     diclofenac sodium (VOLTAREN) 1 % GEL Apply 4 g topically 4 (four) times daily. (Patient not taking: Reported on 12/02/2020) 2 Tube 3   famotidine (PEPCID) 20 MG tablet TAKE 1 TABLET BY MOUTH TWICE A DAY 638 tablet 1   folic acid (FOLVITE) 1 MG tablet Take 1 mg by mouth daily.     furosemide (LASIX) 20 MG tablet Take 1 tablet (20 mg total) by mouth daily. 90 tablet 3   gabapentin (NEURONTIN) 100 MG capsule TAKE 1 CAPSULE BY MOUTH THREE TIMES A DAY (Patient not taking: Reported on 10/01/2021)     gabapentin (NEURONTIN) 300 MG capsule Take 300 mg by mouth 3 (three) times daily. (Patient not taking: Reported on 10/01/2021)     hydroxypropyl methylcellulose / hypromellose (ISOPTO TEARS / GONIOVISC) 2.5 % ophthalmic solution Place 1 drop into both eyes 2 (two) times daily.     hydrOXYzine (ATARAX/VISTARIL) 25 MG tablet Take 1 tablet (25 mg total) by mouth every 6 (six) hours. 20 tablet 0   ibuprofen (ADVIL) 800 MG tablet TAKE 1 TABLET BY MOUTH EVERY 8 HOURS AS NEEDED 90 tablet 3   ipratropium (ATROVENT) 0.06 % nasal spray Place 2 sprays into both nostrils 4 (four) times daily. For allergies 30 mL 0   levothyroxine (SYNTHROID) 150 MCG tablet Take 150 mcg by mouth every morning.     losartan (COZAAR) 100 MG tablet TAKE 1 TABLET BY MOUTH EVERY DAY 90 tablet 0   mupirocin nasal  ointment (BACTROBAN) 2 % Apply on application three times daily until resolved 22 g 0   Vitamin D, Ergocalciferol, (DRISDOL) 1.25 MG (50000 UT) CAPS capsule Take 50,000 Units by mouth once a week.     Current Facility-Administered Medications  Medication Dose Route Frequency Provider Last Rate Last Admin   0.9 %  sodium chloride infusion  500 mL Intravenous Once Nandigam, Kavitha V, MD         Review of Systems  Please see the history of present illness.    (+)*** (+)***  All other systems reviewed and are otherwise negative except as noted above.  Physical Exam    Wt Readings from Last  3 Encounters:  10/01/21 238 lb (108 kg)  12/09/20 254 lb 3.2 oz (115.3 kg)  12/02/20 250 lb 12.8 oz (113.8 kg)   NO:BSJGG were no vitals filed for this visit.,There is no height or weight on file to calculate BMI.  Constitutional:      Appearance: Healthy appearance. Not in distress.  Neck:     Vascular: JVD normal.  Pulmonary:     Effort: Pulmonary effort is normal.     Breath sounds: No wheezing. No rales. Diminished in the bases Cardiovascular:     Normal rate. Regular rhythm. Normal S1. Normal S2.      Murmurs: There is no murmur.  Edema:    Peripheral edema absent.  Abdominal:     Palpations: Abdomen is soft non tender. There is no hepatomegaly.  Skin:    General: Skin is warm and dry.  Neurological:     General: No focal deficit present.     Mental Status: Alert and oriented to person, place and time.     Cranial Nerves: Cranial nerves are intact.  EKG/LABS/Other Studies Reviewed    ECG personally reviewed by me today - ***  Risk Assessment/Calculations:   {Does this patient have ATRIAL FIBRILLATION?:[phone]}        Lab Results  Component Value Date   WBC 7.1 10/01/2021   HGB 13.0 10/01/2021   HCT 39.6 10/01/2021   MCV 84 10/01/2021   PLT 214 10/01/2021   Lab Results  Component Value Date   CREATININE 1.11 (H) 10/01/2021   BUN 14 10/01/2021   NA 144 10/01/2021    K 4.1 10/01/2021   CL 105 10/01/2021   CO2 21 10/01/2021   Lab Results  Component Value Date   ALT 19 09/09/2020   AST 18 09/09/2020   ALKPHOS 96 09/09/2020   BILITOT 0.3 09/09/2020   Lab Results  Component Value Date   CHOL 132 09/09/2020   HDL 37 (L) 09/09/2020   LDLCALC 72 09/09/2020   TRIG 126 09/09/2020   CHOLHDL 3.6 09/09/2020    Lab Results  Component Value Date   HGBA1C 6.2 (H) 09/09/2020    Assessment & Plan    1.HFrEF:  2.HTN:  3.HLD:  4.OSA:  5. Tobacco Abuse:      Disposition: Follow-up with Pixie Casino, MD or APP in *** months {Are you ordering a CV Procedure (e.g. stress test, cath, DCCV, TEE, etc)?   Press F2        :[id number]}   Medication Adjustments/Labs and Tests Ordered: Current medicines are reviewed at length with the patient today.  Concerns regarding medicines are outlined above.   Signed, Mable Fill, Marissa Nestle, NP 11/02/2021, 1:22 PM [place]

## 2021-11-04 ENCOUNTER — Ambulatory Visit: Payer: 59 | Admitting: Physician Assistant

## 2021-11-04 DIAGNOSIS — E785 Hyperlipidemia, unspecified: Secondary | ICD-10-CM

## 2021-11-04 DIAGNOSIS — I10 Essential (primary) hypertension: Secondary | ICD-10-CM

## 2021-11-04 DIAGNOSIS — F17200 Nicotine dependence, unspecified, uncomplicated: Secondary | ICD-10-CM

## 2021-11-04 DIAGNOSIS — G4733 Obstructive sleep apnea (adult) (pediatric): Secondary | ICD-10-CM

## 2021-11-04 DIAGNOSIS — I5022 Chronic systolic (congestive) heart failure: Secondary | ICD-10-CM

## 2021-11-11 NOTE — Progress Notes (Unsigned)
Office Visit    Patient Name: Jamie Hicks Date of Encounter: 11/12/2021  Primary Care Provider:  Nolene Ebbs, MD Primary Cardiologist:  Pixie Casino, MD Primary Electrophysiologist: None  Chief Complaint   Jamie Hicks is a 66 y.o. female with PMH of  HTN, HFrEF, polysubstance abuse (cocaine and alcohol), medical noncompliance, obstructive sleep apnea on CPAP, and thyroid disease presents today for 8 week follow up for palpitations and heart failure.  Past Medical History    Past Medical History:  Diagnosis Date   Allergy    Anemia    ARDS (adult respiratory distress syndrome) (HCC)    Arthritis    OA   Back pain    Blind right eye    GERD (gastroesophageal reflux disease)    Hypertension    Thyroid disease    Past Surgical History:  Procedure Laterality Date   HERNIA REPAIR     knee arthorscopy      Allergies  Allergies  Allergen Reactions   Amoxicillin Swelling    Has patient had a PCN reaction causing immediate rash, facial/tongue/throat swelling, SOB or lightheadedness with hypotension: yes Has patient had a PCN reaction causing severe rash involving mucus membranes or skin necrosis: no Has patient had a PCN reaction that required hospitalization: yes  Has patient had a PCN reaction occurring within the last 10 years: yes, 08/29/16 If all of the above answers are "NO", then may proceed with Cephalosporin use.    Lisinopril Cough   Latex Rash    History of Present Illness    Jamie Hicks is a 66 year old with the above mentioned past medical history. She presented to [facility] ED on 04/2017 with worsening dyspnea and productive cough.  She was found to have a positive PCR for influenza A and subsequent D-dimer and CT revealed no evidence of PE. She was started on azithromycin and Rocephin along with Tamiflu but developed increased confusion and worsening dyspnea that required intubation.  She was extubated on 11/9 and diuresed off 5.3 L  with Lasix. Echo completed with EF of 30-40%, diffuse HK, mild MR and trivial pericardial effusion.  She was discharged on 11/12 and placed on Coreg and low-dose ARB.  She was seen in follow-up by Dr. Debara Pickett on 12/18 and ARB was increased and patient had slight elevation of blood pressure. Echo was completed with EF improved to 50-60%, and patient maintained sobriety over the year.  She was referred to Dr. Claiborne Billings for sleep study evaluation and was found to have obstructive sleep apnea and started on CPAP therapy. Patient's blood pressure normalized with adjustments to medication regimen and CPAP combination. Patient was last seen by Dr. Debara Pickett on 08/2020.  At that time she reported noncompliance with her CPAP and patient was overdue for lab work.  Her blood pressure was well controlled   She was seen in office on 4/20 and her blood pressure was 148/86, on recheck it was 132/78. She was euvolemic on examination. She reported that she was drinking liquor and smoking marijuana currently due to multiple life stressors. She was bradycardiac with HR of 45 with PVCs in the office and was placed on 7 day Zio monitor for dizziness. Amlodipine 2.5 mg was added due to elevated B/P. Her TSH was found to be elevated, possibly contributing to the issue.  Since last being seen in the office patient reports doing well since her last appointment.  She is recently moved to a new apartment and  states that her ZIO monitor was sent to her old address and therefore she never received it.  EKG today was much improved with no PVCs and heart rate of 59.  She also has no complaints of dizziness or fatigue.  She is now compliant with her Synthroid and her PCP is following her levels currently.  She is interested in purchasing a new mask for her CPAP machine and is motivated to be compliant.  She is tolerating her medications and is also compliant.  Patient denies chest pain, palpitations, dyspnea, PND, orthopnea, nausea, vomiting, dizziness,  syncope, edema, weight gain, or early satiety.  Home Medications    Current Outpatient Medications  Medication Sig Dispense Refill   acetaminophen (TYLENOL) 650 MG CR tablet Take 650 mg by mouth every 8 (eight) hours as needed for pain.     amLODipine (NORVASC) 2.5 MG tablet Take 1 tablet (2.5 mg total) by mouth daily. 30 tablet 3   ascorbic acid (VITAMIN C) 100 MG tablet Take 100 mg by mouth daily.     aspirin EC 81 MG tablet Take 81 mg by mouth daily.     atorvastatin (LIPITOR) 20 MG tablet TAKE 1 TABLET BY MOUTH EVERYDAY AT BEDTIME 90 tablet 3   calamine lotion Apply 1 application topically as needed for itching. 120 mL 0   cetirizine (ZYRTEC) 10 MG tablet TAKE 1 TABLET BY MOUTH EVERY DAY AS NEEDED FOR ALLERGY     cycloSPORINE (RESTASIS) 0.05 % ophthalmic emulsion Place 1 drop into both eyes 2 times daily.     famotidine (PEPCID) 20 MG tablet TAKE 1 TABLET BY MOUTH TWICE A DAY 419 tablet 1   folic acid (FOLVITE) 1 MG tablet Take 1 mg by mouth daily.     furosemide (LASIX) 20 MG tablet Take 1 tablet (20 mg total) by mouth daily. 90 tablet 3   gabapentin (NEURONTIN) 300 MG capsule Take 300 mg by mouth 3 (three) times daily.     hydroxypropyl methylcellulose / hypromellose (ISOPTO TEARS / GONIOVISC) 2.5 % ophthalmic solution Place 1 drop into both eyes 2 (two) times daily.     hydrOXYzine (ATARAX/VISTARIL) 25 MG tablet Take 1 tablet (25 mg total) by mouth every 6 (six) hours. 20 tablet 0   ibuprofen (ADVIL) 800 MG tablet TAKE 1 TABLET BY MOUTH EVERY 8 HOURS AS NEEDED 90 tablet 3   ipratropium (ATROVENT) 0.06 % nasal spray Place 2 sprays into both nostrils 4 (four) times daily. For allergies 30 mL 0   levothyroxine (SYNTHROID) 150 MCG tablet Take 150 mcg by mouth every morning.     losartan (COZAAR) 100 MG tablet TAKE 1 TABLET BY MOUTH EVERY DAY 90 tablet 0   mupirocin nasal ointment (BACTROBAN) 2 % Apply on application three times daily until resolved 22 g 0   Vitamin D, Ergocalciferol,  (DRISDOL) 1.25 MG (50000 UT) CAPS capsule Take 50,000 Units by mouth once a week.     Current Facility-Administered Medications  Medication Dose Route Frequency Provider Last Rate Last Admin   0.9 %  sodium chloride infusion  500 mL Intravenous Once Nandigam, Venia Minks, MD         Review of Systems  Please see the history of present illness.    All other systems reviewed and are otherwise negative except as noted above.  Physical Exam    Wt Readings from Last 3 Encounters:  11/12/21 232 lb (105.2 kg)  10/01/21 238 lb (108 kg)  12/09/20 254 lb 3.2 oz (115.3  kg)   VS: Vitals:   11/12/21 1353  BP: 122/70  Pulse: (!) 59  SpO2: 99%  ,Body mass index is 35.28 kg/m.  Constitutional:      Appearance: Healthy appearance. Not in distress.  Neck:     Vascular: JVD normal.  Pulmonary:     Effort: Pulmonary effort is normal.     Breath sounds: No wheezing. No rales. Diminished in the bases Cardiovascular:     Normal rate. Regular rhythm. Normal S1. Normal S2.      Murmurs: There is no murmur.  Edema:    Peripheral edema absent.  Abdominal:     Palpations: Abdomen is soft non tender. There is no hepatomegaly.  Skin:    General: Skin is warm and dry.  Neurological:     General: No focal deficit present.     Mental Status: Alert and oriented to person, place and time.     Cranial Nerves: Cranial nerves are intact.  EKG/LABS/Other Studies Reviewed    ECG personally reviewed by me today -sinus bradycardia with heart rate of 59 and no acute changes  Lab Results  Component Value Date   WBC 7.1 10/01/2021   HGB 13.0 10/01/2021   HCT 39.6 10/01/2021   MCV 84 10/01/2021   PLT 214 10/01/2021   Lab Results  Component Value Date   CREATININE 1.11 (H) 10/01/2021   BUN 14 10/01/2021   NA 144 10/01/2021   K 4.1 10/01/2021   CL 105 10/01/2021   CO2 21 10/01/2021   Lab Results  Component Value Date   ALT 19 09/09/2020   AST 18 09/09/2020   ALKPHOS 96 09/09/2020   BILITOT 0.3  09/09/2020   Lab Results  Component Value Date   CHOL 132 09/09/2020   HDL 37 (L) 09/09/2020   LDLCALC 72 09/09/2020   TRIG 126 09/09/2020   CHOLHDL 3.6 09/09/2020    Lab Results  Component Value Date   HGBA1C 6.2 (H) 09/09/2020    Assessment & Plan    1.HFrEF with recovered LV function: -Last echo completed 2019 with EF normalized at 55-60%, we will plan for repeat echo to reevaluate her current EF and heart function since she had begun using substances again recently -Patient is euvolemic on examination today and states that she is abstaining from excess sodium in her diet. -Continue losartan 100 mg daily and Lasix 20 mg daily -Not on bb due to bradycardia  -Low sodium diet, fluid restriction <2L, and daily weights encouraged. Educated to contact our office for weight gain of 2 lbs overnight or 5 lbs in one week.   2.HTN: -Blood pressure today was 122/70 well-controlled and at goal of less than 130/80. -Continue losartan 100 mg daily and amlodipine 2.5 mg daily  3. Palpitations with sinus bradycardia and PVCs last visit: -Patient unfortunately did not receive her a ZIO monitor due to incorrect mailing address provided. -EKG today was sinus bradycardia without PVCs, HR improved to 50s after resuming levothyroxine -She is currently without any complaints of palpitation, fatigue, SOB so will hold off on revisiting monitor -Obtain baseline magnesium level today to evaluate for possible depletion and hypomagnesia   4.HLD: -Most recent LDL was 72 almost at goal of less than 70 -Continue Lipitor 20 mg daily   5.OSA: -Patient is currently without CPAP mask and would like to purchase 1 to resume usage. -I will reach out to Jaclyn Prime Gershon Cull) Ronnald Ramp to request information regarding CPAP mask replacement   6.  Polysubstance abuse: -Patient states that she is still indulging in wine and the marijuana occasionally. -She was counseled on the importance of abstaining from marijuana  and alcohol in the setting of heart failure and thyroid disease.  7. Hypothyroidism: -Currently followed by PCP and is now compliant with Synthroid -She states that her levels will be rechecked next week  Disposition: Follow-up with Pixie Casino, MD or APP in 2 months    Medication Adjustments/Labs and Tests Ordered: Current medicines are reviewed at length with the patient today.  Concerns regarding medicines are outlined above.   Signed, Mable Fill, Marissa Nestle, NP 11/12/2021, 2:53 PM [place]

## 2021-11-12 ENCOUNTER — Ambulatory Visit (INDEPENDENT_AMBULATORY_CARE_PROVIDER_SITE_OTHER): Payer: 59 | Admitting: Nurse Practitioner

## 2021-11-12 ENCOUNTER — Encounter: Payer: Self-pay | Admitting: Nurse Practitioner

## 2021-11-12 VITALS — BP 122/70 | HR 59 | Ht 68.0 in | Wt 232.0 lb

## 2021-11-12 DIAGNOSIS — F1910 Other psychoactive substance abuse, uncomplicated: Secondary | ICD-10-CM

## 2021-11-12 DIAGNOSIS — I5020 Unspecified systolic (congestive) heart failure: Secondary | ICD-10-CM | POA: Diagnosis not present

## 2021-11-12 DIAGNOSIS — G4733 Obstructive sleep apnea (adult) (pediatric): Secondary | ICD-10-CM

## 2021-11-12 DIAGNOSIS — E039 Hypothyroidism, unspecified: Secondary | ICD-10-CM | POA: Diagnosis not present

## 2021-11-12 DIAGNOSIS — I10 Essential (primary) hypertension: Secondary | ICD-10-CM

## 2021-11-12 DIAGNOSIS — R002 Palpitations: Secondary | ICD-10-CM

## 2021-11-12 DIAGNOSIS — E785 Hyperlipidemia, unspecified: Secondary | ICD-10-CM

## 2021-11-12 NOTE — Patient Instructions (Addendum)
Medication Instructions:  Your physician recommends that you continue on your current medications as directed. Please refer to the Current Medication list given to you today. *If you need a refill on your cardiac medications before your next appointment, please call your pharmacy*   Lab Work: [facility] If you have labs (blood work) drawn today and your tests are completely normal, you will receive your results only by: [place] (if you have MyChart) OR A paper copy in the mail If you have any lab test that is abnormal or we need to change your treatment, we will call you to review the results.   Testing/Procedures: Your physician has requested that you have an echocardiogram. Echocardiography is a painless test that uses sound waves to create images of your heart. It provides your doctor with information about the size and shape of your heart and how well your heart's chambers and valves are working. This procedure takes approximately one hour. There are no restrictions for this procedure.    Follow-Up: At [facility], you and your health needs are our priority.  As part of our continuing mission to provide you with exceptional heart care, we have created designated Provider Care Teams.  These Care Teams include your primary Cardiologist (physician) and Advanced Practice Providers (APPs -  Physician Assistants and Nurse Practitioners) who all work together to provide you with the care you need, when you need it.  We recommend signing up for the patient portal called "MyChart".  Sign up information is provided on this After Visit Summary.  MyChart is used to connect with patients for Virtual Visits (Telemedicine).  Patients are able to view lab/test results, encounter notes, upcoming appointments, etc.  Non-urgent messages can be sent to your provider as well.   To learn more about what you can do with MyChart, go to [email or web address].    Your next appointment:   2  month(s)  The format for your next appointment:   In Person  Provider:   Ambrose Pancoast, NP     Other Instructions   Important Information About Sugar

## 2021-11-13 LAB — MAGNESIUM: Magnesium: 2 mg/dL (ref 1.6–2.3)

## 2021-11-23 ENCOUNTER — Other Ambulatory Visit: Payer: Self-pay | Admitting: Internal Medicine

## 2021-12-04 ENCOUNTER — Other Ambulatory Visit: Payer: Self-pay | Admitting: Internal Medicine

## 2021-12-10 ENCOUNTER — Ambulatory Visit (HOSPITAL_COMMUNITY): Payer: 59 | Attending: Cardiovascular Disease

## 2021-12-10 DIAGNOSIS — I5020 Unspecified systolic (congestive) heart failure: Secondary | ICD-10-CM | POA: Diagnosis present

## 2021-12-10 DIAGNOSIS — E785 Hyperlipidemia, unspecified: Secondary | ICD-10-CM | POA: Insufficient documentation

## 2021-12-10 DIAGNOSIS — I10 Essential (primary) hypertension: Secondary | ICD-10-CM | POA: Insufficient documentation

## 2021-12-10 DIAGNOSIS — E039 Hypothyroidism, unspecified: Secondary | ICD-10-CM | POA: Insufficient documentation

## 2021-12-10 DIAGNOSIS — F1910 Other psychoactive substance abuse, uncomplicated: Secondary | ICD-10-CM | POA: Insufficient documentation

## 2021-12-10 DIAGNOSIS — G4733 Obstructive sleep apnea (adult) (pediatric): Secondary | ICD-10-CM | POA: Diagnosis present

## 2021-12-10 LAB — ECHOCARDIOGRAM COMPLETE
Area-P 1/2: 2.07 cm2
MV M vel: 5.19 m/s
MV Peak grad: 107.7 mmHg
Radius: 0.4 cm
S' Lateral: 4.1 cm

## 2022-01-07 ENCOUNTER — Encounter: Payer: Self-pay | Admitting: Gastroenterology

## 2022-01-27 ENCOUNTER — Other Ambulatory Visit: Payer: Self-pay | Admitting: Nurse Practitioner

## 2022-04-08 NOTE — Progress Notes (Signed)
Office Visit    Patient Name: Jamie Hicks Date of Encounter: 04/09/2022  Primary Care Provider:  Nolene Ebbs, MD Primary Cardiologist:  Pixie Casino, MD Primary Electrophysiologist: None  Chief Complaint    Jamie Hicks is a 66 y.o. female with PMH of  HTN, HFrEF, polysubstance abuse (cocaine and alcohol), medical noncompliance, obstructive sleep apnea on CPAP, and thyroid disease presents today for week follow up for overdue follow-up of CHF.  Past Medical History    Past Medical History:  Diagnosis Date   Allergy    Anemia    ARDS (adult respiratory distress syndrome) (HCC)    Arthritis    OA   Back pain    Blind right eye    GERD (gastroesophageal reflux disease)    Hypertension    Thyroid disease    Past Surgical History:  Procedure Laterality Date   HERNIA REPAIR     knee arthorscopy      Allergies  Allergies  Allergen Reactions   Amoxicillin Swelling    Has patient had a PCN reaction causing immediate rash, facial/tongue/throat swelling, SOB or lightheadedness with hypotension: yes Has patient had a PCN reaction causing severe rash involving mucus membranes or skin necrosis: no Has patient had a PCN reaction that required hospitalization: yes  Has patient had a PCN reaction occurring within the last 10 years: yes, 08/29/16 If all of the above answers are "NO", then may proceed with Cephalosporin use.    Lisinopril Cough   Latex Rash    History of Present Illness    Jamie Hicks is a 66 year old with the above mentioned past medical history. She presented to [facility] ED on 04/2017 with worsening dyspnea and productive cough.  She was found to have a positive PCR for influenza A and subsequent D-dimer and CT revealed no evidence of PE. She was started on azithromycin and Rocephin along with Tamiflu but developed increased confusion and worsening dyspnea that required intubation.  She was extubated on 11/9 and diuresed off 5.3 L with  Lasix. Echo completed with EF of 30-40%, diffuse HK, mild MR and trivial pericardial effusion.  She was discharged on 11/12 and placed on Coreg and low-dose ARB.  She was seen in follow-up by Dr. Debara Pickett on 12/18 and ARB was increased and patient had slight elevation of blood pressure. Echo was completed with EF improved to 50-60%, and patient maintained sobriety over the year.  She was referred to Dr. Claiborne Billings for sleep study evaluation and was found to have obstructive sleep apnea and started on CPAP therapy. Patient's blood pressure normalized with adjustments to medication regimen and CPAP combination. Patient was last seen by Dr. Debara Pickett on 08/2020.  At that time she reported noncompliance with her CPAP and patient was overdue for lab work.  Her blood pressure was well controlled   She was seen in office on 4/20 and her blood pressure was 148/86, on recheck it was 132/78. She was euvolemic on examination. She reported that she was drinking liquor and smoking marijuana currently due to multiple life stressors. She was bradycardiac with HR of 45 with PVCs in the office and was placed on 7 day Zio monitor for dizziness. Amlodipine 2.5 mg was added due to elevated B/P. Her TSH was found to be elevated, possibly contributing to the issue.  She was last seen in follow-up on 11/12/2021 and reported that she was doing well.  She had recently moved and never received her ZIO  monitor.  EKG was much improved with no PVCs and heart rate of 59.  No medication changes were made during visit and patient was in the process of purchasing a new mask for her CPAP.  Jamie Hicks presents today for 5-monthfollow-up alone.  Since last being seen in the office patient reports she has been doing well and has lost 9 pounds since she was seen last.  She is currently watching her diet and is walking for exercise.  She is currently compliant with her medications and denies any adverse reactions.  She reports occasional palpitations that is  not associated with any fatigue or dizziness.  She is euvolemic on examination and is currently only taking Lasix as needed.  Her blood pressure today is well controlled at 118/70 and heart rate is 97 bpm.  Patient denies chest pain, palpitations, dyspnea, PND, orthopnea, nausea, vomiting, dizziness, syncope, edema, weight gain, or early satiety.  Home Medications    Current Outpatient Medications  Medication Sig Dispense Refill   acetaminophen (TYLENOL) 650 MG CR tablet Take 650 mg by mouth every 8 (eight) hours as needed for pain.     amLODipine (NORVASC) 2.5 MG tablet TAKE 1 TABLET BY MOUTH EVERY DAY 90 tablet 3   ascorbic acid (VITAMIN C) 100 MG tablet Take 100 mg by mouth daily.     aspirin EC 81 MG tablet Take 81 mg by mouth daily.     atorvastatin (LIPITOR) 20 MG tablet TAKE 1 TABLET BY MOUTH EVERYDAY AT BEDTIME 90 tablet 3   cetirizine (ZYRTEC) 10 MG tablet TAKE 1 TABLET BY MOUTH EVERY DAY AS NEEDED FOR ALLERGY     cycloSPORINE (RESTASIS) 0.05 % ophthalmic emulsion Place 1 drop into both eyes 2 times daily.     famotidine (PEPCID) 20 MG tablet TAKE 1 TABLET BY MOUTH TWICE A DAY 1376tablet 1   folic acid (FOLVITE) 1 MG tablet Take 1 mg by mouth daily.     furosemide (LASIX) 20 MG tablet TAKE 1 TABLET BY MOUTH EVERY DAY 90 tablet 3   gabapentin (NEURONTIN) 300 MG capsule Take 300 mg by mouth 3 (three) times daily.     hydroxypropyl methylcellulose / hypromellose (ISOPTO TEARS / GONIOVISC) 2.5 % ophthalmic solution Place 1 drop into both eyes 2 (two) times daily.     ibuprofen (ADVIL) 800 MG tablet TAKE 1 TABLET BY MOUTH EVERY 8 HOURS AS NEEDED 90 tablet 3   ipratropium (ATROVENT) 0.06 % nasal spray Place 2 sprays into both nostrils 4 (four) times daily. For allergies 30 mL 0   levothyroxine (SYNTHROID) 150 MCG tablet Take 150 mcg by mouth every morning.     losartan (COZAAR) 100 MG tablet TAKE 1 TABLET BY MOUTH EVERY DAY 90 tablet 1   Vitamin D, Ergocalciferol, (DRISDOL) 1.25 MG (50000  UT) CAPS capsule Take 50,000 Units by mouth once a week.     Current Facility-Administered Medications  Medication Dose Route Frequency Provider Last Rate Last Admin   0.9 %  sodium chloride infusion  500 mL Intravenous Once Nandigam, KVenia Minks MD         Review of Systems  Please see the history of present illness.    (+) Dizziness Complaint All other systems reviewed and are otherwise negative except as noted above.  Physical Exam    Wt Readings from Last 3 Encounters:  04/09/22 221 lb (100.2 kg)  11/12/21 232 lb (105.2 kg)  10/01/21 238 lb (108 kg)   VS: Vitals:  04/09/22 1116  BP: 118/70  Pulse: 97  SpO2: 99%  ,Body mass index is 33.6 kg/m.  Constitutional:      Appearance: Healthy appearance. Not in distress.  Neck:     Vascular: JVD normal.  Pulmonary:     Effort: Pulmonary effort is normal.     Breath sounds: No wheezing. No rales. Diminished in the bases Cardiovascular:     Normal rate. Regular rhythm. Normal S1. Normal S2.      Murmurs: There is no murmur.  Edema:    Peripheral edema absent.  Abdominal:     Palpations: Abdomen is soft non tender. There is no hepatomegaly.  Skin:    General: Skin is warm and dry.  Neurological:     General: No focal deficit present.     Mental Status: Alert and oriented to person, place and time.     Cranial Nerves: Cranial nerves are intact.  EKG/LABS/Other Studies Reviewed    ECG personally reviewed by me today -none completed today  Lab Results  Component Value Date   WBC 7.1 10/01/2021   HGB 13.0 10/01/2021   HCT 39.6 10/01/2021   MCV 84 10/01/2021   PLT 214 10/01/2021   Lab Results  Component Value Date   CREATININE 1.11 (H) 10/01/2021   BUN 14 10/01/2021   NA 144 10/01/2021   K 4.1 10/01/2021   CL 105 10/01/2021   CO2 21 10/01/2021   Lab Results  Component Value Date   ALT 19 09/09/2020   AST 18 09/09/2020   ALKPHOS 96 09/09/2020   BILITOT 0.3 09/09/2020   Lab Results  Component Value Date    CHOL 132 09/09/2020   HDL 37 (L) 09/09/2020   LDLCALC 72 09/09/2020   TRIG 126 09/09/2020   CHOLHDL 3.6 09/09/2020    Lab Results  Component Value Date   HGBA1C 6.2 (H) 09/09/2020    Assessment & Plan      1.HFrEF with recovered LV function: -Last echo completed 2019 with EF normalized at 55-60%, we will plan for repeat echo to reevaluate her current EF and heart function since she had begun using substances again recently -Patient is euvolemic on examination today and states that she is abstaining from excess sodium in her diet. -Continue losartan 100 mg daily and change Lasix 20 mg to as needed.  -Low sodium diet, fluid restriction <2L, and daily weights encouraged. Educated to contact our office for weight gain of 2 lbs overnight or 5 lbs in one week.   -BMET today  2.HTN: -Blood pressure today was 118/70 well-controlled and at goal. -Continue losartan 100 mg daily and amlodipine 2.5 mg daily   3. Palpitations with sinus bradycardia and PVCs last visit: -Patient unfortunately did not receive her a ZIO monitor due to incorrect mailing address provided. -We will reorder ZIO monitor due to ongoing palpitations and to quantify for possible arrhythmia   4.HLD: -Most recent LDL was 72 almost at goal of less than 70 -Continue Lipitor 20 mg daily -We will obtain updated LFTs and lipids today   5.OSA: -Patient is currently without CPAP mask and would like to purchase 1 to resume usage. -I will reach out to Jaclyn Prime Gershon Cull) Ronnald Ramp to request information regarding CPAP mask replacement   6.  Polysubstance abuse: -Patient states that she is still indulging in wine and the marijuana occasionally. -She was counseled on the importance of abstaining from marijuana and alcohol in the setting of heart failure and thyroid disease.  7. Hypothyroidism: -Currently followed by PCP and is now compliant with Synthroid  Disposition: Follow-up with Pixie Casino, MD or APP in 12  months    Medication Adjustments/Labs and Tests Ordered: Current medicines are reviewed at length with the patient today.  Concerns regarding medicines are outlined above.   Signed, Mable Fill, Marissa Nestle, NP 04/09/2022, 11:38 AM [facility]  Note:  This document was prepared using Dragon voice recognition software and may include unintentional dictation errors.

## 2022-04-09 ENCOUNTER — Ambulatory Visit: Payer: 59 | Attending: Nurse Practitioner | Admitting: Nurse Practitioner

## 2022-04-09 ENCOUNTER — Ambulatory Visit: Payer: 59

## 2022-04-09 ENCOUNTER — Encounter: Payer: Self-pay | Admitting: Nurse Practitioner

## 2022-04-09 VITALS — BP 118/70 | HR 97 | Ht 68.0 in | Wt 221.0 lb

## 2022-04-09 DIAGNOSIS — G4733 Obstructive sleep apnea (adult) (pediatric): Secondary | ICD-10-CM

## 2022-04-09 DIAGNOSIS — R002 Palpitations: Secondary | ICD-10-CM

## 2022-04-09 DIAGNOSIS — E039 Hypothyroidism, unspecified: Secondary | ICD-10-CM

## 2022-04-09 DIAGNOSIS — I10 Essential (primary) hypertension: Secondary | ICD-10-CM

## 2022-04-09 DIAGNOSIS — I5033 Acute on chronic diastolic (congestive) heart failure: Secondary | ICD-10-CM

## 2022-04-09 DIAGNOSIS — F1910 Other psychoactive substance abuse, uncomplicated: Secondary | ICD-10-CM | POA: Diagnosis not present

## 2022-04-09 DIAGNOSIS — E785 Hyperlipidemia, unspecified: Secondary | ICD-10-CM

## 2022-04-09 LAB — HEPATIC FUNCTION PANEL
ALT: 17 [IU]/L (ref 0–32)
AST: 18 [IU]/L (ref 0–40)
Albumin: 4.3 g/dL (ref 3.9–4.9)
Alkaline Phosphatase: 96 [IU]/L (ref 44–121)
Bilirubin Total: 0.6 mg/dL (ref 0.0–1.2)
Bilirubin, Direct: 0.15 mg/dL (ref 0.00–0.40)
Total Protein: 7.6 g/dL (ref 6.0–8.5)

## 2022-04-09 LAB — BASIC METABOLIC PANEL
BUN/Creatinine Ratio: 16 (ref 12–28)
BUN: 15 mg/dL (ref 8–27)
CO2: 24 mmol/L (ref 20–29)
Calcium: 10.4 mg/dL — ABNORMAL HIGH (ref 8.7–10.3)
Chloride: 103 mmol/L (ref 96–106)
Creatinine, Ser: 0.95 mg/dL (ref 0.57–1.00)
Glucose: 92 mg/dL (ref 70–99)
Potassium: 4 mmol/L (ref 3.5–5.2)
Sodium: 139 mmol/L (ref 134–144)
eGFR: 66 mL/min/{1.73_m2} (ref >59)

## 2022-04-09 LAB — LIPID PANEL
Chol/HDL Ratio: 3.3 ratio (ref 0.0–4.4)
Cholesterol, Total: 153 mg/dL (ref 100–199)
HDL: 47 mg/dL (ref >39)
LDL Chol Calc (NIH): 88 mg/dL (ref 0–99)
Triglycerides: 94 mg/dL (ref 0–149)
VLDL Cholesterol Cal: 18 mg/dL (ref 5–40)

## 2022-04-09 NOTE — Progress Notes (Unsigned)
Enrolled patient for a 14 day Zio XT monitor to be mailed to patients home  Dr Debara Pickett to read

## 2022-04-09 NOTE — Patient Instructions (Signed)
Medication Instructions:  Your physician has recommended you make the following change in your medication:   Change lasix to as needed for weight gain of 2lbs in 24 hours or 5 lbs in 7 days.  *If you need a refill on your cardiac medications before your next appointment, please call your pharmacy*   Lab Work: BMET, Lipids, LFT If you have labs (blood work) drawn today and your tests are completely normal, you will receive your results only by: [place] (if you have MyChart) OR A paper copy in the mail If you have any lab test that is abnormal or we need to change your treatment, we will call you to review the results.   Testing/Procedures: Your physician has recommended that you wear an event monitor. Event monitors are medical devices that record the heart's electrical activity. Doctors most often [place] these monitors to diagnose arrhythmias. Arrhythmias are problems with the speed or rhythm of the heartbeat. The monitor is a small, portable device. You can wear one while you do your normal daily activities. This is usually used to diagnose what is causing palpitations/syncope (passing out).    Follow-Up: At [facility], you and your health needs are our priority.  As part of our continuing mission to provide you with exceptional heart care, we have created designated Provider Care Teams.  These Care Teams include your primary Cardiologist (physician) and Advanced Practice Providers (APPs -  Physician Assistants and Nurse Practitioners) who all work together to provide you with the care you need, when you need it.  We recommend signing up for the patient portal called "MyChart".  Sign up information is provided on this After Visit Summary.  MyChart is used to connect with patients for Virtual Visits (Telemedicine).  Patients are able to view lab/test results, encounter notes, upcoming appointments, etc.  Non-urgent messages can be sent to your provider as well.   To learn more  about what you can do with MyChart, go to [email or web address].    Your next appointment:   1 year(s)  The format for your next appointment:   In Person  Provider:   Ambrose Pancoast, NP        Other Instructions [place] Monitor Instructions  Your physician has requested you wear a ZIO patch monitor for 14 days.  This is a single patch monitor. [employer] supplies one patch monitor per enrollment. Additional stickers are not available. Please do not apply patch if you will be having a Nuclear Stress Test,  Echocardiogram, Cardiac CT, MRI, or Chest Xray during the period you would be wearing the  monitor. The patch cannot be worn during these tests. You cannot remove and re-apply the  ZIO XT patch monitor.  Your ZIO patch monitor will be mailed 3 day USPS to your address on file. It may take 3-5 days  to receive your monitor after you have been enrolled.  Once you have received your monitor, please review the enclosed instructions. Your monitor  has already been registered assigning a specific monitor serial # to you.  Billing and Patient Assistance Program Information  We have supplied [employer] with any of your insurance information on file for billing purposes. [employer] offers a sliding scale Patient Assistance Program for patients that do not have  insurance, or whose insurance does not completely cover the cost of the ZIO monitor.  You must apply for the Patient Assistance Program to qualify for this discounted rate.  To apply, please call [employer] at  [phone], select option 4, select option 2, ask to apply for  Patient Assistance Program. Theodore Demark will ask your household income, and how many people  are in your household. They will quote your out-of-pocket cost based on that information.  [employer] will also be able to set up a 52-month interest-free payment plan if needed.  Applying the monitor   Shave hair from upper left chest.  Hold abrader disc by orange tab. Rub  abrader in 40 strokes over the upper left chest as  indicated in your monitor instructions.  Clean area with 4 enclosed alcohol pads. Let dry.  Apply patch as indicated in monitor instructions. Patch will be placed under collarbone on left  side of chest with arrow pointing upward.  Rub patch adhesive wings for 2 minutes. Remove white label marked "1". Remove the white  label marked "2". Rub patch adhesive wings for 2 additional minutes.  While looking in a mirror, press and release button in center of patch. A small green light will  flash 3-4 times. This will be your only indicator that the monitor has been turned on.  Do not shower for the first 24 hours. You may shower after the first 24 hours.  Press the button if you feel a symptom. You will hear a small click. Record Date, Time and  Symptom in the Patient Logbook.  When you are ready to remove the patch, follow instructions on the last 2 pages of Patient  Logbook. Stick patch monitor onto the last page of Patient Logbook.  Place Patient Logbook in the blue and white box. Use locking tab on box and tape box closed  securely. The blue and white box has prepaid postage on it. Please place it in the mailbox as  soon as possible. Your physician should have your test results approximately 7 days after the  [facility]  Call [place]at [phone] you have questions regarding  your ZIO XT patch monitor. Call them immediately if you see an orange light blinking on your  monitor.  If your monitor falls off in less than 4 days, contact our Monitor department at [phone]  If your monitor becomes loose or falls off after 4 days call [employer] at [phone]  suggestions on securing your monitor

## 2022-04-14 ENCOUNTER — Telehealth: Payer: Self-pay

## 2022-04-14 NOTE — Telephone Encounter (Signed)
Unable to leave a message.

## 2022-04-27 ENCOUNTER — Telehealth: Payer: Self-pay | Admitting: Nurse Practitioner

## 2022-04-27 NOTE — Telephone Encounter (Signed)
Patient is calling for her lab results.  

## 2022-04-29 NOTE — Telephone Encounter (Signed)
Unable to reach the patient. Called the patient but no answer and unable to leave a message.  Called the patients daughter and she gave me the patients new phone number (number in chart) but no answer.

## 2022-05-03 NOTE — Telephone Encounter (Signed)
Unable to reach the patient

## 2022-05-03 NOTE — Telephone Encounter (Signed)
Pt calling back for lab results

## 2022-05-03 NOTE — Telephone Encounter (Addendum)
Received a call from the patient but I was discharging a patient and missed the call.

## 2022-05-03 NOTE — Telephone Encounter (Addendum)
    Jamie Lund., NP 04/10/2022  8:50 AM EDT     Please let Jamie Hicks know her renal function and electrolytes are within normal limits.  Cholesterol remains controlled however LDL has increased and is now above goal of less than 70.  Her liver function is also normal.  Please advise patient to increase intake of foods rich in omega 3 fatty acids such as cold water fish, nuts, plant oils such as olive oil.  Please advise her to increase physical activity to at least 150 minutes/week of aerobic exercise and continue current medications as prescribed.   Ambrose Pancoast, NP    Patient notified directly and voiced understanding.

## 2022-08-18 ENCOUNTER — Ambulatory Visit (INDEPENDENT_AMBULATORY_CARE_PROVIDER_SITE_OTHER): Payer: 59 | Admitting: Podiatry

## 2022-08-18 ENCOUNTER — Encounter: Payer: Self-pay | Admitting: Podiatry

## 2022-08-18 DIAGNOSIS — M79674 Pain in right toe(s): Secondary | ICD-10-CM

## 2022-08-18 DIAGNOSIS — M79675 Pain in left toe(s): Secondary | ICD-10-CM

## 2022-08-18 DIAGNOSIS — B351 Tinea unguium: Secondary | ICD-10-CM | POA: Diagnosis not present

## 2022-08-18 NOTE — Progress Notes (Signed)

## 2022-09-01 ENCOUNTER — Ambulatory Visit: Payer: 59 | Admitting: Orthopaedic Surgery

## 2022-09-16 ENCOUNTER — Other Ambulatory Visit: Payer: Self-pay | Admitting: Internal Medicine

## 2022-09-18 LAB — COMPLETE METABOLIC PANEL WITH GFR
AG Ratio: 1.6 (calc) (ref 1.0–2.5)
ALT: 14 U/L (ref 6–29)
AST: 12 U/L (ref 10–35)
Albumin: 4.5 g/dL (ref 3.6–5.1)
Alkaline phosphatase (APISO): 86 U/L (ref 37–153)
BUN: 19 mg/dL (ref 7–25)
CO2: 23 mmol/L (ref 20–32)
Calcium: 10.5 mg/dL — ABNORMAL HIGH (ref 8.6–10.4)
Chloride: 107 mmol/L (ref 98–110)
Creat: 0.81 mg/dL (ref 0.50–1.05)
Globulin: 2.9 g/dL (calc) (ref 1.9–3.7)
Glucose, Bld: 86 mg/dL (ref 65–99)
Potassium: 4.4 mmol/L (ref 3.5–5.3)
Sodium: 140 mmol/L (ref 135–146)
Total Bilirubin: 0.3 mg/dL (ref 0.2–1.2)
Total Protein: 7.4 g/dL (ref 6.1–8.1)
eGFR: 81 mL/min/{1.73_m2} (ref >=60)

## 2022-09-18 LAB — LIPID PANEL
Cholesterol: 143 mg/dL (ref <200)
HDL: 44 mg/dL — ABNORMAL LOW (ref >=50)
LDL Cholesterol (Calc): 82 mg/dL (calc)
Non-HDL Cholesterol (Calc): 99 mg/dL (calc) (ref <130)
Total CHOL/HDL Ratio: 3.3 (calc) (ref <5.0)
Triglycerides: 86 mg/dL (ref <150)

## 2022-09-18 LAB — URINE CULTURE
MICRO NUMBER:: 14783148
SPECIMEN QUALITY:: ADEQUATE

## 2022-09-18 LAB — CBC
HCT: 38.5 % (ref 35.0–45.0)
Hemoglobin: 12.5 g/dL (ref 11.7–15.5)
MCH: 27.1 pg (ref 27.0–33.0)
MCHC: 32.5 g/dL (ref 32.0–36.0)
MCV: 83.5 fL (ref 80.0–100.0)
MPV: 12.2 fL (ref 7.5–12.5)
Platelets: 201 10*3/uL (ref 140–400)
RBC: 4.61 10*6/uL (ref 3.80–5.10)
RDW: 12.6 % (ref 11.0–15.0)
WBC: 6.1 10*3/uL (ref 3.8–10.8)

## 2022-09-18 LAB — TSH: TSH: 1.28 mIU/L (ref 0.40–4.50)

## 2022-09-18 LAB — T4, FREE: Free T4: 1.4 ng/dL (ref 0.8–1.8)

## 2022-09-18 LAB — VITAMIN D 25 HYDROXY (VIT D DEFICIENCY, FRACTURES): Vit D, 25-Hydroxy: 55 ng/mL (ref 30–100)

## 2022-09-22 ENCOUNTER — Encounter: Payer: Self-pay | Admitting: Orthopaedic Surgery

## 2022-09-22 ENCOUNTER — Ambulatory Visit (INDEPENDENT_AMBULATORY_CARE_PROVIDER_SITE_OTHER): Payer: 59 | Admitting: Orthopaedic Surgery

## 2022-09-22 DIAGNOSIS — M25562 Pain in left knee: Secondary | ICD-10-CM

## 2022-09-22 DIAGNOSIS — M25561 Pain in right knee: Secondary | ICD-10-CM

## 2022-09-22 DIAGNOSIS — G8929 Other chronic pain: Secondary | ICD-10-CM

## 2022-09-22 MED ORDER — METHYLPREDNISOLONE ACETATE 40 MG/ML IJ SUSP
40.0000 mg | INTRAMUSCULAR | Status: AC | PRN
  Administered 2022-09-22: 40 mg via INTRA_ARTICULAR

## 2022-09-22 MED ORDER — LIDOCAINE HCL 1 % IJ SOLN
3.0000 mL | INTRAMUSCULAR | Status: AC | PRN
  Administered 2022-09-22: 3 mL

## 2022-09-22 NOTE — Progress Notes (Signed)
The patient comes in today with bilateral knee pain that is chronic.  We have seen her for her knees before.  She is 67 years old.  We last saw her 2 years ago and placed steroid injections in both knees which have helped.  She would like to have this again.  She denies being a diabetic and denies any acute changes in her medical status but she does report that she is having to take care of herself in terms of getting back to normal things like colonoscopies and mammograms for screening.  She does report bilateral knee pain and would like to have a steroid injection in both knees today.  She is not interested in any type of surgical intervention.  Again 2 years ago we did document arthritis in both knees.  Examination both knees today show no significant effusion.  She has slight varus malalignment of both knees with the patellofemoral crepitation throughout the arc of motion but good motion overall.  Her request I did place a steroid injection in both knees today which she tolerated well.  All questions and concerns were answered and addressed.  Follow-up is as needed.     Procedure Note  Patient: Jamie Hicks             Date of Birth: 06-Aug-1955           MRN: [id number]             Visit Date: 09/22/2022  Procedures: Visit Diagnoses:  1. Chronic pain of left knee   2. Chronic pain of right knee     Large Joint Inj: R knee on 09/22/2022 11:27 AM Indications: diagnostic evaluation and pain Details: 22 G 1.5 in needle, superolateral approach  Arthrogram: No  Medications: 3 mL lidocaine 1 %; 40 mg methylPREDNISolone acetate 40 MG/ML Outcome: tolerated well, no immediate complications Procedure, treatment alternatives, risks and benefits explained, specific risks discussed. Consent was given by the patient. Immediately prior to procedure a time out was called to verify the correct patient, procedure, equipment, support staff and site/side marked as required. Patient was prepped and  draped in the usual sterile fashion.    Large Joint Inj: L knee on 09/22/2022 11:27 AM Indications: diagnostic evaluation and pain Details: 22 G 1.5 in needle, superolateral approach  Arthrogram: No  Medications: 3 mL lidocaine 1 %; 40 mg methylPREDNISolone acetate 40 MG/ML Outcome: tolerated well, no immediate complications Procedure, treatment alternatives, risks and benefits explained, specific risks discussed. Consent was given by the patient. Immediately prior to procedure a time out was called to verify the correct patient, procedure, equipment, support staff and site/side marked as required. Patient was prepped and draped in the usual sterile fashion.

## 2022-11-15 ENCOUNTER — Ambulatory Visit (INDEPENDENT_AMBULATORY_CARE_PROVIDER_SITE_OTHER): Payer: 59 | Admitting: Podiatry

## 2022-11-15 ENCOUNTER — Encounter: Payer: Self-pay | Admitting: Podiatry

## 2022-11-15 DIAGNOSIS — B351 Tinea unguium: Secondary | ICD-10-CM

## 2022-11-15 DIAGNOSIS — M79674 Pain in right toe(s): Secondary | ICD-10-CM

## 2022-11-15 DIAGNOSIS — M79675 Pain in left toe(s): Secondary | ICD-10-CM | POA: Diagnosis not present

## 2022-11-15 NOTE — Progress Notes (Signed)

## 2022-11-17 ENCOUNTER — Ambulatory Visit: Payer: 59 | Admitting: Podiatry

## 2022-11-18 ENCOUNTER — Ambulatory Visit: Payer: 59 | Admitting: Podiatry

## 2022-12-01 ENCOUNTER — Encounter: Payer: Self-pay | Admitting: Gastroenterology

## 2022-12-24 ENCOUNTER — Other Ambulatory Visit (INDEPENDENT_AMBULATORY_CARE_PROVIDER_SITE_OTHER): Payer: Self-pay | Admitting: Orthopaedic Surgery

## 2022-12-24 ENCOUNTER — Other Ambulatory Visit: Payer: Self-pay | Admitting: Internal Medicine

## 2023-01-27 ENCOUNTER — Ambulatory Visit (INDEPENDENT_AMBULATORY_CARE_PROVIDER_SITE_OTHER): Payer: 59 | Admitting: Gastroenterology

## 2023-01-27 ENCOUNTER — Encounter: Payer: Self-pay | Admitting: Gastroenterology

## 2023-01-27 VITALS — BP 124/80 | HR 56 | Ht 68.0 in | Wt 222.0 lb

## 2023-01-27 DIAGNOSIS — Z860100 Personal history of colon polyps, unspecified: Secondary | ICD-10-CM

## 2023-01-27 DIAGNOSIS — K5909 Other constipation: Secondary | ICD-10-CM

## 2023-01-27 DIAGNOSIS — R140 Abdominal distension (gaseous): Secondary | ICD-10-CM | POA: Diagnosis not present

## 2023-01-27 NOTE — Patient Instructions (Addendum)
Start taking Miralax 1 capful (17 grams) 1x / day for 1 week.   If this is not effective, increase to 1 dose 2x / day for 1 week.   If this is still not effective, increase to two capfuls (34 grams) 2x / day.   Can adjust dose as needed based on response. Can take 1/2 cap daily, skip days, or increase per day.    Fiber daily  Squatty potty  Drink 60-100 oz water daily  Follow up per recommendations after colonoscopy  Plenvu  prep kit sample given due to patient having a 2 day prep  You will need a 2 day colonoscopy prep due to constipation and last colonoscopy was only an adequate prep  You have been scheduled for a colonoscopy. Please follow written instructions given to you at your visit today.   Please pick up your prep supplies at the pharmacy within the next 1-3 days.  If you use inhalers (even only as needed), please bring them with you on the day of your procedure.  DO NOT TAKE 7 DAYS PRIOR TO TEST- Trulicity (dulaglutide) Ozempic, Wegovy (semaglutide) Mounjaro (tirzepatide) Bydureon Bcise (exanatide extended release)  DO NOT TAKE 1 DAY PRIOR TO YOUR TEST Rybelsus (semaglutide) Adlyxin (lixisenatide) Victoza (liraglutide) Byetta (exanatide)  Due to recent changes in healthcare laws, you may see the results of your imaging and laboratory studies on MyChart before your provider has had a chance to review them.  We understand that in some cases there may be results that are confusing or concerning to you. Not all laboratory results come back in the same time frame and the provider may be waiting for multiple results in order to interpret others.  Please give [place] 48 hours in order for your provider to thoroughly review all the results before contacting the office for clarification of your results.    _______________________________________________________  If your blood pressure at your visit was 140/90 or greater, please contact your primary care physician to follow up on  this.  _______________________________________________________  If you are age 63 or older, your body mass index should be between 23-30. Your Body mass index is 33.75 kg/m. If this is out of the aforementioned range listed, please consider follow up with your Primary Care Provider.  If you are age 77 or younger, your body mass index should be between 19-25. Your Body mass index is 33.75 kg/m. If this is out of the aformentioned range listed, please consider follow up with your Primary Care Provider.   ________________________________________________________  The [facility] providers would like to encourage you to use [facility] to communicate with providers for non-urgent requests or questions.  Due to long hold times on the telephone, sending your provider a message by [facility] may be a faster and more efficient way to get a response.  Please allow 48 business hours for a response.  Please remember that this is for non-urgent requests.  _______________________________________________________   I appreciate the  opportunity to care for you  Thank You   [facility]  ___________________________________________________________________________

## 2023-01-27 NOTE — Progress Notes (Signed)
Chief Complaint: Constipation Primary GI MD: Dr. Lavon Paganini  HPI: 67 year old female with medical history as listed below presents for evaluation of constipation and bloating.  Patient reports longstanding history of constipation.  She is not on anything for this.  She also reports associated bloating when she has worsening constipation.  States she does not feel that she drinks enough water.  She also feels her dietary habits resulting constipation.  Denies abdominal pain.  Denies nausea/vomiting.  Denies melena/hematochezia.  Denies weight loss.  States she can go 2 days without a bowel movement.  Often has to strain with bowel movements and stool is hard.  She reports that she has a tall toilet.  PREVIOUS GI WORKUP   Last colonoscopy 11/2018 - One 12 mm tubular adenoma in the sigmoid colon, removed with a hot snare. Resected and retrieved.  - Moderate diverticulosis in the sigmoid colon, in the descending colon, in the transverse colon and in the ascending colon. There was evidence of an impacted diverticulum.  - Non- bleeding external and internal hemorrhoids. - repeat 3 years   Past Medical History:  Diagnosis Date   Allergy    Anemia    ARDS (adult respiratory distress syndrome) (HCC)    Arthritis    OA   Back pain    Blind right eye    GERD (gastroesophageal reflux disease)    Hypertension    Thyroid disease     Past Surgical History:  Procedure Laterality Date   HERNIA REPAIR     knee arthorscopy      Current Outpatient Medications  Medication Sig Dispense Refill   acetaminophen (TYLENOL) 650 MG CR tablet Take 650 mg by mouth every 8 (eight) hours as needed for pain.     amLODipine (NORVASC) 2.5 MG tablet TAKE 1 TABLET BY MOUTH EVERY DAY 90 tablet 3   ascorbic acid (VITAMIN C) 100 MG tablet Take 100 mg by mouth daily.     aspirin EC 81 MG tablet Take 81 mg by mouth daily.     atorvastatin (LIPITOR) 20 MG tablet TAKE 1 TABLET BY MOUTH EVERYDAY AT BEDTIME 90  tablet 3   cetirizine (ZYRTEC) 10 MG tablet TAKE 1 TABLET BY MOUTH EVERY DAY AS NEEDED FOR ALLERGY     cycloSPORINE (RESTASIS) 0.05 % ophthalmic emulsion Place 1 drop into both eyes 2 times daily.     famotidine (PEPCID) 20 MG tablet TAKE 1 TABLET BY MOUTH TWICE A DAY 180 tablet 1   folic acid (FOLVITE) 1 MG tablet Take 1 mg by mouth daily.     furosemide (LASIX) 20 MG tablet TAKE 1 TABLET BY MOUTH EVERY DAY 90 tablet 3   gabapentin (NEURONTIN) 300 MG capsule Take 300 mg by mouth 3 (three) times daily.     hydroxypropyl methylcellulose / hypromellose (ISOPTO TEARS / GONIOVISC) 2.5 % ophthalmic solution Place 1 drop into both eyes 2 (two) times daily.     ibuprofen (ADVIL) 800 MG tablet TAKE 1 TABLET BY MOUTH EVERY 8 HOURS AS NEEDED 90 tablet 3   ipratropium (ATROVENT) 0.06 % nasal spray Place 2 sprays into both nostrils 4 (four) times daily. For allergies 30 mL 0   levothyroxine (SYNTHROID) 150 MCG tablet Take 150 mcg by mouth every morning.     losartan (COZAAR) 100 MG tablet TAKE 1 TABLET BY MOUTH EVERY DAY 90 tablet 0   Vitamin D, Ergocalciferol, (DRISDOL) 1.25 MG (50000 UT) CAPS capsule Take 50,000 Units by mouth once a week.  No current facility-administered medications for this visit.    Allergies as of 01/27/2023 - Review Complete 01/27/2023  Allergen Reaction Noted   Amoxicillin Swelling 08/29/2016   Lisinopril Cough 04/25/2017   Latex Rash 08/13/2012    Family History  Problem Relation Age of Onset   Hypertension Mother        Living, currently 75 years old   Cancer Father        unknown   Colon cancer Neg Hx    Colon polyps Neg Hx    Esophageal cancer Neg Hx    Rectal cancer Neg Hx    Stomach cancer Neg Hx     Social History   Socioeconomic History   Marital status: Legally Separated    Spouse name: Not on file   Number of children: Not on file   Years of education: Not on file   Highest education level: Not on file  Occupational History   Not on file   Tobacco Use   Smoking status: Former    Current packs/day: 0.00    Average packs/day: 1 pack/day for 40.0 years (40.0 ttl pk-yrs)    Types: Cigarettes    Start date: 05/01/1977    Quit date: 05/01/2017    Years since quitting: 5.7   Smokeless tobacco: Never  Vaping Use   Vaping status: Never Used  Substance and Sexual Activity   Alcohol use: Not Currently    Comment: none in the last    Drug use: Not Currently    Types: "Crack" cocaine   Sexual activity: Not Currently  Other Topics Concern   Not on file  Social History Narrative   Not on file   Social Determinants of Health   Financial Resource Strain: Not on file  Food Insecurity: Not on file  Transportation Needs: Not on file  Physical Activity: Not on file  Stress: Not on file  Social Connections: Not on file  Intimate Partner Violence: Not on file    Review of Systems:    Constitutional: No weight loss, fever, chills, weakness or fatigue HEENT: Eyes: No change in vision               Ears, Nose, Throat:  No change in hearing or congestion Skin: No rash or itching Cardiovascular: No chest pain, chest pressure or palpitations   Respiratory: No SOB or cough Gastrointestinal: See HPI and otherwise negative Genitourinary: No dysuria or change in urinary frequency Neurological: No headache, dizziness or syncope Musculoskeletal: No new muscle or joint pain Hematologic: No bleeding or bruising Psychiatric: No history of depression or anxiety    Physical Exam:  Vital signs: Ht 5\' 8"  (1.727 m)   Wt 100.7 kg   LMP 07/21/2014 Comment: current  BMI 33.75 kg/m   Constitutional: NAD, Well developed, Well nourished, alert and cooperative Head:  Normocephalic and atraumatic. Eyes:   PEERL, EOMI. No icterus. Conjunctiva pink. Respiratory: Respirations even and unlabored. Lungs clear to auscultation bilaterally.   No wheezes, crackles, or rhonchi.  Cardiovascular:  Regular rate and rhythm. No peripheral edema, cyanosis  or pallor.  Gastrointestinal:  Soft, nondistended, nontender. No rebound or guarding. Normal bowel sounds. No appreciable masses or hepatomegaly. Rectal:  Not performed.  Msk:  Symmetrical without gross deformities. Without edema, no deformity or joint abnormality.  Neurologic:  Alert and  oriented x4;  grossly normal neurologically.  Skin:   Dry and intact without significant lesions or rashes. Psychiatric: Oriented to person, place and time. Demonstrates good judgement and  reason without abnormal affect or behaviors.   RELEVANT LABS AND IMAGING: CBC    Component Value Date/Time   WBC 6.1 09/16/2022 1100   RBC 4.61 09/16/2022 1100   HGB 12.5 09/16/2022 1100   HGB 13.0 10/01/2021 1155   HCT 38.5 09/16/2022 1100   HCT 39.6 10/01/2021 1155   PLT 201 09/16/2022 1100   PLT 214 10/01/2021 1155   MCV 83.5 09/16/2022 1100   MCV 84 10/01/2021 1155   MCH 27.1 09/16/2022 1100   MCHC 32.5 09/16/2022 1100   RDW 12.6 09/16/2022 1100   RDW 15.1 10/01/2021 1155   LYMPHSABS 0.4 (L) 04/19/2017 2033   MONOABS 0.3 04/19/2017 2033   EOSABS 0.0 04/19/2017 2033   BASOSABS 0.0 04/19/2017 2033    CMP     Component Value Date/Time   NA 140 09/16/2022 1100   NA 139 04/09/2022 1211   K 4.4 09/16/2022 1100   CL 107 09/16/2022 1100   CO2 23 09/16/2022 1100   GLUCOSE 86 09/16/2022 1100   BUN 19 09/16/2022 1100   BUN 15 04/09/2022 1211   CREATININE 0.81 09/16/2022 1100   CALCIUM 10.5 (H) 09/16/2022 1100   PROT 7.4 09/16/2022 1100   PROT 7.6 04/09/2022 1211   ALBUMIN 4.3 04/09/2022 1211   AST 12 09/16/2022 1100   ALT 14 09/16/2022 1100   ALKPHOS 96 04/09/2022 1211   BILITOT 0.3 09/16/2022 1100   BILITOT 0.6 04/09/2022 1211   GFRNONAA >60 05/17/2017 1225   GFRAA >60 05/17/2017 1225     Assessment/Plan:   Hx of colonic polyp Due for repeat colonoscopy.  Due to history of constipation will do 2-day prep. --- Schedule colonoscopy --- I thoroughly discussed the procedure with the patient  (at bedside) to include nature of the procedure, alternatives, benefits, and risks (including but not limited to bleeding, infection, perforation, anesthesia/cardiac pulmonary complications).  Patient verbalized understanding and gave verbal consent to proceed with procedure.  --- 2-day prep  Other constipation Abdominal bloating Constipation and abdominal bloating.  2 days without a bowel movement and typically stools are large.  No associated pain.  Not on any laxatives. --- Start taking Miralax 1 capful (17 grams) 1x / day for 1 week.   If this is not effective, increase to 1 dose 2x / day for 1 week.   If this is still not effective, increase to two capfuls (34 grams) 2x / day.   Can adjust dose as needed based on response. Can take 1/2 cap daily, skip days, or increase per day.   --- Start fiber supplement (Benefiber/Metamucil) --- Increase water to 60 to 100 ounces of water per day --- Squatty potty to help with bowel movements especially with history of tall toilet --- Follow-up per procedure/as needed  [facility] 01/27/2023, 10:26 AM  Cc: Fleet Contras, MD

## 2023-02-03 ENCOUNTER — Ambulatory Visit (AMBULATORY_SURGERY_CENTER): Payer: 59 | Admitting: Gastroenterology

## 2023-02-03 ENCOUNTER — Encounter: Payer: Self-pay | Admitting: Gastroenterology

## 2023-02-03 VITALS — BP 145/67 | HR 52 | Temp 98.0°F | Resp 15 | Ht 68.0 in | Wt 222.0 lb

## 2023-02-03 DIAGNOSIS — D124 Benign neoplasm of descending colon: Secondary | ICD-10-CM | POA: Diagnosis not present

## 2023-02-03 DIAGNOSIS — D125 Benign neoplasm of sigmoid colon: Secondary | ICD-10-CM

## 2023-02-03 DIAGNOSIS — Z86010 Personal history of colonic polyps: Secondary | ICD-10-CM | POA: Diagnosis not present

## 2023-02-03 DIAGNOSIS — D123 Benign neoplasm of transverse colon: Secondary | ICD-10-CM | POA: Diagnosis not present

## 2023-02-03 DIAGNOSIS — Z09 Encounter for follow-up examination after completed treatment for conditions other than malignant neoplasm: Secondary | ICD-10-CM

## 2023-02-03 MED ORDER — SODIUM CHLORIDE 0.9 % IV SOLN: 500.0000 mL | Freq: Once | INTRAVENOUS | Status: AC

## 2023-02-03 NOTE — Progress Notes (Signed)
[facility] History and Physical   Primary Care Physician:  Fleet Contras, MD   Reason for Procedure:  History of adenomatous colon polyps  Plan:    Surveillance colonoscopy with possible interventions as needed     HPI: Jamie Hicks is a very pleasant 67 y.o. female here for surveillance colonoscopy. Denies any nausea, vomiting, abdominal pain, melena or bright red blood per rectum  The risks and benefits as well as alternatives of endoscopic procedure(s) have been discussed and reviewed. All questions answered. The patient agrees to proceed.    Past Medical History:  Diagnosis Date   Allergy    Anemia    ARDS (adult respiratory distress syndrome) (HCC)    Arthritis    OA   Back pain    Blind right eye    GERD (gastroesophageal reflux disease)    Hypertension    Thyroid disease     Past Surgical History:  Procedure Laterality Date   HERNIA REPAIR     knee arthorscopy      Prior to Admission medications   Medication Sig Start Date End Date Taking? Authorizing Provider  acetaminophen (TYLENOL) 650 MG CR tablet Take 650 mg by mouth every 8 (eight) hours as needed for pain.   Yes [provider]  aspirin EC 81 MG tablet Take 81 mg by mouth daily.   Yes [provider]  cycloSPORINE (RESTASIS) 0.05 % ophthalmic emulsion Place 1 drop into both eyes 2 times daily. 07/11/20  Yes [provider]  ipratropium (ATROVENT) 0.06 % nasal spray Place 2 sprays into both nostrils 4 (four) times daily. For allergies 06/05/17  Yes Yu, Amy V, PA-C  levothyroxine (SYNTHROID) 150 MCG tablet Take 150 mcg by mouth every morning. 08/26/20  Yes [provider]  losartan (COZAAR) 100 MG tablet TAKE 1 TABLET BY MOUTH EVERY DAY 12/24/22  Yes Hilty, Lisette Abu, MD  Vitamin D, Ergocalciferol, (DRISDOL) 1.25 MG (50000 UT) CAPS capsule Take 50,000 Units by mouth once a week. 02/10/19  Yes [provider]  amLODipine (NORVASC) 2.5 MG tablet  TAKE 1 TABLET BY MOUTH EVERY DAY Patient not taking: Reported on 02/03/2023 01/27/22   Gaston Islam., NP  ascorbic acid (VITAMIN C) 100 MG tablet Take 100 mg by mouth daily. Patient not taking: Reported on 02/03/2023    [provider]  atorvastatin (LIPITOR) 20 MG tablet TAKE 1 TABLET BY MOUTH EVERYDAY AT BEDTIME Patient not taking: Reported on 02/03/2023 09/09/20   Chrystie Nose, MD  cetirizine (ZYRTEC) 10 MG tablet TAKE 1 TABLET BY MOUTH EVERY DAY AS NEEDED FOR ALLERGY Patient not taking: Reported on 02/03/2023 02/15/19   [provider]  famotidine (PEPCID) 20 MG tablet TAKE 1 TABLET BY MOUTH TWICE A DAY Patient not taking: Reported on 02/03/2023 08/30/19   Chrystie Nose, MD  folic acid (FOLVITE) 1 MG tablet Take 1 mg by mouth daily. Patient not taking: Reported on 02/03/2023 02/10/19   [provider]  furosemide (LASIX) 20 MG tablet TAKE 1 TABLET BY MOUTH EVERY DAY Patient not taking: Reported on 02/03/2023 11/24/21   Chrystie Nose, MD  gabapentin (NEURONTIN) 300 MG capsule Take 300 mg by mouth 3 (three) times daily. Patient not taking: Reported on 02/03/2023 06/29/21   [provider]  hydroxypropyl methylcellulose / hypromellose (ISOPTO TEARS / GONIOVISC) 2.5 % ophthalmic solution Place 1 drop into both eyes 2 (two) times daily. Patient not taking: Reported on 02/03/2023    [provider]  ibuprofen (ADVIL) 800 MG tablet TAKE 1 TABLET BY MOUTH EVERY 8 HOURS AS NEEDED 12/24/22   Kathryne Hitch, MD  loratadine (CLARITIN) 10 MG tablet Take 1 tablet (10 mg total) by mouth daily. (This medicine may be purchased from over there counter at yr local pharmacy): For allergies 06/05/17 10/29/19  Belinda Fisher, PA-C    Current Outpatient Medications  Medication Sig Dispense Refill   acetaminophen (TYLENOL) 650 MG CR tablet Take 650 mg by mouth every 8 (eight) hours as needed for pain.     aspirin EC 81 MG tablet Take 81 mg by mouth daily.      cycloSPORINE (RESTASIS) 0.05 % ophthalmic emulsion Place 1 drop into both eyes 2 times daily.     ipratropium (ATROVENT) 0.06 % nasal spray Place 2 sprays into both nostrils 4 (four) times daily. For allergies 30 mL 0   levothyroxine (SYNTHROID) 150 MCG tablet Take 150 mcg by mouth every morning.     losartan (COZAAR) 100 MG tablet TAKE 1 TABLET BY MOUTH EVERY DAY 90 tablet 0   Vitamin D, Ergocalciferol, (DRISDOL) 1.25 MG (50000 UT) CAPS capsule Take 50,000 Units by mouth once a week.     amLODipine (NORVASC) 2.5 MG tablet TAKE 1 TABLET BY MOUTH EVERY DAY (Patient not taking: Reported on 02/03/2023) 90 tablet 3   ascorbic acid (VITAMIN C) 100 MG tablet Take 100 mg by mouth daily. (Patient not taking: Reported on 02/03/2023)     atorvastatin (LIPITOR) 20 MG tablet TAKE 1 TABLET BY MOUTH EVERYDAY AT BEDTIME (Patient not taking: Reported on 02/03/2023) 90 tablet 3   cetirizine (ZYRTEC) 10 MG tablet TAKE 1 TABLET BY MOUTH EVERY DAY AS NEEDED FOR ALLERGY (Patient not taking: Reported on 02/03/2023)     famotidine (PEPCID) 20 MG tablet TAKE 1 TABLET BY MOUTH TWICE A DAY (Patient not taking: Reported on 02/03/2023) 180 tablet 1   folic acid (FOLVITE) 1 MG tablet Take 1 mg by mouth daily. (Patient not taking: Reported on 02/03/2023)     furosemide (LASIX) 20 MG tablet TAKE 1 TABLET BY MOUTH EVERY DAY (Patient not taking: Reported on 02/03/2023) 90 tablet 3   gabapentin (NEURONTIN) 300 MG capsule Take 300 mg by mouth 3 (three) times daily. (Patient not taking: Reported on 02/03/2023)     hydroxypropyl methylcellulose / hypromellose (ISOPTO TEARS / GONIOVISC) 2.5 % ophthalmic solution Place 1 drop into both eyes 2 (two) times daily. (Patient not taking: Reported on 02/03/2023)     ibuprofen (ADVIL) 800 MG tablet TAKE 1 TABLET BY MOUTH EVERY 8 HOURS AS NEEDED 90 tablet 3   Current Facility-Administered Medications  Medication Dose Route Frequency Provider Last Rate Last Admin   0.9 %  sodium chloride infusion  500 mL  Intravenous Once Napoleon Form, MD        Allergies as of 02/03/2023 - Review Complete 02/03/2023  Allergen Reaction Noted   Amoxicillin Swelling 08/29/2016   Lisinopril Cough 04/25/2017   Latex Rash 08/13/2012    Family History  Problem Relation Age of Onset   Hypertension Mother        Living, currently 104 years old   Cancer Father        unknown   Colon cancer Neg Hx    Colon polyps Neg Hx    Esophageal cancer Neg Hx    Rectal cancer Neg Hx    Stomach cancer Neg Hx     Social History   Socioeconomic History   Marital  status: Legally Separated    Spouse name: Not on file   Number of children: Not on file   Years of education: Not on file   Highest education level: Not on file  Occupational History   Not on file  Tobacco Use   Smoking status: Former    Current packs/day: 0.00    Average packs/day: 1 pack/day for 40.0 years (40.0 ttl pk-yrs)    Types: Cigarettes    Start date: 05/01/1977    Quit date: 05/01/2017    Years since quitting: 5.7   Smokeless tobacco: Never  Vaping Use   Vaping status: Never Used  Substance and Sexual Activity   Alcohol use: Not Currently    Comment: none in the last    Drug use: Not Currently    Types: "Crack" cocaine   Sexual activity: Not Currently  Other Topics Concern   Not on file  Social History Narrative   Not on file   Social Determinants of Health   Financial Resource Strain: Not on file  Food Insecurity: Not on file  Transportation Needs: Not on file  Physical Activity: Not on file  Stress: Not on file  Social Connections: Not on file  Intimate Partner Violence: Not on file    Review of Systems:  All other review of systems negative except as mentioned in the HPI.  Physical Exam: Vital signs in last 24 hours: BP (!) 178/90   Pulse (!) 45   Temp 98 F (36.7 C) (Skin)   Ht 5\' 8"  (1.727 m)   Wt 222 lb (100.7 kg)   LMP 07/21/2014 Comment: current  SpO2 100%   BMI 33.75 kg/m  General:   Alert,  NAD Lungs:  Clear .   Heart:  Regular rate and rhythm Abdomen:  Soft, nontender and nondistended. Neuro/Psych:  Alert and cooperative. Normal mood and affect. A and O x 3  Reviewed labs, radiology imaging, old records and pertinent past GI work up  Patient is appropriate for planned procedure(s) and anesthesia in an ambulatory setting   K. Scherry Ran , MD [phone]

## 2023-02-03 NOTE — Progress Notes (Signed)
Pt's states no medical or surgical changes since previsit or office visit. VS assessed by C.W 

## 2023-02-03 NOTE — Patient Instructions (Addendum)
YOU HAD AN ENDOSCOPIC PROCEDURE TODAY AT THE  ENDOSCOPY CENTER:   Refer to the procedure report that was given to you for any specific questions about what was found during the examination.  If the procedure report does not answer your questions, please call your gastroenterologist to clarify.  If you requested that your care partner not be given the details of your procedure findings, then the procedure report has been included in a sealed envelope for you to review at your convenience later.  YOU SHOULD EXPECT: Some feelings of bloating in the abdomen. Passage of more gas than usual.  Walking can help get rid of the air that was put into your GI tract during the procedure and reduce the bloating. If you had a lower endoscopy (such as a colonoscopy or flexible sigmoidoscopy) you may notice spotting of blood in your stool or on the toilet paper. If you underwent a bowel prep for your procedure, you may not have a normal bowel movement for a few days.  Please Note:  You might notice some irritation and congestion in your nose or some drainage.  This is from the oxygen used during your procedure.  There is no need for concern and it should clear up in a day or so.  SYMPTOMS TO REPORT IMMEDIATELY:  Following lower endoscopy (colonoscopy or flexible sigmoidoscopy):  Excessive amounts of blood in the stool  Significant tenderness or worsening of abdominal pains  Swelling of the abdomen that is new, acute  Fever of 100F or higher   For urgent or emergent issues, a gastroenterologist can be reached at any hour by calling [phone]. Do not use MyChart messaging for urgent concerns.    DIET:  We do recommend a small meal at first, but then you may proceed to your regular diet.  Drink plenty of fluids but you should avoid alcoholic beverages for 24 hours.  MEDICATIONS: Continue present medications.  Please see handouts given to you by your recovery nurse: Polyps, Diverticulosis,  Hemorrhoids.  FOLLOW UP: Await pathology results, Repeat colonoscopy in 3-5 years for surveillance.  Thank you for allowing [place] to provide for your healthcare needs today.  ACTIVITY:  You should plan to take it easy for the rest of today and you should NOT DRIVE or use heavy machinery until tomorrow (because of the sedation medicines used during the test).    FOLLOW UP: Our staff will call the number listed on your records the next business day following your procedure.  We will call around 7:15- 8:00 am to check on you and address any questions or concerns that you may have regarding the information given to you following your procedure. If we do not reach you, we will leave a message.     If any biopsies were taken you will be contacted by phone or by letter within the next 1-3 weeks.  Please call us at [phone] if you have not heard about the biopsies in 3 weeks.    SIGNATURES/CONFIDENTIALITY: You and/or your care partner have signed paperwork which will be entered into your electronic medical record.  These signatures attest to the fact that that the information above on your After Visit Summary has been reviewed and is understood.  Full responsibility of the confidentiality of this discharge information lies with you and/or your care-partner.

## 2023-02-03 NOTE — Progress Notes (Signed)
Called to room to assist during endoscopic procedure.  Patient ID and intended procedure confirmed with present staff. Received instructions for my participation in the procedure from the performing physician.  

## 2023-02-03 NOTE — Op Note (Signed)
[facility] Patient Name: Jamie Hicks Procedure Date: 02/03/2023 9:58 AM MRN: [id number] Endoscopist: Napoleon Form , MD, [id number] Age: 67 Referring MD:  Date of Birth: 07-25-1955 Gender: Female Account #: [id number] Procedure:                Colonoscopy Indications:              High risk colon cancer surveillance: Personal                            history of colonic polyps, High risk colon cancer                            surveillance: Personal history of adenoma (10 mm or                            greater in size) Medicines:                Monitored Anesthesia Care Procedure:                Pre-Anesthesia Assessment:                           - Prior to the procedure, a History and Physical                            was performed, and patient medications and                            allergies were reviewed. The patient's tolerance of                            previous anesthesia was also reviewed. The risks                            and benefits of the procedure and the sedation                            options and risks were discussed with the patient.                            All questions were answered, and informed consent                            was obtained. Prior Anticoagulants: The patient has                            taken no anticoagulant or antiplatelet agents. ASA                            Grade Assessment: III - A patient with severe                            systemic disease. After reviewing the risks and  benefits, the patient was deemed in satisfactory                            condition to undergo the procedure.                           After obtaining informed consent, the colonoscope                            was passed under direct vision. Throughout the                            procedure, the patient's blood pressure, pulse, and                            oxygen saturations were  monitored continuously. The                            Olympus Scope [id number] was introduced through the                            anus and advanced to the the cecum, identified by                            appendiceal orifice and ileocecal valve. The                            colonoscopy was performed without difficulty. The                            patient tolerated the procedure well. The quality                            of the bowel preparation was good. The ileocecal                            valve, appendiceal orifice, and rectum were                            photographed. Scope In: 10:06:34 AM Scope Out: 10:25:43 AM Scope Withdrawal Time: 0 hours 12 minutes 49 seconds  Total Procedure Duration: 0 hours 19 minutes 9 seconds  Findings:                 The perianal and digital rectal examinations were                            normal.                           Five sessile polyps were found in the sigmoid                            colon, descending colon, transverse colon and  ascending colon. The polyps were 5 to 7 mm in size.                            These polyps were removed with a cold snare.                            Resection and retrieval were complete.                           A few small-mouthed diverticula were found in the                            sigmoid colon and descending colon.                           Non-bleeding external and internal hemorrhoids were                            found during retroflexion. The hemorrhoids were                            small. Complications:            No immediate complications. Estimated Blood Loss:     Estimated blood loss was minimal. Impression:               - Five 5 to 7 mm polyps in the sigmoid colon, in                            the descending colon, in the transverse colon and                            in the ascending colon, removed with a cold snare.                             Resected and retrieved.                           - Diverticulosis in the sigmoid colon and in the                            descending colon.                           - Non-bleeding external and internal hemorrhoids. Recommendation:           - Patient has a contact number available for                            emergencies. The signs and symptoms of potential                            delayed complications were discussed with the  patient. Return to normal activities tomorrow.                            Written discharge instructions were provided to the                            patient.                           - Resume previous diet.                           - Continue present medications.                           - Await pathology results.                           - Repeat colonoscopy in 3 - 5 years for                            surveillance. Napoleon Form, MD 02/03/2023 10:38:29 AM This report has been signed electronically.

## 2023-02-03 NOTE — Progress Notes (Signed)
Sedate, gd SR, tolerated procedure well, VSS, report to RN 

## 2023-02-04 ENCOUNTER — Telehealth: Payer: Self-pay

## 2023-02-04 NOTE — Telephone Encounter (Signed)
  Follow up Call-     02/03/2023    9:33 AM  Call back number  Post procedure Call Back phone  # [phone]  Permission to leave phone message Yes     Patient questions:  Do you have a fever, pain , or abdominal swelling? No. Pain Score  0 *  Have you tolerated food without any problems? Yes.    Have you been able to return to your normal activities? Yes.    Do you have any questions about your discharge instructions: Diet   No. Medications  No. Follow up visit  No.  Do you have questions or concerns about your Care? No.  Actions: * If pain score is 4 or above: No action needed, pain <4.

## 2023-02-08 ENCOUNTER — Encounter (HOSPITAL_COMMUNITY): Payer: Self-pay | Admitting: Emergency Medicine

## 2023-02-08 ENCOUNTER — Ambulatory Visit (INDEPENDENT_AMBULATORY_CARE_PROVIDER_SITE_OTHER): Payer: 59

## 2023-02-08 ENCOUNTER — Ambulatory Visit (HOSPITAL_COMMUNITY)
Admission: EM | Admit: 2023-02-08 | Discharge: 2023-02-08 | Disposition: A | Discharge status: Home / Self Care | Payer: 59 | Attending: Family Medicine | Admitting: Family Medicine

## 2023-02-08 DIAGNOSIS — S80819A Abrasion, unspecified lower leg, initial encounter: Secondary | ICD-10-CM

## 2023-02-08 DIAGNOSIS — R0781 Pleurodynia: Secondary | ICD-10-CM

## 2023-02-08 DIAGNOSIS — Z23 Encounter for immunization: Secondary | ICD-10-CM | POA: Diagnosis not present

## 2023-02-08 MED ORDER — TETANUS-DIPHTH-ACELL PERTUSSIS 5-2.5-18.5 LF-MCG/0.5 IM SUSY
0.5000 mL | PREFILLED_SYRINGE | Freq: Once | INTRAMUSCULAR | Status: AC
  Administered 2023-02-08: 0.5 mL via INTRAMUSCULAR

## 2023-02-08 MED ORDER — TRAMADOL HCL 50 MG PO TABS: 50.0000 mg | ORAL_TABLET | Freq: Four times a day (QID) | ORAL | 0 refills | Status: AC | PRN

## 2023-02-08 MED ORDER — TETANUS-DIPHTH-ACELL PERTUSSIS 5-2.5-18.5 LF-MCG/0.5 IM SUSY
PREFILLED_SYRINGE | INTRAMUSCULAR | Status: AC
  Filled 2023-02-08: qty 0.5

## 2023-02-08 NOTE — ED Provider Notes (Signed)
[facility]    CSN: [id number] Arrival date & time: 02/08/23  1157      History   Chief Complaint Chief Complaint  Patient presents with   Fall    HPI Jamie Hicks is a 67 y.o. female.    Fall  Here for left arm pain and pain in her left ribs.  She fell 2 days ago when she missed a step coming out of church.  She does have abrasions on both lower legs and on her arm.  She took ibuprofen last night and is iced her knees   She also has noted some dizziness recently, though she was not experiencing that when she fell.   Past Medical History:  Diagnosis Date   Allergy    Anemia    ARDS (adult respiratory distress syndrome) (HCC)    Arthritis    OA   Back pain    Blind right eye    GERD (gastroesophageal reflux disease)    Hypertension    Thyroid disease     Patient Active Problem List   Diagnosis Date Noted   Snoring 03/09/2018   Daytime sleepiness 03/09/2018   Effusion, left knee 10/13/2017   Chronic pain of left knee 09/26/2017   Unilateral primary osteoarthritis, left knee 09/26/2017   Unilateral primary osteoarthritis, right knee 09/26/2017   Chronic pain of right knee 09/26/2017   Polysubstance abuse (HCC) 05/31/2017   Acute systolic (congestive) heart failure (HCC) 04/25/2017   Acute respiratory failure with hypoxia (HCC)    ARDS (adult respiratory distress syndrome) (HCC)    Acute respiratory distress    Community acquired pneumonia of left upper lobe of lung    Hypoxemia    Influenza A 04/18/2017   Left upper lobe pneumonia 04/18/2017   Obesity, Class III, BMI 40-49.9 (morbid obesity) (HCC) 04/18/2017   Thyroid activity decreased    Cocaine dependence with cocaine-induced mood disorder (HCC) 07/22/2014   Major depressive disorder, recurrent, severe without psychotic features (HCC)    Substance induced mood disorder (HCC) 07/19/2014   Suicidal ideation    Homicidal ideation    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   High myopia 10/10/2013    Hx of retinal detachment 10/10/2013   Nuclear sclerosis of both eyes 10/10/2013   Osteoarthritis 09/24/2012   Prediabetes 09/24/2012   Dyslipidemia 09/24/2012   Vitamin D insufficiency 09/24/2012   Hypothyroidism 09/22/2012   Bilateral chronic knee pain 09/22/2012   Smoker 09/22/2012   Hypertension 09/22/2012    Past Surgical History:  Procedure Laterality Date   HERNIA REPAIR     knee arthorscopy      OB History     Gravida  5   Para      Term      Preterm      AB  1   Living  4      SAB      IAB  1   Ectopic      Multiple      Live Births  4            Home Medications    Prior to Admission medications   Medication Sig Start Date End Date Taking? Authorizing Provider  traMADol (ULTRAM) 50 MG tablet Take 1 tablet (50 mg total) by mouth every 6 (six) hours as needed (pain). 02/08/23  Yes Zenia Resides, MD  acetaminophen (TYLENOL) 650 MG CR tablet Take 650 mg by mouth every 8 (eight) hours as needed for pain.  [provider]  amLODipine (NORVASC) 2.5 MG tablet TAKE 1 TABLET BY MOUTH EVERY DAY Patient not taking: Reported on 02/03/2023 01/27/22   Gaston Islam., NP  ascorbic acid (VITAMIN C) 100 MG tablet Take 100 mg by mouth daily. Patient not taking: Reported on 02/03/2023    [provider]  aspirin EC 81 MG tablet Take 81 mg by mouth daily.    [provider]  atorvastatin (LIPITOR) 20 MG tablet TAKE 1 TABLET BY MOUTH EVERYDAY AT BEDTIME Patient not taking: Reported on 02/03/2023 09/09/20   Chrystie Nose, MD  cetirizine (ZYRTEC) 10 MG tablet TAKE 1 TABLET BY MOUTH EVERY DAY AS NEEDED FOR ALLERGY Patient not taking: Reported on 02/03/2023 02/15/19   [provider]  cycloSPORINE (RESTASIS) 0.05 % ophthalmic emulsion Place 1 drop into both eyes 2 times daily. 07/11/20   [provider]  famotidine (PEPCID) 20 MG tablet TAKE 1 TABLET BY MOUTH TWICE A DAY Patient not taking: Reported on 02/03/2023  08/30/19   Chrystie Nose, MD  folic acid (FOLVITE) 1 MG tablet Take 1 mg by mouth daily. Patient not taking: Reported on 02/03/2023 02/10/19   [provider]  furosemide (LASIX) 20 MG tablet TAKE 1 TABLET BY MOUTH EVERY DAY Patient not taking: Reported on 02/03/2023 11/24/21   Chrystie Nose, MD  gabapentin (NEURONTIN) 300 MG capsule Take 300 mg by mouth 3 (three) times daily. Patient not taking: Reported on 02/03/2023 06/29/21   [provider]  hydroxypropyl methylcellulose / hypromellose (ISOPTO TEARS / GONIOVISC) 2.5 % ophthalmic solution Place 1 drop into both eyes 2 (two) times daily. Patient not taking: Reported on 02/03/2023    [provider]  ibuprofen (ADVIL) 800 MG tablet TAKE 1 TABLET BY MOUTH EVERY 8 HOURS AS NEEDED 12/24/22   Kathryne Hitch, MD  ipratropium (ATROVENT) 0.06 % nasal spray Place 2 sprays into both nostrils 4 (four) times daily. For allergies 06/05/17   Belinda Fisher, PA-C  levothyroxine (SYNTHROID) 150 MCG tablet Take 150 mcg by mouth every morning. 08/26/20   [provider]  losartan (COZAAR) 100 MG tablet TAKE 1 TABLET BY MOUTH EVERY DAY 12/24/22   Hilty, Lisette Abu, MD  Vitamin D, Ergocalciferol, (DRISDOL) 1.25 MG (50000 UT) CAPS capsule Take 50,000 Units by mouth once a week. 02/10/19   [provider]  loratadine (CLARITIN) 10 MG tablet Take 1 tablet (10 mg total) by mouth daily. (This medicine may be purchased from over there counter at yr local pharmacy): For allergies 06/05/17 10/29/19  Belinda Fisher, PA-C    Family History Family History  Problem Relation Age of Onset   Hypertension Mother        Living, currently 26 years old   Cancer Father        unknown   Colon cancer Neg Hx    Colon polyps Neg Hx    Esophageal cancer Neg Hx    Rectal cancer Neg Hx    Stomach cancer Neg Hx     Social History Social History   Tobacco Use   Smoking status: Former    Current packs/day: 0.00    Average packs/day: 1  pack/day for 40.0 years (40.0 ttl pk-yrs)    Types: Cigarettes    Start date: 05/01/1977    Quit date: 05/01/2017    Years since quitting: 5.7   Smokeless tobacco: Never  Vaping Use   Vaping status: Never Used  Substance Use Topics   Alcohol  use: Not Currently    Comment: none in the last    Drug use: Not Currently    Types: "Crack" cocaine     Allergies   Amoxicillin, Lisinopril, and Latex   Review of Systems Review of Systems   Physical Exam Triage Vital Signs ED Triage Vitals  Encounter Vitals Group     BP 02/08/23 1252 (!) 167/81     Systolic BP Percentile --      Diastolic BP Percentile --      Pulse Rate 02/08/23 1252 (!) 47     Resp 02/08/23 1252 14     Temp 02/08/23 1252 97.6 F (36.4 C)     Temp Source 02/08/23 1252 Oral     SpO2 02/08/23 1252 98 %     Weight --      Height --      Head Circumference --      Peak Flow --      Pain Score 02/08/23 1251 8     Pain Loc --      Pain Education --      Exclude from Growth Chart --    No data found.  Updated Vital Signs BP (!) 167/81 (BP Location: Right Arm)   Pulse (!) 47   Temp 97.6 F (36.4 C) (Oral)   Resp 14   LMP 07/21/2014 Comment: current  SpO2 98%   Visual Acuity Right Eye Distance:   Left Eye Distance:   Bilateral Distance:    Right Eye Near:   Left Eye Near:    Bilateral Near:     Physical Exam Vitals reviewed.  Constitutional:      General: She is not in acute distress.    Appearance: She is not ill-appearing, toxic-appearing or diaphoretic.  HENT:     Nose: Nose normal.     Mouth/Throat:     Mouth: Mucous membranes are moist.  Cardiovascular:     Rate and Rhythm: Regular rhythm. Bradycardia present.     Heart sounds: No murmur heard. Pulmonary:     Effort: Pulmonary effort is normal.     Breath sounds: Normal breath sounds.  Chest:     Chest wall: Tenderness (left lower ribs in mid clavicular line) present.  Skin:    Coloration: Skin is not jaundiced or pale.      Comments: There are shallow abrasions on her left forearm at the elbow, and both shins.  Bleeding is controlled    Neurological:     Mental Status: She is alert and oriented to person, place, and time.  Psychiatric:        Behavior: Behavior normal.      UC Treatments / Results  Labs (all labs ordered are listed, but only abnormal results are displayed) Labs Reviewed - No data to display  EKG   Radiology No results found.  Procedures Procedures (including critical care time)  Medications Ordered in UC Medications  Tdap (BOOSTRIX) injection 0.5 mL (has no administration in time range)    Initial Impression / Assessment and Plan / UC Course  I have reviewed the triage vital signs and the nursing notes.  Pertinent labs & imaging results that were available during my care of the patient were reviewed by me and considered in my medical decision making (see chart for details).       EKG was done as her heart rate was in the upper 40s at triage.  The EKG showed sinus bradycardia with a heart rate of 53.  Blood pressure is 167/81; she will follow-up with her doctor later this week  X-rays by my review did not show any acute rib fractures, especially not where she is hurting.  Tramadol was sent in for pain relief.  Tdap is given to boost her tetanus immunity Final Clinical Impressions(s) / UC Diagnoses   Final diagnoses:  Rib pain on left side  Abrasion, leg w/o infection     Discharge Instructions      By my review, your x-rays do not show any acute rib fractures.  I do think where you are hurting is more cartilage and would be difficult to see a rib fracture on plain x-ray  The radiologist will look at your x-rays and if they see anything significantly different, I will call you.  Take tramadol 50 mg-- 1 tablet every 6 hours as needed for pain.  This medication can make you sleepy or dizzy      ED Prescriptions     Medication Sig Dispense Auth. Provider    traMADol (ULTRAM) 50 MG tablet Take 1 tablet (50 mg total) by mouth every 6 (six) hours as needed (pain). 12 tablet Napoleon Monacelli, Janace Aris, MD      I have reviewed the PDMP during this encounter.   Zenia Resides, MD 02/08/23 1351

## 2023-02-08 NOTE — ED Triage Notes (Signed)
Pt fell Sunday when missed step coming out of church. Pt has abrasions to bilateral lower legs, left arm. Pt c/o left arm pain and left rib pain.  Took Ibuprofen last night and iced bilateral knees.

## 2023-02-08 NOTE — Discharge Instructions (Signed)
By my review, your x-rays do not show any acute rib fractures.  I do think where you are hurting is more cartilage and would be difficult to see a rib fracture on plain x-ray  The radiologist will look at your x-rays and if they see anything significantly different, I will call you.  Take tramadol 50 mg-- 1 tablet every 6 hours as needed for pain.  This medication can make you sleepy or dizzy

## 2023-02-13 ENCOUNTER — Encounter (HOSPITAL_COMMUNITY): Payer: Self-pay | Admitting: Emergency Medicine

## 2023-02-13 ENCOUNTER — Ambulatory Visit (HOSPITAL_COMMUNITY)
Admission: EM | Admit: 2023-02-13 | Discharge: 2023-02-13 | Disposition: A | Discharge status: Home / Self Care | Payer: 59 | Attending: Nurse Practitioner | Admitting: Nurse Practitioner

## 2023-02-13 DIAGNOSIS — U071 COVID-19: Secondary | ICD-10-CM | POA: Insufficient documentation

## 2023-02-13 DIAGNOSIS — Z1152 Encounter for screening for COVID-19: Secondary | ICD-10-CM | POA: Diagnosis present

## 2023-02-13 DIAGNOSIS — T148XXA Other injury of unspecified body region, initial encounter: Secondary | ICD-10-CM | POA: Diagnosis not present

## 2023-02-13 DIAGNOSIS — J069 Acute upper respiratory infection, unspecified: Secondary | ICD-10-CM | POA: Diagnosis present

## 2023-02-13 MED ORDER — BENZONATATE 100 MG PO CAPS: 100.0000 mg | ORAL_CAPSULE | Freq: Three times a day (TID) | ORAL | 0 refills | Status: AC | PRN

## 2023-02-13 MED ORDER — MUPIROCIN 2 % EX OINT: 1.0000 | TOPICAL_OINTMENT | Freq: Two times a day (BID) | CUTANEOUS | 0 refills | Status: AC

## 2023-02-13 NOTE — ED Triage Notes (Signed)
Pt reports that having intermittent epigastric pains that started this morning.   Pt had cough, congestion, headache since Friday. Found out exposed to Covid and would like a test.   Pt also concerned about redness around the abrasions on bilateral lower legs from fall last Sunday. Pt was seen here on 8/27 and was started on antibiotics.

## 2023-02-13 NOTE — ED Provider Notes (Signed)
[facility]    CSN: [id number] Arrival date & time: 02/13/23  1316      History   Chief Complaint Chief Complaint  Patient presents with   Abdominal Pain   Wound Check   Covid Exposure    HPI Jamie Hicks is a 67 y.o. female.   Patient presents today with 3-day history of congested cough with clear mucus, runny and stuffy nose, headache that was severe on Friday and has improved somewhat, and lower chest pain that began this morning and goes across her rib cage.  She thinks the chest pain is coming from coughing because the pain comes and goes when she coughs and is worse with coughing.  She denies fever, body aches or chills, shortness of breath, chest tightness, sore throat, abdominal pain, nausea/vomiting, diarrhea, change in appetite, and fatigue.  Reports she was exposed to COVID-19 1 week ago and her symptoms began 4 days after.  Has not taken anything for symptoms so far, plans to pick up Coricidin at the pharmacy today.  Patient also is concerned about redness around both lower extremity wounds.  Reports she fell going up concrete steps 1 week ago and was seen by PCP and states she is currently taking doxycycline for the wounds.  She has been cleaning the wounds with Dial soap and also peroxide.    Past Medical History:  Diagnosis Date   Allergy    Anemia    ARDS (adult respiratory distress syndrome) (HCC)    Arthritis    OA   Back pain    Blind right eye    GERD (gastroesophageal reflux disease)    Hypertension    Thyroid disease     Patient Active Problem List   Diagnosis Date Noted   Snoring 03/09/2018   Daytime sleepiness 03/09/2018   Effusion, left knee 10/13/2017   Chronic pain of left knee 09/26/2017   Unilateral primary osteoarthritis, left knee 09/26/2017   Unilateral primary osteoarthritis, right knee 09/26/2017   Chronic pain of right knee 09/26/2017   Polysubstance abuse (HCC) 05/31/2017   Acute systolic (congestive) heart  failure (HCC) 04/25/2017   Acute respiratory failure with hypoxia (HCC)    ARDS (adult respiratory distress syndrome) (HCC)    Acute respiratory distress    Community acquired pneumonia of left upper lobe of lung    Hypoxemia    Influenza A 04/18/2017   Left upper lobe pneumonia 04/18/2017   Obesity, Class III, BMI 40-49.9 (morbid obesity) (HCC) 04/18/2017   Thyroid activity decreased    Cocaine dependence with cocaine-induced mood disorder (HCC) 07/22/2014   Major depressive disorder, recurrent, severe without psychotic features (HCC)    Substance induced mood disorder (HCC) 07/19/2014   Suicidal ideation    Homicidal ideation    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   High myopia 10/10/2013   Hx of retinal detachment 10/10/2013   Nuclear sclerosis of both eyes 10/10/2013   Osteoarthritis 09/24/2012   Prediabetes 09/24/2012   Dyslipidemia 09/24/2012   Vitamin D insufficiency 09/24/2012   Hypothyroidism 09/22/2012   Bilateral chronic knee pain 09/22/2012   Smoker 09/22/2012   Hypertension 09/22/2012    Past Surgical History:  Procedure Laterality Date   HERNIA REPAIR     knee arthorscopy      OB History     Gravida  5   Para      Term      Preterm      AB  1   Living  4  SAB      IAB  1   Ectopic      Multiple      Live Births  4            Home Medications    Prior to Admission medications   Medication Sig Start Date End Date Taking? Authorizing Provider  benzonatate (TESSALON) 100 MG capsule Take 1 capsule (100 mg total) by mouth 3 (three) times daily as needed for cough. Do not take with alcohol or while driving or operating heavy machinery.  May cause drowsiness. 02/13/23  Yes Valentino Nose, NP  doxycycline (VIBRA-TABS) 100 MG tablet Take 100 mg by mouth 2 (two) times daily. 02/11/23  Yes [provider]  mupirocin ointment (BACTROBAN) 2 % Apply 1 Application topically 2 (two) times daily for 5 days. 02/13/23 02/18/23 Yes Valentino Nose, NP  acetaminophen (TYLENOL) 650 MG CR tablet Take 650 mg by mouth every 8 (eight) hours as needed for pain.    [provider]  amLODipine (NORVASC) 2.5 MG tablet TAKE 1 TABLET BY MOUTH EVERY DAY Patient not taking: Reported on 02/03/2023 01/27/22   Gaston Islam., NP  ascorbic acid (VITAMIN C) 100 MG tablet Take 100 mg by mouth daily. Patient not taking: Reported on 02/03/2023    [provider]  aspirin EC 81 MG tablet Take 81 mg by mouth daily.    [provider]  atorvastatin (LIPITOR) 20 MG tablet TAKE 1 TABLET BY MOUTH EVERYDAY AT BEDTIME Patient not taking: Reported on 02/03/2023 09/09/20   Chrystie Nose, MD  cetirizine (ZYRTEC) 10 MG tablet TAKE 1 TABLET BY MOUTH EVERY DAY AS NEEDED FOR ALLERGY Patient not taking: Reported on 02/03/2023 02/15/19   [provider]  cycloSPORINE (RESTASIS) 0.05 % ophthalmic emulsion Place 1 drop into both eyes 2 times daily. 07/11/20   [provider]  famotidine (PEPCID) 20 MG tablet TAKE 1 TABLET BY MOUTH TWICE A DAY Patient not taking: Reported on 02/03/2023 08/30/19   Chrystie Nose, MD  folic acid (FOLVITE) 1 MG tablet Take 1 mg by mouth daily. Patient not taking: Reported on 02/03/2023 02/10/19   [provider]  furosemide (LASIX) 20 MG tablet TAKE 1 TABLET BY MOUTH EVERY DAY Patient not taking: Reported on 02/03/2023 11/24/21   Chrystie Nose, MD  gabapentin (NEURONTIN) 300 MG capsule Take 300 mg by mouth 3 (three) times daily. Patient not taking: Reported on 02/03/2023 06/29/21   [provider]  hydroxypropyl methylcellulose / hypromellose (ISOPTO TEARS / GONIOVISC) 2.5 % ophthalmic solution Place 1 drop into both eyes 2 (two) times daily. Patient not taking: Reported on 02/03/2023    [provider]  ibuprofen (ADVIL) 800 MG tablet TAKE 1 TABLET BY MOUTH EVERY 8 HOURS AS NEEDED 12/24/22   Kathryne Hitch, MD  ipratropium (ATROVENT) 0.06 % nasal spray Place 2 sprays into  both nostrils 4 (four) times daily. For allergies 06/05/17   Belinda Fisher, PA-C  levothyroxine (SYNTHROID) 150 MCG tablet Take 150 mcg by mouth every morning. 08/26/20   [provider]  losartan (COZAAR) 100 MG tablet TAKE 1 TABLET BY MOUTH EVERY DAY 12/24/22   Hilty, Lisette Abu, MD  traMADol (ULTRAM) 50 MG tablet Take 1 tablet (50 mg total) by mouth every 6 (six) hours as needed (pain). 02/08/23   Zenia Resides, MD  Vitamin D, Ergocalciferol, (DRISDOL) 1.25 MG (50000 UT) CAPS capsule Take 50,000 Units by mouth once a week.  02/10/19   [provider]  loratadine (CLARITIN) 10 MG tablet Take 1 tablet (10 mg total) by mouth daily. (This medicine may be purchased from over there counter at yr local pharmacy): For allergies 06/05/17 10/29/19  Belinda Fisher, PA-C    Family History Family History  Problem Relation Age of Onset   Hypertension Mother        Living, currently 41 years old   Cancer Father        unknown   Colon cancer Neg Hx    Colon polyps Neg Hx    Esophageal cancer Neg Hx    Rectal cancer Neg Hx    Stomach cancer Neg Hx     Social History Social History   Tobacco Use   Smoking status: Former    Current packs/day: 0.00    Average packs/day: 1 pack/day for 40.0 years (40.0 ttl pk-yrs)    Types: Cigarettes    Start date: 05/01/1977    Quit date: 05/01/2017    Years since quitting: 5.7   Smokeless tobacco: Never  Vaping Use   Vaping status: Never Used  Substance Use Topics   Alcohol use: Not Currently    Comment: none in the last    Drug use: Not Currently    Types: "Crack" cocaine     Allergies   Amoxicillin, Lisinopril, and Latex   Review of Systems Review of Systems Per HPI  Physical Exam Triage Vital Signs ED Triage Vitals [02/13/23 1437]  Encounter Vitals Group     BP (!) 166/80     Systolic BP Percentile      Diastolic BP Percentile      Pulse Rate (!) 50     Resp 18     Temp 98.3 F (36.8 C)     Temp Source Oral     SpO2 98 %      Weight      Height      Head Circumference      Peak Flow      Pain Score 7     Pain Loc      Pain Education      Exclude from Growth Chart    No data found.  Updated Vital Signs BP (!) 166/80 (BP Location: Right Arm)   Pulse (!) 50   Temp 98.3 F (36.8 C) (Oral)   Resp 18   LMP 07/21/2014 Comment: current  SpO2 98%   Visual Acuity Right Eye Distance:   Left Eye Distance:   Bilateral Distance:    Right Eye Near:   Left Eye Near:    Bilateral Near:     Physical Exam Vitals and nursing note reviewed.  Constitutional:      General: She is not in acute distress.    Appearance: Normal appearance. She is not ill-appearing or toxic-appearing.  HENT:     Head: Normocephalic and atraumatic.     Right Ear: Tympanic membrane, ear canal and external ear normal.     Left Ear: Tympanic membrane, ear canal and external ear normal.     Nose: Congestion and rhinorrhea present.     Mouth/Throat:     Mouth: Mucous membranes are moist.     Pharynx: Oropharynx is clear. Posterior oropharyngeal erythema present. No oropharyngeal exudate.  Eyes:     General: No scleral icterus.    Extraocular Movements: Extraocular movements intact.  Cardiovascular:     Rate and Rhythm: Normal rate and regular rhythm.     Heart  sounds: Normal heart sounds. No murmur heard. Pulmonary:     Effort: Pulmonary effort is normal. No respiratory distress.     Breath sounds: Normal breath sounds. No wheezing, rhonchi or rales.  Abdominal:     General: Abdomen is flat. Bowel sounds are normal. There is no distension.     Palpations: Abdomen is soft.  Musculoskeletal:     Cervical back: Normal range of motion and neck supple.  Lymphadenopathy:     Cervical: Cervical adenopathy present.  Skin:    General: Skin is warm and dry.     Coloration: Skin is not jaundiced or pale.     Findings: No erythema or rash.     Comments: Abrasions noted to bilateral shins approximately 5 cm in length.  Scab formation and  there is surrounding erythema, however no warmth, tenderness, active drainage, oozing.  No odor.  Neurological:     Mental Status: She is alert and oriented to person, place, and time.     Motor: No weakness.  Psychiatric:        Mood and Affect: Mood normal.        Behavior: Behavior normal.      UC Treatments / Results  Labs (all labs ordered are listed, but only abnormal results are displayed) Labs Reviewed  SARS CORONAVIRUS 2 (TAT 6-24 HRS)    EKG   Radiology No results found.  Procedures Procedures (including critical care time)  Medications Ordered in UC Medications - No data to display  Initial Impression / Assessment and Plan / UC Course  I have reviewed the triage vital signs and the nursing notes.  Pertinent labs & imaging results that were available during my care of the patient were reviewed by me and considered in my medical decision making (see chart for details).   Patient is well-appearing, normotensive, afebrile, not tachycardic, not tachypneic, oxygenating well on room air.  Patient is bradycardic at baseline, plans to follow-up with PCP regarding this.  1. Viral URI with cough 2. Encounter for screening for COVID-19 Suspect viral etiology Vitals and exam reassuring COVID-19 testing obtained Patient is a candidate for molnupiravir if positive Supportive care discussed Start Tessalon Perles Strict ER and return precautions discussed with patient  3. Abrasion Wound care discussed; patient was seen and Tdap was updated already Start Bactroban ointment Seek care for persistent/worsening symptoms despite treatment  The patient was given the opportunity to ask questions.  All questions answered to their satisfaction.  The patient is in agreement to this plan.    Final Clinical Impressions(s) / UC Diagnoses   Final diagnoses:  Viral URI with cough  Encounter for screening for COVID-19  Abrasion     Discharge Instructions      You have a  viral upper respiratory infection.  Symptoms should improve over the next week to 10 days.  If the chest pain or shortness of breath worsens, please seek care in the emergency room.    We have tested you for COVID-19 today.  We will contact you with results are abnormal tomorrow and will prescribe vomiting.  We are.  Recommend staying home until you are fever free for 24 hours without fever reducing medication.   Some things that can make you feel better are: - Increased rest - Increasing fluid with water/sugar free electrolytes - Acetaminophen and ibuprofen as needed for fever/pain - Salt water gargling, chloraseptic spray and throat lozenges - OTC guaifenesin (Mucinex) 600 mg twice daily - Saline sinus flushes or  a neti pot - Humidifying the air -Tessalon Perles every 8 hours as needed for dry cough   For the abrasions on your legs, continue wound care with cleaning twice daily with mild soap and water.  You can apply the Bactroban ointment that has been sent to the pharmacy twice daily to help prevent infection.  Continue doxycycline to completion.      ED Prescriptions     Medication Sig Dispense Auth. Provider   mupirocin ointment (BACTROBAN) 2 % Apply 1 Application topically 2 (two) times daily for 5 days. 22 g Cathlean Marseilles A, NP   benzonatate (TESSALON) 100 MG capsule Take 1 capsule (100 mg total) by mouth 3 (three) times daily as needed for cough. Do not take with alcohol or while driving or operating heavy machinery.  May cause drowsiness. 21 capsule Valentino Nose, NP      PDMP not reviewed this encounter.   Valentino Nose, NP 02/13/23 [phone]

## 2023-02-13 NOTE — Discharge Instructions (Addendum)
You have a viral upper respiratory infection.  Symptoms should improve over the next week to 10 days.  If the chest pain or shortness of breath worsens, please seek care in the emergency room.    We have tested you for COVID-19 today.  We will contact you with results are abnormal tomorrow and will prescribe vomiting.  We are.  Recommend staying home until you are fever free for 24 hours without fever reducing medication.   Some things that can make you feel better are: - Increased rest - Increasing fluid with water/sugar free electrolytes - Acetaminophen and ibuprofen as needed for fever/pain - Salt water gargling, chloraseptic spray and throat lozenges - OTC guaifenesin (Mucinex) 600 mg twice daily - Saline sinus flushes or a neti pot - Humidifying the air -Tessalon Perles every 8 hours as needed for dry cough   For the abrasions on your legs, continue wound care with cleaning twice daily with mild soap and water.  You can apply the Bactroban ointment that has been sent to the pharmacy twice daily to help prevent infection.  Continue doxycycline to completion.

## 2023-02-14 ENCOUNTER — Telehealth (HOSPITAL_COMMUNITY): Payer: Self-pay | Admitting: Nurse Practitioner

## 2023-02-14 LAB — SARS CORONAVIRUS 2 (TAT 6-24 HRS): SARS Coronavirus 2: POSITIVE — AB

## 2023-02-14 MED ORDER — MOLNUPIRAVIR EUA 200MG CAPSULE: 4.0000 | ORAL_CAPSULE | Freq: Two times a day (BID) | ORAL | 0 refills | Status: AC

## 2023-02-14 NOTE — Telephone Encounter (Signed)
Call patient discussed positive COVID-19 result.  Start molnupiravir.  Prescription sent to pharmacy.

## 2023-02-16 ENCOUNTER — Other Ambulatory Visit: Payer: Self-pay | Admitting: Nurse Practitioner

## 2023-02-16 ENCOUNTER — Ambulatory Visit: Payer: 59 | Admitting: Podiatry

## 2023-02-18 ENCOUNTER — Encounter: Payer: Self-pay | Admitting: Gastroenterology

## 2023-03-07 ENCOUNTER — Ambulatory Visit: Payer: 59 | Admitting: Orthopaedic Surgery

## 2023-03-07 ENCOUNTER — Encounter: Payer: Self-pay | Admitting: Orthopaedic Surgery

## 2023-03-07 DIAGNOSIS — S8012XD Contusion of left lower leg, subsequent encounter: Secondary | ICD-10-CM | POA: Diagnosis not present

## 2023-03-07 DIAGNOSIS — S8011XD Contusion of right lower leg, subsequent encounter: Secondary | ICD-10-CM

## 2023-03-07 NOTE — Progress Notes (Signed)
The patient comes in today having had at least 6 falls over the last year.  She fell last in August and sustained contusions to both of her shins over the iliac crest.  She has been treating these with local wound care and ointments.  She cleans them with Dial soap and the ointment is mupirocin.  She actually had x-rays back in early September and there was no fractures.  She does not walk with assistive device but her primary care physician did recommend she try to ambulate with a cane if she is having falls.  All of these falls have been from accidental trips and she was doing a lot of activities when these happen.  She does not walk with a limp or an assistive device today.  Examination of both her tibias show her anterior tibia abrasions are healing appropriately.  There is no evidence of infection.  She has good motion of her knees and ankles.  I did counsel her about her having multiple falls.  I recommended she only ambulate with an assistive device.  She would benefit from outpatient physical therapy to work on balance and coordination as well as bilateral lower extremity strengthening.

## 2023-03-08 ENCOUNTER — Other Ambulatory Visit: Payer: Self-pay

## 2023-03-08 DIAGNOSIS — S8011XD Contusion of right lower leg, subsequent encounter: Secondary | ICD-10-CM

## 2023-03-08 DIAGNOSIS — S8012XD Contusion of left lower leg, subsequent encounter: Secondary | ICD-10-CM

## 2023-03-08 DIAGNOSIS — G8929 Other chronic pain: Secondary | ICD-10-CM

## 2023-03-24 ENCOUNTER — Other Ambulatory Visit: Payer: Self-pay | Admitting: Nurse Practitioner

## 2023-03-28 ENCOUNTER — Other Ambulatory Visit: Payer: Self-pay

## 2023-03-28 ENCOUNTER — Ambulatory Visit: Payer: 59 | Attending: Orthopaedic Surgery

## 2023-03-28 DIAGNOSIS — G8929 Other chronic pain: Secondary | ICD-10-CM | POA: Insufficient documentation

## 2023-03-28 DIAGNOSIS — R2689 Other abnormalities of gait and mobility: Secondary | ICD-10-CM | POA: Diagnosis present

## 2023-03-28 DIAGNOSIS — M6281 Muscle weakness (generalized): Secondary | ICD-10-CM | POA: Insufficient documentation

## 2023-03-28 DIAGNOSIS — S8012XD Contusion of left lower leg, subsequent encounter: Secondary | ICD-10-CM | POA: Diagnosis not present

## 2023-03-28 DIAGNOSIS — M25561 Pain in right knee: Secondary | ICD-10-CM | POA: Insufficient documentation

## 2023-03-28 DIAGNOSIS — S8011XD Contusion of right lower leg, subsequent encounter: Secondary | ICD-10-CM | POA: Insufficient documentation

## 2023-03-28 DIAGNOSIS — M25562 Pain in left knee: Secondary | ICD-10-CM | POA: Insufficient documentation

## 2023-03-28 NOTE — Therapy (Signed)
OUTPATIENT PHYSICAL THERAPY LOWER EXTREMITY EVALUATION   Patient Name: Jamie Hicks MRN: [id number] DOB:01-28-1956, 67 y.o., female Today's Date: 03/28/2023  END OF SESSION:  PT End of Session - 03/28/23 1141     Visit Number 1    Number of Visits 9    Date for PT Re-Evaluation 05/23/23    Authorization Type [employer] MCR    PT Start Time 1100    PT Stop Time 1135    PT Time Calculation (min) 35 min    Activity Tolerance Patient tolerated treatment well    Behavior During Therapy WFL for tasks assessed/performed             Past Medical History:  Diagnosis Date   Allergy    Anemia    ARDS (adult respiratory distress syndrome) (HCC)    Arthritis    OA   Back pain    Blind right eye    GERD (gastroesophageal reflux disease)    Hypertension    Thyroid disease    Past Surgical History:  Procedure Laterality Date   HERNIA REPAIR     knee arthorscopy     Patient Active Problem List   Diagnosis Date Noted   Snoring 03/09/2018   Daytime sleepiness 03/09/2018   Effusion, left knee 10/13/2017   Chronic pain of left knee 09/26/2017   Unilateral primary osteoarthritis, left knee 09/26/2017   Unilateral primary osteoarthritis, right knee 09/26/2017   Chronic pain of right knee 09/26/2017   Polysubstance abuse (HCC) 05/31/2017   Acute systolic (congestive) heart failure (HCC) 04/25/2017   Acute respiratory failure with hypoxia (HCC)    ARDS (adult respiratory distress syndrome) (HCC)    Acute respiratory distress    Community acquired pneumonia of left upper lobe of lung    Hypoxemia    Influenza A 04/18/2017   Left upper lobe pneumonia 04/18/2017   Obesity, Class III, BMI 40-49.9 (morbid obesity) (HCC) 04/18/2017   Thyroid activity decreased    Cocaine dependence with cocaine-induced mood disorder (HCC) 07/22/2014   Major depressive disorder, recurrent, severe without psychotic features (HCC)    Substance induced mood disorder (HCC) 07/19/2014   Suicidal  ideation    Homicidal ideation    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   High myopia 10/10/2013   Hx of retinal detachment 10/10/2013   Nuclear sclerosis of both eyes 10/10/2013   Osteoarthritis 09/24/2012   Prediabetes 09/24/2012   Dyslipidemia 09/24/2012   Vitamin D insufficiency 09/24/2012   Hypothyroidism 09/22/2012   Bilateral chronic knee pain 09/22/2012   Smoker 09/22/2012   Hypertension 09/22/2012   PCP: Fleet Contras, MD  REFERRING PROVIDER: Kathryne Hitch, MD   REFERRING DIAG:  S80.12XD (ICD-10-CM) - Contusion of left leg, subsequent encounter S80.11XD (ICD-10-CM) - Contusion of right leg, subsequent encounter M25.562,G89.29 (ICD-10-CM) - Chronic pain of left knee M25.561,G89.29 (ICD-10-CM) - Chronic pain of right knee  THERAPY DIAG:  Chronic pain of left knee  Muscle weakness (generalized)  Other abnormalities of gait and mobility  Rationale for Evaluation and Treatment: Rehabilitation  ONSET DATE: 02/06/23  SUBJECTIVE:   SUBJECTIVE STATEMENT: Pt presents to PT with reports of chronic LE weakness and frequent falls, most recent on 02/06/23 when she was light headed because she was fasting. Notes falls as mostly mechanical in nature, [place] and has fallen because the sidewalk and pavement is uneven. Had significant contusions on bilateral shins, these are healing well and she mostly just has knee pain now. Walks as main mode of transportation,  would like to improve her walking endurance from greater than 30 minutes like it is currently.   PERTINENT HISTORY: HTN, CHF, Depression  PAIN:  Are you having pain?  No: NPRS scale: 0/10 Worst: 8/10 Pain location: bilateral knees, L>R Pain description: sharp, deep ache Aggravating factors: sleeping, cold Relieving factors: heat, medication  PRECAUTIONS: Fall  RED FLAGS: None   WEIGHT BEARING RESTRICTIONS: No  FALLS:  Has patient fallen in last 6 months? Yes. Number of falls - 6 - mostly  mechanical  LIVING ENVIRONMENT: Lives with: lives alone Lives in: House/apartment Stairs: Elevator access Has following equipment at home: Single point cane  OCCUPATION: Retired  PLOF: Independent  PATIENT GOALS: Pt would like to decrease her knee pain and improve her walking distance  NEXT MD VISIT: 04/18/2023  OBJECTIVE:  Note: Objective measures were completed at Evaluation unless otherwise noted.  DIAGNOSTIC FINDINGS:   N/A  PATIENT SURVEYS:  FOTO: 68% function; 71% predicted   COGNITION: Overall cognitive status: Within functional limits for tasks assessed   SENSATION: WFL  EDEMA:  DNT  MUSCLE LENGTH: Hamstrings: Right WFL; Left WNL Thomas test: Right (-); Left (-)   POSTURE: rounded shoulders and forward head  PALPATION: TTP to L quad  LOWER EXTREMITY MMT:  MMT Right eval Left eval  Hip flexion 5/5 4/5  Hip extension    Hip abduction 5/5 4/5  Hip adduction    Hip internal rotation    Hip external rotation    Knee flexion 5/5 4/5  Knee extension 5/5 5/5  Ankle dorsiflexion    Ankle plantarflexion    Ankle inversion    Ankle eversion     (Blank rows = not tested)  LOWER EXTREMITY SPECIAL TESTS:  DNT  FUNCTIONAL TESTS:  30 Second Sit to Stand: 10 reps  GAIT: Distance walked: 67ft Assistive device utilized: None Level of assistance: Complete Independence Comments: trunk flexed - otherwise no deviations  TREATMENT: [facility] Adult PT Treatment:                                                DATE: 03/28/2023 Therapeutic Exercise: Supine SLR x 15 each S/L hip abd x 10 each Seated clamshell x 15 blue band LAQ x 5 each  PATIENT EDUCATION:  Education details: eval findings, FOTO, HEP, POC Person educated: Patient Education method: Explanation, Demonstration, and Handouts Education comprehension: verbalized understanding and returned demonstration  HOME EXERCISE PROGRAM: Access Code: [email or web address] URL:  [email or web address] Date: 03/28/2023 Prepared by: Edwinna Areola  Exercises - Active Straight Leg Raise with Quad Set  - 1 x daily - 7 x weekly - 2-3 sets - 15 reps - Sidelying Hip Abduction  - 1 x daily - 7 x weekly - 2-3 sets - 10 reps - Seated Hip Abduction with Resistance  - 1 x daily - 7 x weekly - 3 sets - 15 reps - blue band hold - Seated Long Arc Quad  - 1 x daily - 7 x weekly - 3 sets - 10 reps - 5 sec hold  ASSESSMENT:  CLINICAL IMPRESSION: Patient is a 67 y.o. F who was seen today for physical therapy evaluation and treatment for weakness and imbalance. Physical findings are consistent with MD impression as pt demonstrates decrease in LE strength and endurance and decreased functional mobility below age-related norm. FOTO score shows decrease in subjective functional ability  below PLOF. Pt would benefit from skilled PT services working on improving muscle strength, balance, and gait for improving safety and mobility.    OBJECTIVE IMPAIRMENTS: decreased activity tolerance, decreased balance, decreased endurance, decreased mobility, difficulty walking, decreased strength, and pain  ACTIVITY LIMITATIONS: sitting, standing, squatting, stairs, transfers, and locomotion level  PARTICIPATION LIMITATIONS: driving, shopping, community activity, and church  PERSONAL FACTORS: Time since onset of injury/illness/exacerbation and 3+ comorbidities: HTN, CHF, Depression  are also affecting patient's functional outcome.   REHAB POTENTIAL: Excellent  CLINICAL DECISION MAKING: Stable/uncomplicated  EVALUATION COMPLEXITY: Low   GOALS: Goals reviewed with patient? No  SHORT TERM GOALS: Target date: 04/18/2023   Pt will be compliant and knowledgeable with initial HEP for improved comfort and carryover Baseline: initial HEP given Goal status: INITIAL  2.  Pt will self report bilateral knee pain no greater than 5/10 for improved comfort and functional ability Baseline: 8/10 at  worst Goal status: INITIAL   LONG TERM GOALS: Target date: 05/23/2023   Pt will improve FOTO function score to no less than 71% as proxy for functional improvement Baseline: 68% function Goal status: INITIAL   2.  Pt will self report bilateral knee pain no greater than 3/10 for improved comfort and functional ability Baseline: 8/10 at worst Goal status: INITIAL   3.  Pt will increase 30 Second Sit to Stand rep count to no less than 12 reps for improved balance, strength, and functional mobility Baseline: 10 reps  Goal status: INITIAL   4.  Pt will improve walking endurance to no less than 45 minutes for improved functional ability and community navigation Baseline: 30 minutes Goal status: INITIAL    5.  Pt will improve LE MMT to no less than 5/5 for all tested motions for improved comfort and functional mobility Baseline: see chart Goal status: INITIAL   PLAN:  PT FREQUENCY: 1x/week  PT DURATION: 8 weeks  PLANNED INTERVENTIONS: 97146- PT Re-evaluation, 97110-Therapeutic exercises, 97530- Therapeutic activity, [id number]- Neuromuscular re-education, 97535- Self Care, [id number]- Manual therapy, [id number]- Gait training, [id number]- Aquatic Therapy, 97014- Electrical stimulation (unattended), [id number]- Electrical stimulation (manual), 97016- Vasopneumatic device, Patient/Family education, Dry Needling, Cryotherapy, and Moist heat  PLAN FOR NEXT SESSION: assess HEP response, LE strengthening and balance training   Eloy End, PT 03/28/2023, 11:42 AM

## 2023-04-12 ENCOUNTER — Ambulatory Visit: Payer: 59

## 2023-04-18 ENCOUNTER — Ambulatory Visit: Payer: 59 | Admitting: Orthopaedic Surgery

## 2023-04-19 ENCOUNTER — Telehealth: Payer: Self-pay

## 2023-04-19 ENCOUNTER — Ambulatory Visit: Payer: 59 | Attending: Orthopaedic Surgery

## 2023-04-19 DIAGNOSIS — M25562 Pain in left knee: Secondary | ICD-10-CM | POA: Insufficient documentation

## 2023-04-19 DIAGNOSIS — M6281 Muscle weakness (generalized): Secondary | ICD-10-CM | POA: Insufficient documentation

## 2023-04-19 DIAGNOSIS — G8929 Other chronic pain: Secondary | ICD-10-CM | POA: Insufficient documentation

## 2023-04-19 DIAGNOSIS — R2689 Other abnormalities of gait and mobility: Secondary | ICD-10-CM | POA: Insufficient documentation

## 2023-04-19 NOTE — Telephone Encounter (Signed)
PT called but was unable to reach patient or leave voicemail. This is her first no show, will not change schedule at this time.  Eloy End   04/19/23 2:25 PM

## 2023-04-26 ENCOUNTER — Encounter: Payer: 59 | Admitting: Physical Therapy

## 2023-04-27 ENCOUNTER — Ambulatory Visit: Payer: 59

## 2023-04-27 DIAGNOSIS — R2689 Other abnormalities of gait and mobility: Secondary | ICD-10-CM | POA: Diagnosis present

## 2023-04-27 DIAGNOSIS — G8929 Other chronic pain: Secondary | ICD-10-CM

## 2023-04-27 DIAGNOSIS — M6281 Muscle weakness (generalized): Secondary | ICD-10-CM

## 2023-04-27 DIAGNOSIS — M25562 Pain in left knee: Secondary | ICD-10-CM | POA: Diagnosis present

## 2023-04-27 NOTE — Therapy (Signed)
OUTPATIENT PHYSICAL THERAPY TREATMENT   Patient Name: Jamie Hicks MRN: [id number] DOB:January 10, 1956, 67 y.o., female Today's Date: 04/27/2023  END OF SESSION:  PT End of Session - 04/27/23 1335     Visit Number 2    Number of Visits 9    Date for PT Re-Evaluation 05/23/23    Authorization Type [employer] MCR    PT Start Time 1400    PT Stop Time 1440    PT Time Calculation (min) 40 min    Activity Tolerance Patient tolerated treatment well    Behavior During Therapy WFL for tasks assessed/performed              Past Medical History:  Diagnosis Date   Allergy    Anemia    ARDS (adult respiratory distress syndrome) (HCC)    Arthritis    OA   Back pain    Blind right eye    GERD (gastroesophageal reflux disease)    Hypertension    Thyroid disease    Past Surgical History:  Procedure Laterality Date   HERNIA REPAIR     knee arthorscopy     Patient Active Problem List   Diagnosis Date Noted   Snoring 03/09/2018   Daytime sleepiness 03/09/2018   Effusion, left knee 10/13/2017   Chronic pain of left knee 09/26/2017   Unilateral primary osteoarthritis, left knee 09/26/2017   Unilateral primary osteoarthritis, right knee 09/26/2017   Chronic pain of right knee 09/26/2017   Polysubstance abuse (HCC) 05/31/2017   Acute systolic (congestive) heart failure (HCC) 04/25/2017   Acute respiratory failure with hypoxia (HCC)    ARDS (adult respiratory distress syndrome) (HCC)    Acute respiratory distress    Community acquired pneumonia of left upper lobe of lung    Hypoxemia    Influenza A 04/18/2017   Left upper lobe pneumonia 04/18/2017   Obesity, Class III, BMI 40-49.9 (morbid obesity) (HCC) 04/18/2017   Thyroid activity decreased    Cocaine dependence with cocaine-induced mood disorder (HCC) 07/22/2014   Major depressive disorder, recurrent, severe without psychotic features (HCC)    Substance induced mood disorder (HCC) 07/19/2014   Suicidal ideation     Homicidal ideation    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   High myopia 10/10/2013   Hx of retinal detachment 10/10/2013   Nuclear sclerosis of both eyes 10/10/2013   Osteoarthritis 09/24/2012   Prediabetes 09/24/2012   Dyslipidemia 09/24/2012   Vitamin D insufficiency 09/24/2012   Hypothyroidism 09/22/2012   Bilateral chronic knee pain 09/22/2012   Smoker 09/22/2012   Hypertension 09/22/2012   PCP: Fleet Contras, MD  REFERRING PROVIDER: Kathryne Hitch, MD   REFERRING DIAG:  S80.12XD (ICD-10-CM) - Contusion of left leg, subsequent encounter S80.11XD (ICD-10-CM) - Contusion of right leg, subsequent encounter M25.562,G89.29 (ICD-10-CM) - Chronic pain of left knee M25.561,G89.29 (ICD-10-CM) - Chronic pain of right knee  THERAPY DIAG:  Chronic pain of left knee  Muscle weakness (generalized)  Other abnormalities of gait and mobility  Rationale for Evaluation and Treatment: Rehabilitation  ONSET DATE: 02/06/23  SUBJECTIVE:   SUBJECTIVE STATEMENT: Pt presents to PT with no current reports of pain in knees. Has not fallen since last session. Pt has been fairly compliant with HEP.   PERTINENT HISTORY: HTN, CHF, Depression  PAIN:  Are you having pain?  No: NPRS scale: 0/10 Worst: 8/10 Pain location: bilateral knees, L>R Pain description: sharp, deep ache Aggravating factors: sleeping, cold Relieving factors: heat, medication  PRECAUTIONS: Fall  RED FLAGS: None  WEIGHT BEARING RESTRICTIONS: No  FALLS:  Has patient fallen in last 6 months? Yes. Number of falls - 6 - mostly mechanical  LIVING ENVIRONMENT: Lives with: lives alone Lives in: House/apartment Stairs: Elevator access Has following equipment at home: Single point cane  OCCUPATION: Retired  PLOF: Independent  PATIENT GOALS: Pt would like to decrease her knee pain and improve her walking distance  NEXT MD VISIT: 04/18/2023  OBJECTIVE:  Note: Objective measures were completed at Evaluation  unless otherwise noted.  DIAGNOSTIC FINDINGS:   N/A  PATIENT SURVEYS:  FOTO: 68% function; 71% predicted   COGNITION: Overall cognitive status: Within functional limits for tasks assessed   SENSATION: WFL  EDEMA:  DNT  MUSCLE LENGTH: Hamstrings: Right WFL; Left WNL Thomas test: Right (-); Left (-)   POSTURE: rounded shoulders and forward head  PALPATION: TTP to L quad  LOWER EXTREMITY MMT:  MMT Right eval Left eval  Hip flexion 5/5 4/5  Hip extension    Hip abduction 5/5 4/5  Hip adduction    Hip internal rotation    Hip external rotation    Knee flexion 5/5 4/5  Knee extension 5/5 5/5  Ankle dorsiflexion    Ankle plantarflexion    Ankle inversion    Ankle eversion     (Blank rows = not tested)  LOWER EXTREMITY SPECIAL TESTS:  DNT  FUNCTIONAL TESTS:  30 Second Sit to Stand: 10 reps  GAIT: Distance walked: 58ft Assistive device utilized: None Level of assistance: Complete Independence Comments: trunk flexed - otherwise no deviations  TREATMENT: [facility] Adult PT Treatment:                                                DATE: 04/27/2023 Therapeutic Exercise: Supine SLR 2x10 2# each Bridge 2x10 Tandem stance 2x30" Standing hip abd/ext 2x10 RTB STS high table x 10 - no UE LAQ 2x10 2# each Seated ball squeeze x 10 - 5" hold  [facility] Adult PT Treatment:                                                DATE: 03/28/2023 Therapeutic Exercise: Supine SLR x 15 each S/L hip abd x 10 each Seated clamshell x 15 blue band LAQ x 5 each  PATIENT EDUCATION:  Education details: continue HEP Person educated: Patient Education method: Explanation, Demonstration, and Handouts Education comprehension: verbalized understanding and returned demonstration  HOME EXERCISE PROGRAM: Access Code: [email or web address] URL: [email or web address] Date: 03/28/2023 Prepared by: Edwinna Areola  Exercises - Active Straight Leg Raise with Quad Set  - 1 x daily - 7 x weekly -  2-3 sets - 15 reps - Sidelying Hip Abduction  - 1 x daily - 7 x weekly - 2-3 sets - 10 reps - Seated Hip Abduction with Resistance  - 1 x daily - 7 x weekly - 3 sets - 15 reps - blue band hold - Seated Long Arc Quad  - 1 x daily - 7 x weekly - 3 sets - 10 reps - 5 sec hold  ASSESSMENT:  CLINICAL IMPRESSION: Pt was able to complete all prescribed exercises with no adverse effect. Therapy today focused on LE strengthening and static balance. HEP updated for continuing to improve  LE strength. Will continue to progress as able per POC.   EVAL: Patient is a 67 y.o. F who was seen today for physical therapy evaluation and treatment for weakness and imbalance. Physical findings are consistent with MD impression as pt demonstrates decrease in LE strength and endurance and decreased functional mobility below age-related norm. FOTO score shows decrease in subjective functional ability below PLOF. Pt would benefit from skilled PT services working on improving muscle strength, balance, and gait for improving safety and mobility.    OBJECTIVE IMPAIRMENTS: decreased activity tolerance, decreased balance, decreased endurance, decreased mobility, difficulty walking, decreased strength, and pain  ACTIVITY LIMITATIONS: sitting, standing, squatting, stairs, transfers, and locomotion level  PARTICIPATION LIMITATIONS: driving, shopping, community activity, and church  PERSONAL FACTORS: Time since onset of injury/illness/exacerbation and 3+ comorbidities: HTN, CHF, Depression  are also affecting patient's functional outcome.   REHAB POTENTIAL: Excellent  CLINICAL DECISION MAKING: Stable/uncomplicated  EVALUATION COMPLEXITY: Low   GOALS: Goals reviewed with patient? No  SHORT TERM GOALS: Target date: 04/18/2023   Pt will be compliant and knowledgeable with initial HEP for improved comfort and carryover Baseline: initial HEP given Goal status: INITIAL  2.  Pt will self report bilateral knee pain no  greater than 5/10 for improved comfort and functional ability Baseline: 8/10 at worst Goal status: INITIAL   LONG TERM GOALS: Target date: 05/23/2023   Pt will improve FOTO function score to no less than 71% as proxy for functional improvement Baseline: 68% function Goal status: INITIAL   2.  Pt will self report bilateral knee pain no greater than 3/10 for improved comfort and functional ability Baseline: 8/10 at worst Goal status: INITIAL   3.  Pt will increase 30 Second Sit to Stand rep count to no less than 12 reps for improved balance, strength, and functional mobility Baseline: 10 reps  Goal status: INITIAL   4.  Pt will improve walking endurance to no less than 45 minutes for improved functional ability and community navigation Baseline: 30 minutes Goal status: INITIAL    5.  Pt will improve LE MMT to no less than 5/5 for all tested motions for improved comfort and functional mobility Baseline: see chart Goal status: INITIAL   PLAN:  PT FREQUENCY: 1x/week  PT DURATION: 8 weeks  PLANNED INTERVENTIONS: 97146- PT Re-evaluation, 97110-Therapeutic exercises, 97530- Therapeutic activity, [id number]- Neuromuscular re-education, 97535- Self Care, [id number]- Manual therapy, [id number]- Gait training, [id number]- Aquatic Therapy, 97014- Electrical stimulation (unattended), [id number]- Electrical stimulation (manual), 97016- Vasopneumatic device, Patient/Family education, Dry Needling, Cryotherapy, and Moist heat  PLAN FOR NEXT SESSION: assess HEP response, LE strengthening and balance training   Eloy End, PT 04/27/2023, 2:46 PM

## 2023-05-03 ENCOUNTER — Ambulatory Visit: Payer: 59

## 2023-05-03 DIAGNOSIS — M6281 Muscle weakness (generalized): Secondary | ICD-10-CM

## 2023-05-03 DIAGNOSIS — G8929 Other chronic pain: Secondary | ICD-10-CM

## 2023-05-03 DIAGNOSIS — M25562 Pain in left knee: Secondary | ICD-10-CM | POA: Diagnosis not present

## 2023-05-03 NOTE — Therapy (Signed)
OUTPATIENT PHYSICAL THERAPY TREATMENT   Patient Name: Jamie Hicks MRN: [id number] DOB:[birth year], 67 y.o., female Today's Date: 05/03/2023  END OF SESSION:  PT End of Session - 05/03/23 1350     Visit Number 3    Number of Visits 9    Date for PT Re-Evaluation 05/23/23    Authorization Type [employer] MCR    PT Start Time 1400    PT Stop Time 1440    PT Time Calculation (min) 40 min    Activity Tolerance Patient tolerated treatment well    Behavior During Therapy WFL for tasks assessed/performed               Past Medical History:  Diagnosis Date   Allergy    Anemia    ARDS (adult respiratory distress syndrome) (HCC)    Arthritis    OA   Back pain    Blind right eye    GERD (gastroesophageal reflux disease)    Hypertension    Thyroid disease    Past Surgical History:  Procedure Laterality Date   HERNIA REPAIR     knee arthorscopy     Patient Active Problem List   Diagnosis Date Noted   Snoring 03/09/2018   Daytime sleepiness 03/09/2018   Effusion, left knee 10/13/2017   Chronic pain of left knee 09/26/2017   Unilateral primary osteoarthritis, left knee 09/26/2017   Unilateral primary osteoarthritis, right knee 09/26/2017   Chronic pain of right knee 09/26/2017   Polysubstance abuse (HCC) 05/31/2017   Acute systolic (congestive) heart failure (HCC) 04/25/2017   Acute respiratory failure with hypoxia (HCC)    ARDS (adult respiratory distress syndrome) (HCC)    Acute respiratory distress    Community acquired pneumonia of left upper lobe of lung    Hypoxemia    Influenza A 04/18/2017   Left upper lobe pneumonia 04/18/2017   Obesity, Class III, BMI 40-49.9 (morbid obesity) (HCC) 04/18/2017   Thyroid activity decreased    Cocaine dependence with cocaine-induced mood disorder (HCC) 07/22/2014   Major depressive disorder, recurrent, severe without psychotic features (HCC)    Substance induced mood disorder (HCC) 07/19/2014   Suicidal ideation     Homicidal ideation    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   High myopia 10/10/2013   Hx of retinal detachment 10/10/2013   Nuclear sclerosis of both eyes 10/10/2013   Osteoarthritis 09/24/2012   Prediabetes 09/24/2012   Dyslipidemia 09/24/2012   Vitamin D insufficiency 09/24/2012   Hypothyroidism 09/22/2012   Bilateral chronic knee pain 09/22/2012   Smoker 09/22/2012   Hypertension 09/22/2012   PCP: Fleet Contras, MD  REFERRING PROVIDER: Kathryne Hitch, MD   REFERRING DIAG:  S80.12XD (ICD-10-CM) - Contusion of left leg, subsequent encounter S80.11XD (ICD-10-CM) - Contusion of right leg, subsequent encounter M25.562,G89.29 (ICD-10-CM) - Chronic pain of left knee M25.561,G89.29 (ICD-10-CM) - Chronic pain of right knee  THERAPY DIAG:  Chronic pain of left knee  Muscle weakness (generalized)  Rationale for Evaluation and Treatment: Rehabilitation  ONSET DATE: 02/06/23  SUBJECTIVE:   SUBJECTIVE STATEMENT: Pt presents to PT with no current reports of pain in knees. Has not fallen since last session. Pt has been fairly compliant with HEP.  PERTINENT HISTORY: HTN, CHF, Depression  PAIN:  Are you having pain?  No: NPRS scale: 0/10 Worst: 8/10 Pain location: bilateral knees, L>R Pain description: sharp, deep ache Aggravating factors: sleeping, cold Relieving factors: heat, medication  PRECAUTIONS: Fall  RED FLAGS: None   WEIGHT BEARING RESTRICTIONS: No  FALLS:  Has patient fallen in last 6 months? Yes. Number of falls - 6 - mostly mechanical  LIVING ENVIRONMENT: Lives with: lives alone Lives in: House/apartment Stairs: Elevator access Has following equipment at home: Single point cane  OCCUPATION: Retired  PLOF: Independent  PATIENT GOALS: Pt would like to decrease her knee pain and improve her walking distance  NEXT MD VISIT: 04/18/2023  OBJECTIVE:  Note: Objective measures were completed at Evaluation unless otherwise noted.  DIAGNOSTIC FINDINGS:    N/A  PATIENT SURVEYS:  FOTO: 68% function; 71% predicted   COGNITION: Overall cognitive status: Within functional limits for tasks assessed   SENSATION: WFL  EDEMA:  DNT  MUSCLE LENGTH: Hamstrings: Right WFL; Left WNL Thomas test: Right (-); Left (-)   POSTURE: rounded shoulders and forward head  PALPATION: TTP to L quad  LOWER EXTREMITY MMT:  MMT Right eval Left eval  Hip flexion 5/5 4/5  Hip extension    Hip abduction 5/5 4/5  Hip adduction    Hip internal rotation    Hip external rotation    Knee flexion 5/5 4/5  Knee extension 5/5 5/5  Ankle dorsiflexion    Ankle plantarflexion    Ankle inversion    Ankle eversion     (Blank rows = not tested)  LOWER EXTREMITY SPECIAL TESTS:  DNT  FUNCTIONAL TESTS:  30 Second Sit to Stand: 10 reps  GAIT: Distance walked: 97ft Assistive device utilized: None Level of assistance: Complete Independence Comments: trunk flexed - otherwise no deviations  TREATMENT: [facility] Adult PT Treatment:                                                DATE: 05/03/2023 Therapeutic Exercise: NuStep lvl 5 UE/LE x 4 min while taking subjective Standing hip abd/ext 2x10 Tandem stance 2x30" LAQ 2x15 2.5# Bridge with blue band 2x10 Supine clamshell blue band 3x15  Seated pilates ring squeeze 3x10 STS high table 2x10 - no UE Supine SLR 2x15  [facility] Adult PT Treatment:                                                DATE: 04/27/2023 Therapeutic Exercise: Supine SLR 2x10 2# each Bridge 2x10 Tandem stance 2x30" Standing hip abd/ext 2x10 RTB STS high table x 10 - no UE LAQ 2x10 2# each Seated ball squeeze x 10 - 5" hold  [facility] Adult PT Treatment:                                                DATE: 03/28/2023 Therapeutic Exercise: Supine SLR x 15 each S/L hip abd x 10 each Seated clamshell x 15 blue band LAQ x 5 each  PATIENT EDUCATION:  Education details: continue HEP Person educated: Patient Education method: Explanation,  Demonstration, and Handouts Education comprehension: verbalized understanding and returned demonstration  HOME EXERCISE PROGRAM: Access Code: [email or web address] URL: [email or web address] Date: 03/28/2023 Prepared by: Edwinna Areola  Exercises - Active Straight Leg Raise with Quad Set  - 1 x daily - 7 x weekly - 2-3 sets - 15 reps - Sidelying Hip  Abduction  - 1 x daily - 7 x weekly - 2-3 sets - 10 reps - Seated Hip Abduction with Resistance  - 1 x daily - 7 x weekly - 3 sets - 15 reps - blue band hold - Seated Long Arc Quad  - 1 x daily - 7 x weekly - 3 sets - 10 reps - 5 sec hold  ASSESSMENT:  CLINICAL IMPRESSION: Pt was able to complete all prescribed exercises with no adverse effect. Therapy today focused on LE strengthening and static balance. HEP updated for continuing to improve LE strength. Will continue to progress as able per POC.   EVAL: Patient is a 67 y.o. F who was seen today for physical therapy evaluation and treatment for weakness and imbalance. Physical findings are consistent with MD impression as pt demonstrates decrease in LE strength and endurance and decreased functional mobility below age-related norm. FOTO score shows decrease in subjective functional ability below PLOF. Pt would benefit from skilled PT services working on improving muscle strength, balance, and gait for improving safety and mobility.    OBJECTIVE IMPAIRMENTS: decreased activity tolerance, decreased balance, decreased endurance, decreased mobility, difficulty walking, decreased strength, and pain  ACTIVITY LIMITATIONS: sitting, standing, squatting, stairs, transfers, and locomotion level  PARTICIPATION LIMITATIONS: driving, shopping, community activity, and church  PERSONAL FACTORS: Time since onset of injury/illness/exacerbation and 3+ comorbidities: HTN, CHF, Depression  are also affecting patient's functional outcome.   REHAB POTENTIAL: Excellent  CLINICAL DECISION MAKING:  Stable/uncomplicated  EVALUATION COMPLEXITY: Low   GOALS: Goals reviewed with patient? No  SHORT TERM GOALS: Target date: 04/18/2023   Pt will be compliant and knowledgeable with initial HEP for improved comfort and carryover Baseline: initial HEP given Goal status: INITIAL  2.  Pt will self report bilateral knee pain no greater than 5/10 for improved comfort and functional ability Baseline: 8/10 at worst Goal status: INITIAL   LONG TERM GOALS: Target date: 05/23/2023   Pt will improve FOTO function score to no less than 71% as proxy for functional improvement Baseline: 68% function Goal status: INITIAL   2.  Pt will self report bilateral knee pain no greater than 3/10 for improved comfort and functional ability Baseline: 8/10 at worst Goal status: INITIAL   3.  Pt will increase 30 Second Sit to Stand rep count to no less than 12 reps for improved balance, strength, and functional mobility Baseline: 10 reps  Goal status: INITIAL   4.  Pt will improve walking endurance to no less than 45 minutes for improved functional ability and community navigation Baseline: 30 minutes Goal status: INITIAL    5.  Pt will improve LE MMT to no less than 5/5 for all tested motions for improved comfort and functional mobility Baseline: see chart Goal status: INITIAL   PLAN:  PT FREQUENCY: 1x/week  PT DURATION: 8 weeks  PLANNED INTERVENTIONS: 97146- PT Re-evaluation, 97110-Therapeutic exercises, 97530- Therapeutic activity, [id number]- Neuromuscular re-education, 97535- Self Care, [id number]- Manual therapy, [id number]- Gait training, [id number]- Aquatic Therapy, 97014- Electrical stimulation (unattended), [id number]- Electrical stimulation (manual), 97016- Vasopneumatic device, Patient/Family education, Dry Needling, Cryotherapy, and Moist heat  PLAN FOR NEXT SESSION: assess HEP response, LE strengthening and balance training   Eloy End, PT 05/03/2023, 2:44 PM

## 2023-05-24 ENCOUNTER — Ambulatory Visit: Payer: 59 | Attending: Orthopaedic Surgery | Admitting: Physical Therapy

## 2023-05-24 ENCOUNTER — Encounter: Payer: Self-pay | Admitting: Physical Therapy

## 2023-05-24 DIAGNOSIS — M25562 Pain in left knee: Secondary | ICD-10-CM | POA: Diagnosis present

## 2023-05-24 DIAGNOSIS — M6281 Muscle weakness (generalized): Secondary | ICD-10-CM | POA: Diagnosis present

## 2023-05-24 DIAGNOSIS — G8929 Other chronic pain: Secondary | ICD-10-CM | POA: Insufficient documentation

## 2023-05-24 DIAGNOSIS — R2689 Other abnormalities of gait and mobility: Secondary | ICD-10-CM | POA: Insufficient documentation

## 2023-05-24 NOTE — Therapy (Signed)
OUTPATIENT PHYSICAL THERAPY TREATMENT   Patient Name: Jamie Hicks MRN: [id number] DOB:[birth year], 67 y.o., female Today's Date: 05/25/2023  END OF SESSION:  PT End of Session - 05/24/23 1418     Visit Number 4    Number of Visits 9    Date for PT Re-Evaluation 07/06/23    Authorization Type [employer] MCR    PT Start Time 1424    PT Stop Time 1505    PT Time Calculation (min) 41 min               Past Medical History:  Diagnosis Date   Allergy    Anemia    ARDS (adult respiratory distress syndrome) (HCC)    Arthritis    OA   Back pain    Blind right eye    GERD (gastroesophageal reflux disease)    Hypertension    Thyroid disease    Past Surgical History:  Procedure Laterality Date   HERNIA REPAIR     knee arthorscopy     Patient Active Problem List   Diagnosis Date Noted   Snoring 03/09/2018   Daytime sleepiness 03/09/2018   Effusion, left knee 10/13/2017   Chronic pain of left knee 09/26/2017   Unilateral primary osteoarthritis, left knee 09/26/2017   Unilateral primary osteoarthritis, right knee 09/26/2017   Chronic pain of right knee 09/26/2017   Polysubstance abuse (HCC) 05/31/2017   Acute systolic (congestive) heart failure (HCC) 04/25/2017   Acute respiratory failure with hypoxia (HCC)    ARDS (adult respiratory distress syndrome) (HCC)    Acute respiratory distress    Community acquired pneumonia of left upper lobe of lung    Hypoxemia    Influenza A 04/18/2017   Left upper lobe pneumonia 04/18/2017   Obesity, Class III, BMI 40-49.9 (morbid obesity) (HCC) 04/18/2017   Thyroid activity decreased    Cocaine dependence with cocaine-induced mood disorder (HCC) 07/22/2014   Major depressive disorder, recurrent, severe without psychotic features (HCC)    Substance induced mood disorder (HCC) 07/19/2014   Suicidal ideation    Homicidal ideation    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   High myopia 10/10/2013   Hx of retinal detachment 10/10/2013    Nuclear sclerosis of both eyes 10/10/2013   Osteoarthritis 09/24/2012   Prediabetes 09/24/2012   Dyslipidemia 09/24/2012   Vitamin D insufficiency 09/24/2012   Hypothyroidism 09/22/2012   Bilateral chronic knee pain 09/22/2012   Smoker 09/22/2012   Hypertension 09/22/2012   PCP: Fleet Contras, MD  REFERRING PROVIDER: Kathryne Hitch, MD   REFERRING DIAG:  S80.12XD (ICD-10-CM) - Contusion of left leg, subsequent encounter S80.11XD (ICD-10-CM) - Contusion of right leg, subsequent encounter M25.562,G89.29 (ICD-10-CM) - Chronic pain of left knee M25.561,G89.29 (ICD-10-CM) - Chronic pain of right knee  THERAPY DIAG:  Chronic pain of left knee - Plan: PT plan of care cert/re-cert  Muscle weakness (generalized) - Plan: PT plan of care cert/re-cert  Other abnormalities of gait and mobility - Plan: PT plan of care cert/re-cert  Rationale for Evaluation and Treatment: Rehabilitation  ONSET DATE: 02/06/23  SUBJECTIVE:   SUBJECTIVE STATEMENT: Doing better 2/10 knee pain left more than right. Not really doing the exercises.  No falls since last session.   PERTINENT HISTORY: HTN, CHF, Depression  PAIN:  Are you having pain?  No: NPRS scale: 0/10 Worst: 5/10 Pain location: bilateral knees, L>R Pain description: sharp, deep ache Aggravating factors: sleeping, cold Relieving factors: heat, medication  PRECAUTIONS: Fall  RED FLAGS: None  WEIGHT BEARING RESTRICTIONS: No  FALLS:  Has patient fallen in last 6 months? Yes. Number of falls - 6 - mostly mechanical  LIVING ENVIRONMENT: Lives with: lives alone Lives in: House/apartment Stairs: Elevator access Has following equipment at home: Single point cane  OCCUPATION: Retired  PLOF: Independent  PATIENT GOALS: Pt would like to decrease her knee pain and improve her walking distance  NEXT MD VISIT: 04/18/2023  OBJECTIVE:  Note: Objective measures were completed at Evaluation unless otherwise  noted.  DIAGNOSTIC FINDINGS:   N/A  PATIENT SURVEYS:  FOTO: 68% function; 71% predicted  FOTO 61% 05/24/23  COGNITION: Overall cognitive status: Within functional limits for tasks assessed   SENSATION: WFL  EDEMA:  DNT  MUSCLE LENGTH: Hamstrings: Right WFL; Left WNL Thomas test: Right (-); Left (-)   POSTURE: rounded shoulders and forward head  PALPATION: TTP to L quad  LOWER EXTREMITY MMT:  MMT Right eval Left eval Left 05/24/23   Hip flexion 5/5 4/5 4+/5  Hip extension     Hip abduction 5/5 4/5   Hip adduction     Hip internal rotation     Hip external rotation     Knee flexion 5/5 4/5 4+/5  Knee extension 5/5 5/5   Ankle dorsiflexion     Ankle plantarflexion     Ankle inversion     Ankle eversion      (Blank rows = not tested)  LOWER EXTREMITY SPECIAL TESTS:  DNT  FUNCTIONAL TESTS:  30 Second Sit to Stand: 10 reps 30 sec Sit to Stand : 9 reps 05/24/23  GAIT: Distance walked: 6ft Assistive device utilized: None Level of assistance: Complete Independence Comments: trunk flexed - otherwise no deviations  TREATMENT: [facility] Adult PT Treatment:                                                DATE: 05/24/23 Therapeutic Exercise: Nustep L 5 UE/LE x 6 minutes STS x 10 6 inch step up 10 each with light UE touch intermittently  Side stepping red band x 8 passes  Hip ext red band 2 x 10 each  Tandem stance 40 sec best  Knee ext 10# bilat x 10 Knee flex 20# bilat x 10  SLR 3 x 10 each  Blue band supine clam 2 x 15  Blue Band bridge 15 x 2     [facility] Adult PT Treatment:                                                DATE: 05/03/2023 Therapeutic Exercise: NuStep lvl 5 UE/LE x 4 min while taking subjective Standing hip abd/ext 2x10 Tandem stance 2x30" LAQ 2x15 2.5# Bridge with blue band 2x10 Supine clamshell blue band 3x15  Seated pilates ring squeeze 3x10 STS high table 2x10 - no UE Supine SLR 2x15  [facility] Adult PT Treatment:                                                 DATE: 04/27/2023 Therapeutic Exercise: Supine SLR 2x10 2# each Bridge 2x10 Tandem stance 2x30"  Standing hip abd/ext 2x10 RTB STS high table x 10 - no UE LAQ 2x10 2# each Seated ball squeeze x 10 - 5" hold  [facility] Adult PT Treatment:                                                DATE: 03/28/2023 Therapeutic Exercise: Supine SLR x 15 each S/L hip abd x 10 each Seated clamshell x 15 blue band LAQ x 5 each  PATIENT EDUCATION:  Education details: continue HEP Person educated: Patient Education method: Explanation, Demonstration, and Handouts Education comprehension: verbalized understanding and returned demonstration  HOME EXERCISE PROGRAM: Access Code: [email or web address] URL: [email or web address] Date: 03/28/2023 Prepared by: Edwinna Areola  Exercises - Active Straight Leg Raise with Quad Set  - 1 x daily - 7 x weekly - 2-3 sets - 15 reps - Sidelying Hip Abduction  - 1 x daily - 7 x weekly - 2-3 sets - 10 reps - Seated Hip Abduction with Resistance  - 1 x daily - 7 x weekly - 3 sets - 15 reps - blue band hold - Seated Long Arc Quad  - 1 x daily - 7 x weekly - 3 sets - 10 reps - 5 sec hold - Supine Bridge with Resistance Band  - 1 x daily - 7 x weekly - 1-2 sets - 15 reps ASSESSMENT:  CLINICAL IMPRESSION: Pt arrives for her 3 rd treatment since initial evaluation due to scheduling conflicts and lack of available appointments. She has made progress in her ability to shop 30 minutes at the shopping center. Her MMT for hip and knee strength has improved. Worst pain level reduced from 8/10 to 5/10, STG# 2 met. Less compliance with HEP over the last 2 weeks.  FOTO score shows decrease in subjective functional ability since Eval. Mild decrease in 30 sec STS reps indicating continued decreased functional endurance. Pt would benefit from skilled PT services working on improving muscle strength, balance, and gait for improving safety and mobility so will extend  POC for 4 more weeks.    EVAL: Patient is a 67 y.o. F who was seen today for physical therapy evaluation and treatment for weakness and imbalance. Physical findings are consistent with MD impression as pt demonstrates decrease in LE strength and endurance and decreased functional mobility below age-related norm. FOTO score shows decrease in subjective functional ability below PLOF. Pt would benefit from skilled PT services working on improving muscle strength, balance, and gait for improving safety and mobility.    OBJECTIVE IMPAIRMENTS: decreased activity tolerance, decreased balance, decreased endurance, decreased mobility, difficulty walking, decreased strength, and pain  ACTIVITY LIMITATIONS: sitting, standing, squatting, stairs, transfers, and locomotion level  PARTICIPATION LIMITATIONS: driving, shopping, community activity, and church  PERSONAL FACTORS: Time since onset of injury/illness/exacerbation and 3+ comorbidities: HTN, CHF, Depression  are also affecting patient's functional outcome.   REHAB POTENTIAL: Excellent  CLINICAL DECISION MAKING: Stable/uncomplicated  EVALUATION COMPLEXITY: Low   GOALS: Goals reviewed with patient? No  SHORT TERM GOALS: Target date: 04/18/2023   Pt will be compliant and knowledgeable with initial HEP for improved comfort and carryover Baseline: initial HEP given Goal status: ONGOING   2.  Pt will self report bilateral knee pain no greater than 5/10 for improved comfort and functional ability Baseline: 8/10 at worst 05/24/23: 5/10 at worst Goal status:  MET  LONG TERM  GOALS: Target date: 07/06/2023    Pt will improve FOTO function score to no less than 71% as proxy for functional improvement Baseline: 68% function 05/24/23: 61% Goal status: ONGOING    2.  Pt will self report bilateral knee pain no greater than 3/10 for improved comfort and functional ability Baseline: 8/10 at worst 05/24/23: 5/10  Goal status:    3.  Pt will increase  30 Second Sit to Stand rep count to no less than 12 reps for improved balance, strength, and functional mobility Baseline: 10 reps  05/24/23: 9 reps Goal status: ONGOING   4.  Pt will improve walking endurance to no less than 45 minutes for improved functional ability and community navigation Baseline: 30 minutes 05/24/23: 30 minutes  Goal status: ONGOING    5.  Pt will improve LE MMT to no less than 5/5 for all tested motions for improved comfort and functional mobility Baseline: see chart 05/24/23: 4+/5 Goal status: ONGOING   PLAN:  PT FREQUENCY: 1x/week  PT DURATION: 6 weeks  PLANNED INTERVENTIONS: 97146- PT Re-evaluation, 97110-Therapeutic exercises, 97530- Therapeutic activity, 97112- Neuromuscular re-education, 97535- Self Care, 62130- Manual therapy, [id number]- Gait training, [id number]- Aquatic Therapy, 97014- Electrical stimulation (unattended), [id number]- Electrical stimulation (manual), 97016- Vasopneumatic device, Patient/Family education, Dry Needling, Cryotherapy, and Moist heat  PLAN FOR NEXT SESSION: assess HEP response, LE strengthening and balance training   Jannette Spanner, PTA 05/25/23 3:52 PM Phone: [phone] Fax: [phone]

## 2023-05-25 NOTE — Addendum Note (Signed)
Addended by: Eloy End on: 05/25/2023 03:52 PM   Modules accepted: Orders

## 2023-05-26 ENCOUNTER — Ambulatory Visit: Payer: 59

## 2023-05-26 DIAGNOSIS — G8929 Other chronic pain: Secondary | ICD-10-CM

## 2023-05-26 DIAGNOSIS — M6281 Muscle weakness (generalized): Secondary | ICD-10-CM

## 2023-05-26 DIAGNOSIS — M25562 Pain in left knee: Secondary | ICD-10-CM | POA: Diagnosis not present

## 2023-05-26 NOTE — Therapy (Signed)
OUTPATIENT PHYSICAL THERAPY TREATMENT   Patient Name: Jamie Hicks MRN: [id number] DOB:September 20, 1955, 67 y.o., female Today's Date: 05/27/2023  END OF SESSION:  PT End of Session - 05/26/23 1448     Visit Number 5    Number of Visits 9    Date for PT Re-Evaluation 07/06/23    Authorization Type [employer] MCR    PT Start Time 1448    PT Stop Time 1527    PT Time Calculation (min) 39 min                Past Medical History:  Diagnosis Date   Allergy    Anemia    ARDS (adult respiratory distress syndrome) (HCC)    Arthritis    OA   Back pain    Blind right eye    GERD (gastroesophageal reflux disease)    Hypertension    Thyroid disease    Past Surgical History:  Procedure Laterality Date   HERNIA REPAIR     knee arthorscopy     Patient Active Problem List   Diagnosis Date Noted   Snoring 03/09/2018   Daytime sleepiness 03/09/2018   Effusion, left knee 10/13/2017   Chronic pain of left knee 09/26/2017   Unilateral primary osteoarthritis, left knee 09/26/2017   Unilateral primary osteoarthritis, right knee 09/26/2017   Chronic pain of right knee 09/26/2017   Polysubstance abuse (HCC) 05/31/2017   Acute systolic (congestive) heart failure (HCC) 04/25/2017   Acute respiratory failure with hypoxia (HCC)    ARDS (adult respiratory distress syndrome) (HCC)    Acute respiratory distress    Community acquired pneumonia of left upper lobe of lung    Hypoxemia    Influenza A 04/18/2017   Left upper lobe pneumonia 04/18/2017   Obesity, Class III, BMI 40-49.9 (morbid obesity) (HCC) 04/18/2017   Thyroid activity decreased    Cocaine dependence with cocaine-induced mood disorder (HCC) 07/22/2014   Major depressive disorder, recurrent, severe without psychotic features (HCC)    Substance induced mood disorder (HCC) 07/19/2014   Suicidal ideation    Homicidal ideation    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   High myopia 10/10/2013   Hx of retinal detachment 10/10/2013    Nuclear sclerosis of both eyes 10/10/2013   Osteoarthritis 09/24/2012   Prediabetes 09/24/2012   Dyslipidemia 09/24/2012   Vitamin D insufficiency 09/24/2012   Hypothyroidism 09/22/2012   Bilateral chronic knee pain 09/22/2012   Smoker 09/22/2012   Hypertension 09/22/2012   PCP: Fleet Contras, MD  REFERRING PROVIDER: Doneen Poisson, MD   REFERRING DIAG:  S80.12XD (ICD-10-CM) - Contusion of left leg, subsequent encounter S80.11XD (ICD-10-CM) - Contusion of right leg, subsequent encounter M25.562,G89.29 (ICD-10-CM) - Chronic pain of left knee M25.561,G89.29 (ICD-10-CM) - Chronic pain of right knee  THERAPY DIAG:  Chronic pain of left knee  Muscle weakness (generalized)  Rationale for Evaluation and Treatment: Rehabilitation  ONSET DATE: 02/06/23  SUBJECTIVE:   SUBJECTIVE STATEMENT: Pt presents to PT with reports of no current pain in bilateral knee. Pt has been compliant with HEP.   PERTINENT HISTORY: HTN, CHF, Depression  PAIN:  Are you having pain?  No: NPRS scale: 0/10 Worst: 5/10 Pain location: bilateral knees, L>R Pain description: sharp, deep ache Aggravating factors: sleeping, cold Relieving factors: heat, medication  PRECAUTIONS: Fall  RED FLAGS: None   WEIGHT BEARING RESTRICTIONS: No  FALLS:  Has patient fallen in last 6 months? Yes. Number of falls - 6 - mostly mechanical  LIVING ENVIRONMENT: Lives with: lives  alone Lives in: House/apartment Stairs: Elevator access Has following equipment at home: Single point cane  OCCUPATION: Retired  PLOF: Independent  PATIENT GOALS: Pt would like to decrease her knee pain and improve her walking distance  NEXT MD VISIT: 04/18/2023  OBJECTIVE:  Note: Objective measures were completed at Evaluation unless otherwise noted.  DIAGNOSTIC FINDINGS:   N/A  PATIENT SURVEYS:  FOTO: 68% function; 71% predicted  FOTO 61% 05/24/23  COGNITION: Overall cognitive status: Within functional limits for  tasks assessed   SENSATION: WFL  EDEMA:  DNT  MUSCLE LENGTH: Hamstrings: Right WFL; Left WNL Thomas test: Right (-); Left (-)   POSTURE: rounded shoulders and forward head  PALPATION: TTP to L quad  LOWER EXTREMITY MMT:  MMT Right eval Left eval Left 05/24/23   Hip flexion 5/5 4/5 4+/5  Hip extension     Hip abduction 5/5 4/5   Hip adduction     Hip internal rotation     Hip external rotation     Knee flexion 5/5 4/5 4+/5  Knee extension 5/5 5/5   Ankle dorsiflexion     Ankle plantarflexion     Ankle inversion     Ankle eversion      (Blank rows = not tested)  LOWER EXTREMITY SPECIAL TESTS:  DNT  FUNCTIONAL TESTS:  30 Second Sit to Stand: 10 reps 30 sec Sit to Stand : 9 reps 05/24/23  GAIT: Distance walked: 38ft Assistive device utilized: None Level of assistance: Complete Independence Comments: trunk flexed - otherwise no deviations  TREATMENT: [facility] Adult PT Treatment:                                                DATE: 05/26/23 Therapeutic Exercise: Nustep L 5 UE/LE x 5 minutes LAQ 2x10 3# Supine SLR 2x15  Supine clamshell 2x15 black band Bridge black band 2x10 Lateral walk RTB x 2 laps at counter Standing hip abd/ext 2x10 RTB Standing mini squat 2x10 - UE support Slant board calf stretch x 30" Seated knee flexion 2x10 25# Seated knee ext 2x10 10# Seated hamstring stretch x 30" each  [facility] Adult PT Treatment:                                                DATE: 05/24/23 Therapeutic Exercise: Nustep L 5 UE/LE x 6 minutes STS x 10 6 inch step up 10 each with light UE touch intermittently  Side stepping red band x 8 passes  Hip ext red band 2 x 10 each  Tandem stance 40 sec best  Knee ext 10# bilat x 10 Knee flex 20# bilat x 10  SLR 3 x 10 each  Blue band supine clam 2 x 15  Blue Band bridge 15 x 2     [facility] Adult PT Treatment:                                                DATE: 05/03/2023 Therapeutic Exercise: NuStep lvl 5 UE/LE x 4  min while taking subjective Standing hip abd/ext 2x10 Tandem stance 2x30" LAQ 2x15 2.5#  Bridge with blue band 2x10 Supine clamshell blue band 3x15  Seated pilates ring squeeze 3x10 STS high table 2x10 - no UE Supine SLR 2x15  [facility] Adult PT Treatment:                                                DATE: 04/27/2023 Therapeutic Exercise: Supine SLR 2x10 2# each Bridge 2x10 Tandem stance 2x30" Standing hip abd/ext 2x10 RTB STS high table x 10 - no UE LAQ 2x10 2# each Seated ball squeeze x 10 - 5" hold  [facility] Adult PT Treatment:                                                DATE: 03/28/2023 Therapeutic Exercise: Supine SLR x 15 each S/L hip abd x 10 each Seated clamshell x 15 blue band LAQ x 5 each  PATIENT EDUCATION:  Education details: continue HEP Person educated: Patient Education method: Explanation, Demonstration, and Handouts Education comprehension: verbalized understanding and returned demonstration  HOME EXERCISE PROGRAM: Access Code: [email or web address] URL: [email or web address] Date: 03/28/2023 Prepared by: Edwinna Areola  Exercises - Active Straight Leg Raise with Quad Set  - 1 x daily - 7 x weekly - 2-3 sets - 15 reps - Sidelying Hip Abduction  - 1 x daily - 7 x weekly - 2-3 sets - 10 reps - Seated Hip Abduction with Resistance  - 1 x daily - 7 x weekly - 3 sets - 15 reps - blue band hold - Seated Long Arc Quad  - 1 x daily - 7 x weekly - 3 sets - 10 reps - 5 sec hold - Supine Bridge with Resistance Band  - 1 x daily - 7 x weekly - 1-2 sets - 15 reps ASSESSMENT:  CLINICAL IMPRESSION: Pt was able to complete prescribed exercises with no adverse effect. Therapy today focused on LE strengthening in order to decrease knee pain and improve her mobility. She continues to benefit from skilled PT services and she continues to demonstrate decrease in LE strength and endurance. Will continue to progress as able per POC.    EVAL: Patient is a 67 y.o. F who was seen  today for physical therapy evaluation and treatment for weakness and imbalance. Physical findings are consistent with MD impression as pt demonstrates decrease in LE strength and endurance and decreased functional mobility below age-related norm. FOTO score shows decrease in subjective functional ability below PLOF. Pt would benefit from skilled PT services working on improving muscle strength, balance, and gait for improving safety and mobility.    OBJECTIVE IMPAIRMENTS: decreased activity tolerance, decreased balance, decreased endurance, decreased mobility, difficulty walking, decreased strength, and pain  ACTIVITY LIMITATIONS: sitting, standing, squatting, stairs, transfers, and locomotion level  PARTICIPATION LIMITATIONS: driving, shopping, community activity, and church  PERSONAL FACTORS: Time since onset of injury/illness/exacerbation and 3+ comorbidities: HTN, CHF, Depression  are also affecting patient's functional outcome.   REHAB POTENTIAL: Excellent  CLINICAL DECISION MAKING: Stable/uncomplicated  EVALUATION COMPLEXITY: Low   GOALS: Goals reviewed with patient? No  SHORT TERM GOALS: Target date: 04/18/2023   Pt will be compliant and knowledgeable with initial HEP for improved comfort and carryover Baseline: initial HEP given Goal status: ONGOING   2.  Pt will self report bilateral knee pain no greater than 5/10 for improved comfort and functional ability Baseline: 8/10 at worst 05/24/23: 5/10 at worst Goal status:  MET  LONG TERM GOALS: Target date: 07/06/2023    Pt will improve FOTO function score to no less than 71% as proxy for functional improvement Baseline: 68% function 05/24/23: 61% Goal status: ONGOING    2.  Pt will self report bilateral knee pain no greater than 3/10 for improved comfort and functional ability Baseline: 8/10 at worst 05/24/23: 5/10  Goal status:    3.  Pt will increase 30 Second Sit to Stand rep count to no less than 12 reps for improved  balance, strength, and functional mobility Baseline: 10 reps  05/24/23: 9 reps Goal status: ONGOING   4.  Pt will improve walking endurance to no less than 45 minutes for improved functional ability and community navigation Baseline: 30 minutes 05/24/23: 30 minutes  Goal status: ONGOING    5.  Pt will improve LE MMT to no less than 5/5 for all tested motions for improved comfort and functional mobility Baseline: see chart 05/24/23: 4+/5 Goal status: ONGOING   PLAN:  PT FREQUENCY: 1x/week  PT DURATION: 6 weeks  PLANNED INTERVENTIONS: 97146- PT Re-evaluation, 97110-Therapeutic exercises, 97530- Therapeutic activity, 97112- Neuromuscular re-education, 97535- Self Care, 16109- Manual therapy, [id number]- Gait training, [id number]- Aquatic Therapy, 97014- Electrical stimulation (unattended), [id number]- Electrical stimulation (manual), 97016- Vasopneumatic device, Patient/Family education, Dry Needling, Cryotherapy, and Moist heat  PLAN FOR NEXT SESSION: assess HEP response, LE strengthening and balance training   Eloy End PT  05/27/23 7:53 AM

## 2023-05-31 ENCOUNTER — Ambulatory Visit: Payer: 59

## 2023-05-31 DIAGNOSIS — M6281 Muscle weakness (generalized): Secondary | ICD-10-CM

## 2023-05-31 DIAGNOSIS — G8929 Other chronic pain: Secondary | ICD-10-CM

## 2023-05-31 DIAGNOSIS — R2689 Other abnormalities of gait and mobility: Secondary | ICD-10-CM

## 2023-05-31 DIAGNOSIS — M25562 Pain in left knee: Secondary | ICD-10-CM | POA: Diagnosis not present

## 2023-05-31 NOTE — Therapy (Signed)
OUTPATIENT PHYSICAL THERAPY TREATMENT   Patient Name: Jamie Hicks MRN: [id number] DOB:03-28-1956, 67 y.o., female Today's Date: 05/31/2023  END OF SESSION:  PT End of Session - 05/31/23 1352     Visit Number 6    Number of Visits 9    Date for PT Re-Evaluation 07/06/23    Authorization Type [employer] MCR    PT Start Time 1400    PT Stop Time 1430    PT Time Calculation (min) 30 min                 Past Medical History:  Diagnosis Date   Allergy    Anemia    ARDS (adult respiratory distress syndrome) (HCC)    Arthritis    OA   Back pain    Blind right eye    GERD (gastroesophageal reflux disease)    Hypertension    Thyroid disease    Past Surgical History:  Procedure Laterality Date   HERNIA REPAIR     knee arthorscopy     Patient Active Problem List   Diagnosis Date Noted   Snoring 03/09/2018   Daytime sleepiness 03/09/2018   Effusion, left knee 10/13/2017   Chronic pain of left knee 09/26/2017   Unilateral primary osteoarthritis, left knee 09/26/2017   Unilateral primary osteoarthritis, right knee 09/26/2017   Chronic pain of right knee 09/26/2017   Polysubstance abuse (HCC) 05/31/2017   Acute systolic (congestive) heart failure (HCC) 04/25/2017   Acute respiratory failure with hypoxia (HCC)    ARDS (adult respiratory distress syndrome) (HCC)    Acute respiratory distress    Community acquired pneumonia of left upper lobe of lung    Hypoxemia    Influenza A 04/18/2017   Left upper lobe pneumonia 04/18/2017   Obesity, Class III, BMI 40-49.9 (morbid obesity) (HCC) 04/18/2017   Thyroid activity decreased    Cocaine dependence with cocaine-induced mood disorder (HCC) 07/22/2014   Major depressive disorder, recurrent, severe without psychotic features (HCC)    Substance induced mood disorder (HCC) 07/19/2014   Suicidal ideation    Homicidal ideation    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   High myopia 10/10/2013   Hx of retinal detachment 10/10/2013    Nuclear sclerosis of both eyes 10/10/2013   Osteoarthritis 09/24/2012   Prediabetes 09/24/2012   Dyslipidemia 09/24/2012   Vitamin D insufficiency 09/24/2012   Hypothyroidism 09/22/2012   Bilateral chronic knee pain 09/22/2012   Smoker 09/22/2012   Hypertension 09/22/2012   PCP: Fleet Contras, MD  REFERRING PROVIDER: Doneen Poisson, MD   REFERRING DIAG:  S80.12XD (ICD-10-CM) - Contusion of left leg, subsequent encounter S80.11XD (ICD-10-CM) - Contusion of right leg, subsequent encounter M25.562,G89.29 (ICD-10-CM) - Chronic pain of left knee M25.561,G89.29 (ICD-10-CM) - Chronic pain of right knee  THERAPY DIAG:  Chronic pain of left knee  Muscle weakness (generalized)  Other abnormalities of gait and mobility  Rationale for Evaluation and Treatment: Rehabilitation  ONSET DATE: 02/06/23  SUBJECTIVE:   SUBJECTIVE STATEMENT: Pt presents to PT with reports of no current pain in bilateral knee but does have stiffness in both knees. She asks if she can leave by 2:30 in order to catch the bus. Pt has been compliant with HEP.   PERTINENT HISTORY: HTN, CHF, Depression  PAIN:  Are you having pain?  No: NPRS scale: 0/10 Worst: 5/10 Pain location: bilateral knees, L>R Pain description: sharp, deep ache Aggravating factors: sleeping, cold Relieving factors: heat, medication  PRECAUTIONS: Fall  RED FLAGS: None  WEIGHT BEARING RESTRICTIONS: No  FALLS:  Has patient fallen in last 6 months? Yes. Number of falls - 6 - mostly mechanical  LIVING ENVIRONMENT: Lives with: lives alone Lives in: House/apartment Stairs: Elevator access Has following equipment at home: Single point cane  OCCUPATION: Retired  PLOF: Independent  PATIENT GOALS: Pt would like to decrease her knee pain and improve her walking distance  NEXT MD VISIT: 04/18/2023  OBJECTIVE:  Note: Objective measures were completed at Evaluation unless otherwise noted.  DIAGNOSTIC FINDINGS:    N/A  PATIENT SURVEYS:  FOTO: 68% function; 71% predicted  FOTO 61% 05/24/23  COGNITION: Overall cognitive status: Within functional limits for tasks assessed   SENSATION: WFL  EDEMA:  DNT  MUSCLE LENGTH: Hamstrings: Right WFL; Left WNL Thomas test: Right (-); Left (-)   POSTURE: rounded shoulders and forward head  PALPATION: TTP to L quad  LOWER EXTREMITY MMT:  MMT Right eval Left eval Left 05/24/23   Hip flexion 5/5 4/5 4+/5  Hip extension     Hip abduction 5/5 4/5   Hip adduction     Hip internal rotation     Hip external rotation     Knee flexion 5/5 4/5 4+/5  Knee extension 5/5 5/5   Ankle dorsiflexion     Ankle plantarflexion     Ankle inversion     Ankle eversion      (Blank rows = not tested)  LOWER EXTREMITY SPECIAL TESTS:  DNT  FUNCTIONAL TESTS:  30 Second Sit to Stand: 10 reps 30 sec Sit to Stand : 9 reps 05/24/23  GAIT: Distance walked: 35ft Assistive device utilized: None Level of assistance: Complete Independence Comments: trunk flexed - otherwise no deviations  TREATMENT: [facility] Adult PT Treatment:                                                DATE: 05/31/23 Therapeutic Exercise: Nustep L 5 UE/LE x 3 minutes Lateral walk RTB x 2 laps RTB LAQ 2x10 2.5# Supine SLR 2x10 Seated hamstring curl 2x10 15# McConnell tape with medical glide R  [facility] Adult PT Treatment:                                                DATE: 05/26/23 Therapeutic Exercise: Nustep L 5 UE/LE x 5 minutes LAQ 2x10 3# Supine SLR 2x15  Supine clamshell 2x15 black band Bridge black band 2x10 Lateral walk RTB x 2 laps at counter Standing hip abd/ext 2x10 RTB Standing mini squat 2x10 - UE support Slant board calf stretch x 30" Seated knee flexion 2x10 25# Seated knee ext 2x10 10# Seated hamstring stretch x 30" each  [facility] Adult PT Treatment:                                                DATE: 05/24/23 Therapeutic Exercise: Nustep L 5 UE/LE x 6 minutes STS x  10 6 inch step up 10 each with light UE touch intermittently  Side stepping red band x 8 passes  Hip ext red band 2 x 10 each  Tandem stance  40 sec best  Knee ext 10# bilat x 10 Knee flex 20# bilat x 10  SLR 3 x 10 each  Blue band supine clam 2 x 15  Blue Band bridge 15 x 2    PATIENT EDUCATION:  Education details: continue HEP Person educated: Patient Education method: Explanation, Demonstration, and Handouts Education comprehension: verbalized understanding and returned demonstration  HOME EXERCISE PROGRAM: Access Code: [email or web address] URL: [email or web address] Date: 03/28/2023 Prepared by: Edwinna Areola  Exercises - Active Straight Leg Raise with Quad Set  - 1 x daily - 7 x weekly - 2-3 sets - 15 reps - Sidelying Hip Abduction  - 1 x daily - 7 x weekly - 2-3 sets - 10 reps - Seated Hip Abduction with Resistance  - 1 x daily - 7 x weekly - 3 sets - 15 reps - blue band hold - Seated Long Arc Quad  - 1 x daily - 7 x weekly - 3 sets - 10 reps - 5 sec hold - Supine Bridge with Resistance Band  - 1 x daily - 7 x weekly - 1-2 sets - 15 reps ASSESSMENT:  CLINICAL IMPRESSION: Pt was able to complete all prescribed exercises with no adverse effect. Therapy again focused on LE strengthening for decreasing knee pain and improving mobility. Will continue per POC.   EVAL: Patient is a 67 y.o. F who was seen today for physical therapy evaluation and treatment for weakness and imbalance. Physical findings are consistent with MD impression as pt demonstrates decrease in LE strength and endurance and decreased functional mobility below age-related norm. FOTO score shows decrease in subjective functional ability below PLOF. Pt would benefit from skilled PT services working on improving muscle strength, balance, and gait for improving safety and mobility.    OBJECTIVE IMPAIRMENTS: decreased activity tolerance, decreased balance, decreased endurance, decreased mobility, difficulty walking,  decreased strength, and pain  ACTIVITY LIMITATIONS: sitting, standing, squatting, stairs, transfers, and locomotion level  PARTICIPATION LIMITATIONS: driving, shopping, community activity, and church  PERSONAL FACTORS: Time since onset of injury/illness/exacerbation and 3+ comorbidities: HTN, CHF, Depression  are also affecting patient's functional outcome.   REHAB POTENTIAL: Excellent  CLINICAL DECISION MAKING: Stable/uncomplicated  EVALUATION COMPLEXITY: Low   GOALS: Goals reviewed with patient? No  SHORT TERM GOALS: Target date: 04/18/2023   Pt will be compliant and knowledgeable with initial HEP for improved comfort and carryover Baseline: initial HEP given Goal status: ONGOING   2.  Pt will self report bilateral knee pain no greater than 5/10 for improved comfort and functional ability Baseline: 8/10 at worst 05/24/23: 5/10 at worst Goal status:  MET  LONG TERM GOALS: Target date: 07/06/2023    Pt will improve FOTO function score to no less than 71% as proxy for functional improvement Baseline: 68% function 05/24/23: 61% Goal status: ONGOING    2.  Pt will self report bilateral knee pain no greater than 3/10 for improved comfort and functional ability Baseline: 8/10 at worst 05/24/23: 5/10  Goal status:    3.  Pt will increase 30 Second Sit to Stand rep count to no less than 12 reps for improved balance, strength, and functional mobility Baseline: 10 reps  05/24/23: 9 reps Goal status: ONGOING   4.  Pt will improve walking endurance to no less than 45 minutes for improved functional ability and community navigation Baseline: 30 minutes 05/24/23: 30 minutes  Goal status: ONGOING    5.  Pt will improve LE MMT to no less than 5/5 for all  tested motions for improved comfort and functional mobility Baseline: see chart 05/24/23: 4+/5 Goal status: ONGOING   PLAN:  PT FREQUENCY: 1x/week  PT DURATION: 6 weeks  PLANNED INTERVENTIONS: 97146- PT Re-evaluation,  97110-Therapeutic exercises, 97530- Therapeutic activity, 97112- Neuromuscular re-education, 97535- Self Care, [id number]- Manual therapy, [id number]- Gait training, [id number]- Aquatic Therapy, 97014- Electrical stimulation (unattended), [id number]- Electrical stimulation (manual), 97016- Vasopneumatic device, Patient/Family education, Dry Needling, Cryotherapy, and Moist heat  PLAN FOR NEXT SESSION: assess HEP response, LE strengthening and balance training   Eloy End PT  05/31/23 2:44 PM

## 2023-06-01 ENCOUNTER — Other Ambulatory Visit: Payer: Self-pay | Admitting: Internal Medicine

## 2023-06-02 ENCOUNTER — Ambulatory Visit: Payer: 59

## 2023-06-02 NOTE — Therapy (Deleted)
OUTPATIENT PHYSICAL THERAPY TREATMENT   Patient Name: Jamie Hicks MRN: [id number] DOB:[birth year], 67 y.o., female Today's Date: 06/02/2023  END OF SESSION:        Past Medical History:  Diagnosis Date   Allergy    Anemia    ARDS (adult respiratory distress syndrome) (HCC)    Arthritis    OA   Back pain    Blind right eye    GERD (gastroesophageal reflux disease)    Hypertension    Thyroid disease    Past Surgical History:  Procedure Laterality Date   HERNIA REPAIR     knee arthorscopy     Patient Active Problem List   Diagnosis Date Noted   Snoring 03/09/2018   Daytime sleepiness 03/09/2018   Effusion, left knee 10/13/2017   Chronic pain of left knee 09/26/2017   Unilateral primary osteoarthritis, left knee 09/26/2017   Unilateral primary osteoarthritis, right knee 09/26/2017   Chronic pain of right knee 09/26/2017   Polysubstance abuse (HCC) 05/31/2017   Acute systolic (congestive) heart failure (HCC) 04/25/2017   Acute respiratory failure with hypoxia (HCC)    ARDS (adult respiratory distress syndrome) (HCC)    Acute respiratory distress    Community acquired pneumonia of left upper lobe of lung    Hypoxemia    Influenza A 04/18/2017   Left upper lobe pneumonia 04/18/2017   Obesity, Class III, BMI 40-49.9 (morbid obesity) (HCC) 04/18/2017   Thyroid activity decreased    Cocaine dependence with cocaine-induced mood disorder (HCC) 07/22/2014   Major depressive disorder, recurrent, severe without psychotic features (HCC)    Substance induced mood disorder (HCC) 07/19/2014   Suicidal ideation    Homicidal ideation    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   High myopia 10/10/2013   Hx of retinal detachment 10/10/2013   Nuclear sclerosis of both eyes 10/10/2013   Osteoarthritis 09/24/2012   Prediabetes 09/24/2012   Dyslipidemia 09/24/2012   Vitamin D insufficiency 09/24/2012   Hypothyroidism 09/22/2012   Bilateral chronic knee pain 09/22/2012   Smoker  09/22/2012   Hypertension 09/22/2012   PCP: Fleet Contras, MD  REFERRING PROVIDER: Doneen Poisson, MD   REFERRING DIAG:  S80.12XD (ICD-10-CM) - Contusion of left leg, subsequent encounter S80.11XD (ICD-10-CM) - Contusion of right leg, subsequent encounter M25.562,G89.29 (ICD-10-CM) - Chronic pain of left knee M25.561,G89.29 (ICD-10-CM) - Chronic pain of right knee  THERAPY DIAG:  No diagnosis found.  Rationale for Evaluation and Treatment: Rehabilitation  ONSET DATE: 02/06/23  SUBJECTIVE:   SUBJECTIVE STATEMENT: Pt presents to PT with reports of no current pain in bilateral knee but does have stiffness in both knees. She asks if she can leave by 2:30 in order to catch the bus. Pt has been compliant with HEP.   PERTINENT HISTORY: HTN, CHF, Depression  PAIN:  Are you having pain?  No: NPRS scale: 0/10 Worst: 5/10 Pain location: bilateral knees, L>R Pain description: sharp, deep ache Aggravating factors: sleeping, cold Relieving factors: heat, medication  PRECAUTIONS: Fall  RED FLAGS: None   WEIGHT BEARING RESTRICTIONS: No  FALLS:  Has patient fallen in last 6 months? Yes. Number of falls - 6 - mostly mechanical  LIVING ENVIRONMENT: Lives with: lives alone Lives in: House/apartment Stairs: Elevator access Has following equipment at home: Single point cane  OCCUPATION: Retired  PLOF: Independent  PATIENT GOALS: Pt would like to decrease her knee pain and improve her walking distance  NEXT MD VISIT: 04/18/2023  OBJECTIVE:  Note: Objective measures were completed at Evaluation unless  otherwise noted.  DIAGNOSTIC FINDINGS:   N/A  PATIENT SURVEYS:  FOTO: 68% function; 71% predicted  FOTO 61% 05/24/23  COGNITION: Overall cognitive status: Within functional limits for tasks assessed   SENSATION: WFL  EDEMA:  DNT  MUSCLE LENGTH: Hamstrings: Right WFL; Left WNL Thomas test: Right (-); Left (-)   POSTURE: rounded shoulders and forward  head  PALPATION: TTP to L quad  LOWER EXTREMITY MMT:  MMT Right eval Left eval Left 05/24/23   Hip flexion 5/5 4/5 4+/5  Hip extension     Hip abduction 5/5 4/5   Hip adduction     Hip internal rotation     Hip external rotation     Knee flexion 5/5 4/5 4+/5  Knee extension 5/5 5/5   Ankle dorsiflexion     Ankle plantarflexion     Ankle inversion     Ankle eversion      (Blank rows = not tested)  LOWER EXTREMITY SPECIAL TESTS:  DNT  FUNCTIONAL TESTS:  30 Second Sit to Stand: 10 reps 30 sec Sit to Stand : 9 reps 05/24/23  GAIT: Distance walked: 73ft Assistive device utilized: None Level of assistance: Complete Independence Comments: trunk flexed - otherwise no deviations  TREATMENT: [facility] Adult PT Treatment:                                                DATE: 05/31/23 Therapeutic Exercise: Nustep L 5 UE/LE x 3 minutes Lateral walk RTB x 2 laps RTB LAQ 2x10 2.5# Supine SLR 2x10 Seated hamstring curl 2x10 15# McConnell tape with medical glide R  [facility] Adult PT Treatment:                                                DATE: 05/26/23 Therapeutic Exercise: Nustep L 5 UE/LE x 5 minutes LAQ 2x10 3# Supine SLR 2x15  Supine clamshell 2x15 black band Bridge black band 2x10 Lateral walk RTB x 2 laps at counter Standing hip abd/ext 2x10 RTB Standing mini squat 2x10 - UE support Slant board calf stretch x 30" Seated knee flexion 2x10 25# Seated knee ext 2x10 10# Seated hamstring stretch x 30" each  [facility] Adult PT Treatment:                                                DATE: 05/24/23 Therapeutic Exercise: Nustep L 5 UE/LE x 6 minutes STS x 10 6 inch step up 10 each with light UE touch intermittently  Side stepping red band x 8 passes  Hip ext red band 2 x 10 each  Tandem stance 40 sec best  Knee ext 10# bilat x 10 Knee flex 20# bilat x 10  SLR 3 x 10 each  Blue band supine clam 2 x 15  Blue Band bridge 15 x 2    PATIENT EDUCATION:  Education details:  continue HEP Person educated: Patient Education method: Explanation, Demonstration, and Handouts Education comprehension: verbalized understanding and returned demonstration  HOME EXERCISE PROGRAM: Access Code: [email or web address] URL: [email or web address] Date: 03/28/2023 Prepared by: Edwinna Areola  Exercises - Active  Straight Leg Raise with Quad Set  - 1 x daily - 7 x weekly - 2-3 sets - 15 reps - Sidelying Hip Abduction  - 1 x daily - 7 x weekly - 2-3 sets - 10 reps - Seated Hip Abduction with Resistance  - 1 x daily - 7 x weekly - 3 sets - 15 reps - blue band hold - Seated Long Arc Quad  - 1 x daily - 7 x weekly - 3 sets - 10 reps - 5 sec hold - Supine Bridge with Resistance Band  - 1 x daily - 7 x weekly - 1-2 sets - 15 reps ASSESSMENT:  CLINICAL IMPRESSION: Pt was able to complete all prescribed exercises with no adverse effect. Therapy again focused on LE strengthening for decreasing knee pain and improving mobility. Will continue per POC.   EVAL: Patient is a 67 y.o. F who was seen today for physical therapy evaluation and treatment for weakness and imbalance. Physical findings are consistent with MD impression as pt demonstrates decrease in LE strength and endurance and decreased functional mobility below age-related norm. FOTO score shows decrease in subjective functional ability below PLOF. Pt would benefit from skilled PT services working on improving muscle strength, balance, and gait for improving safety and mobility.    OBJECTIVE IMPAIRMENTS: decreased activity tolerance, decreased balance, decreased endurance, decreased mobility, difficulty walking, decreased strength, and pain  ACTIVITY LIMITATIONS: sitting, standing, squatting, stairs, transfers, and locomotion level  PARTICIPATION LIMITATIONS: driving, shopping, community activity, and church  PERSONAL FACTORS: Time since onset of injury/illness/exacerbation and 3+ comorbidities: HTN, CHF, Depression  are also  affecting patient's functional outcome.   REHAB POTENTIAL: Excellent  CLINICAL DECISION MAKING: Stable/uncomplicated  EVALUATION COMPLEXITY: Low   GOALS: Goals reviewed with patient? No  SHORT TERM GOALS: Target date: 04/18/2023   Pt will be compliant and knowledgeable with initial HEP for improved comfort and carryover Baseline: initial HEP given Goal status: ONGOING   2.  Pt will self report bilateral knee pain no greater than 5/10 for improved comfort and functional ability Baseline: 8/10 at worst 05/24/23: 5/10 at worst Goal status:  MET  LONG TERM GOALS: Target date: 07/06/2023    Pt will improve FOTO function score to no less than 71% as proxy for functional improvement Baseline: 68% function 05/24/23: 61% Goal status: ONGOING    2.  Pt will self report bilateral knee pain no greater than 3/10 for improved comfort and functional ability Baseline: 8/10 at worst 05/24/23: 5/10  Goal status:    3.  Pt will increase 30 Second Sit to Stand rep count to no less than 12 reps for improved balance, strength, and functional mobility Baseline: 10 reps  05/24/23: 9 reps Goal status: ONGOING   4.  Pt will improve walking endurance to no less than 45 minutes for improved functional ability and community navigation Baseline: 30 minutes 05/24/23: 30 minutes  Goal status: ONGOING    5.  Pt will improve LE MMT to no less than 5/5 for all tested motions for improved comfort and functional mobility Baseline: see chart 05/24/23: 4+/5 Goal status: ONGOING   PLAN:  PT FREQUENCY: 1x/week  PT DURATION: 6 weeks  PLANNED INTERVENTIONS: 97146- PT Re-evaluation, 97110-Therapeutic exercises, 97530- Therapeutic activity, 97112- Neuromuscular re-education, 97535- Self Care, 16109- Manual therapy, [id number]- Gait training, [id number]- Aquatic Therapy, 97014- Electrical stimulation (unattended), [id number]- Electrical stimulation (manual), 97016- Vasopneumatic device, Patient/Family education, Dry  Needling, Cryotherapy, and Moist heat  PLAN FOR NEXT SESSION: assess HEP response,  LE strengthening and balance training   Eloy End PT  06/02/23 1:03 PM

## 2023-06-06 ENCOUNTER — Ambulatory Visit: Payer: 59

## 2023-06-09 ENCOUNTER — Ambulatory Visit: Payer: 59

## 2023-06-14 ENCOUNTER — Encounter: Payer: Self-pay | Admitting: Physical Therapy

## 2023-06-14 ENCOUNTER — Ambulatory Visit: Payer: 59 | Admitting: Physical Therapy

## 2023-06-14 DIAGNOSIS — M25562 Pain in left knee: Secondary | ICD-10-CM | POA: Diagnosis not present

## 2023-06-14 DIAGNOSIS — G8929 Other chronic pain: Secondary | ICD-10-CM

## 2023-06-14 DIAGNOSIS — M6281 Muscle weakness (generalized): Secondary | ICD-10-CM

## 2023-06-14 NOTE — Therapy (Addendum)
 OUTPATIENT PHYSICAL THERAPY TREATMENT NOTE/DISCHARGE  PHYSICAL THERAPY DISCHARGE SUMMARY  Visits from Start of Care: 7  Current functional level related to goals / functional outcomes: See goals/objective   Remaining deficits: Unable to assess   Education / Equipment: HEP   Patient agrees to discharge. Patient goals were unable to assess. Patient is being discharged due to not returning since the last visit.     Patient Name: Jamie Hicks MRN: [id number] DOB:1955/12/17, 67 y.o., female Today's Date: 06/14/2023  END OF SESSION:  PT End of Session - 06/14/23 1434     Visit Number 7    Number of Visits 9    Date for PT Re-Evaluation 07/06/23    Authorization Type [employer] MCR    PT Start Time 1400    PT Stop Time 1433    PT Time Calculation (min) 33 min                  Past Medical History:  Diagnosis Date   Allergy    Anemia    ARDS (adult respiratory distress syndrome) (HCC)    Arthritis    OA   Back pain    Blind right eye    GERD (gastroesophageal reflux disease)    Hypertension    Thyroid  disease    Past Surgical History:  Procedure Laterality Date   HERNIA REPAIR     knee arthorscopy     Patient Active Problem List   Diagnosis Date Noted   Snoring 03/09/2018   Daytime sleepiness 03/09/2018   Effusion, left knee 10/13/2017   Chronic pain of left knee 09/26/2017   Unilateral primary osteoarthritis, left knee 09/26/2017   Unilateral primary osteoarthritis, right knee 09/26/2017   Chronic pain of right knee 09/26/2017   Polysubstance abuse (HCC) 05/31/2017   Acute systolic (congestive) heart failure (HCC) 04/25/2017   Acute respiratory failure with hypoxia (HCC)    ARDS (adult respiratory distress syndrome) (HCC)    Acute respiratory distress    Community acquired pneumonia of left upper lobe of lung    Hypoxemia    Influenza A 04/18/2017   Left upper lobe pneumonia 04/18/2017   Obesity, Class III, BMI 40-49.9 (morbid obesity) (HCC)  04/18/2017   Thyroid  activity decreased    Cocaine  dependence with cocaine -induced mood disorder (HCC) 07/22/2014   Major depressive disorder, recurrent, severe without psychotic features (HCC)    Substance induced mood disorder (HCC) 07/19/2014   Suicidal ideation    Homicidal ideation    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   High myopia 10/10/2013   Hx of retinal detachment 10/10/2013   Nuclear sclerosis of both eyes 10/10/2013   Osteoarthritis 09/24/2012   Prediabetes 09/24/2012   Dyslipidemia 09/24/2012   Vitamin D  insufficiency 09/24/2012   Hypothyroidism 09/22/2012   Bilateral chronic knee pain 09/22/2012   Smoker 09/22/2012   Hypertension 09/22/2012   PCP: Shelda Atlas, MD  REFERRING PROVIDER: Vernetta Bruckner, MD   REFERRING DIAG:  S80.12XD (ICD-10-CM) - Contusion of left leg, subsequent encounter S80.11XD (ICD-10-CM) - Contusion of right leg, subsequent encounter M25.562,G89.29 (ICD-10-CM) - Chronic pain of left knee M25.561,G89.29 (ICD-10-CM) - Chronic pain of right knee  THERAPY DIAG:  Chronic pain of left knee  Muscle weakness (generalized)  Rationale for Evaluation and Treatment: Rehabilitation  ONSET DATE: 02/06/23  SUBJECTIVE:   SUBJECTIVE STATEMENT: Pt presents to PT with reports of right knee pain at 5/10. Reports non compliance with HEP.   PERTINENT HISTORY: HTN, CHF, Depression  PAIN:  Are you having  pain?  No: NPRS scale: 0/10 Worst: 5/10 Rt Pain location: bilateral knees, R> L Pain description: sharp, deep ache Aggravating factors: sleeping, cold Relieving factors: heat, medication  PRECAUTIONS: Fall  RED FLAGS: None   WEIGHT BEARING RESTRICTIONS: No  FALLS:  Has patient fallen in last 6 months? Yes. Number of falls - 6 - mostly mechanical  LIVING ENVIRONMENT: Lives with: lives alone Lives in: House/apartment Stairs: Elevator access Has following equipment at home: Single point cane  OCCUPATION: Retired  PLOF:  Independent  PATIENT GOALS: Pt would like to decrease her knee pain and improve her walking distance  NEXT MD VISIT: 04/18/2023  OBJECTIVE:  Note: Objective measures were completed at Evaluation unless otherwise noted.  DIAGNOSTIC FINDINGS:   N/A  PATIENT SURVEYS:  FOTO: 68% function; 71% predicted  FOTO 61% 05/24/23  COGNITION: Overall cognitive status: Within functional limits for tasks assessed   SENSATION: WFL  EDEMA:  DNT  MUSCLE LENGTH: Hamstrings: Right WFL; Left WNL Thomas test: Right (-); Left (-)   POSTURE: rounded shoulders and forward head  PALPATION: TTP to L quad  LOWER EXTREMITY MMT:  MMT Right eval Left eval Left 05/24/23   Hip flexion 5/5 4/5 4+/5  Hip extension     Hip abduction 5/5 4/5   Hip adduction     Hip internal rotation     Hip external rotation     Knee flexion 5/5 4/5 4+/5  Knee extension 5/5 5/5   Ankle dorsiflexion     Ankle plantarflexion     Ankle inversion     Ankle eversion      (Blank rows = not tested)  LOWER EXTREMITY SPECIAL TESTS:  DNT  FUNCTIONAL TESTS:  30 Second Sit to Stand: 10 reps 30 sec Sit to Stand : 9 reps 05/24/23  GAIT: Distance walked: 54ft Assistive device utilized: None Level of assistance: Complete Independence Comments: trunk flexed - otherwise no deviations  TREATMENT: [facility] Adult PT Treatment:                                                DATE: 06/14/23 Therapeutic Exercise: Nustep L5 UE/LE x 5 minutes   QS 5 sec x 10 each SAQ Bilateral  SLR 10 x 2 each  Supine black band clam Banded bridge with black band  STS x 10 Seated knee flexion 2x10 25# Seated knee ext 2x10 10# Hamstring stretch seated bilat   [facility] Adult PT Treatment:                                                DATE: 05/31/23 Therapeutic Exercise: Nustep L 5 UE/LE x 3 minutes Lateral walk RTB x 2 laps RTB LAQ 2x10 2.5# Supine SLR 2x10 Seated hamstring curl 2x10 15# McConnell tape with medical glide R  [facility]  Adult PT Treatment:                                                DATE: 05/26/23 Therapeutic Exercise: Nustep L 5 UE/LE x 5 minutes LAQ 2x10 3# Supine SLR 2x15  Supine clamshell 2x15 black  band Bridge black band 2x10 Lateral walk RTB x 2 laps at counter Standing hip abd/ext 2x10 RTB Standing mini squat 2x10 - UE support Slant board calf stretch x 30 Seated knee flexion 2x10 25# Seated knee ext 2x10 10# Seated hamstring stretch x 30 each  [facility] Adult PT Treatment:                                                DATE: 05/24/23 Therapeutic Exercise: Nustep L 5 UE/LE x 6 minutes STS x 10 6 inch step up 10 each with light UE touch intermittently  Side stepping red band x 8 passes  Hip ext red band 2 x 10 each  Tandem stance 40 sec best  Knee ext 10# bilat x 10 Knee flex 20# bilat x 10  SLR 3 x 10 each  Blue band supine clam 2 x 15  Blue Band bridge 15 x 2    PATIENT EDUCATION:  Education details: continue HEP Person educated: Patient Education method: Explanation, Demonstration, and Handouts Education comprehension: verbalized understanding and returned demonstration  HOME EXERCISE PROGRAM: Access Code: [email or web address] URL: [email or web address] Date: 03/28/2023 Prepared by: Alm Kingdom  Exercises - Active Straight Leg Raise with Quad Set  - 1 x daily - 7 x weekly - 2-3 sets - 15 reps - Sidelying Hip Abduction  - 1 x daily - 7 x weekly - 2-3 sets - 10 reps - Seated Hip Abduction with Resistance  - 1 x daily - 7 x weekly - 3 sets - 15 reps - blue band hold - Seated Long Arc Quad  - 1 x daily - 7 x weekly - 3 sets - 10 reps - 5 sec hold - Supine Bridge with Resistance Band  - 1 x daily - 7 x weekly - 1-2 sets - 15 reps ASSESSMENT:  CLINICAL IMPRESSION: Pt was able to complete all prescribed exercises with no adverse effect. Pt reports tape was helpful last session. Therapy again focused on LE strengthening for decreasing knee pain and improving mobility. Pt  requested to leave early to catch the bus. Will continue per POC. Pt has one more visit and may be appropriate for DC.   EVAL: Patient is a 67 y.o. F who was seen today for physical therapy evaluation and treatment for weakness and imbalance. Physical findings are consistent with MD impression as pt demonstrates decrease in LE strength and endurance and decreased functional mobility below age-related norm. FOTO score shows decrease in subjective functional ability below PLOF. Pt would benefit from skilled PT services working on improving muscle strength, balance, and gait for improving safety and mobility.    OBJECTIVE IMPAIRMENTS: decreased activity tolerance, decreased balance, decreased endurance, decreased mobility, difficulty walking, decreased strength, and pain  ACTIVITY LIMITATIONS: sitting, standing, squatting, stairs, transfers, and locomotion level  PARTICIPATION LIMITATIONS: driving, shopping, community activity, and church  PERSONAL FACTORS: Time since onset of injury/illness/exacerbation and 3+ comorbidities: HTN, CHF, Depression  are also affecting patient's functional outcome.   REHAB POTENTIAL: Excellent  CLINICAL DECISION MAKING: Stable/uncomplicated  EVALUATION COMPLEXITY: Low   GOALS: Goals reviewed with patient? No  SHORT TERM GOALS: Target date: 04/18/2023   Pt will be compliant and knowledgeable with initial HEP for improved comfort and carryover Baseline: initial HEP given 06/14/23: min compliance  Goal status: ONGOING   2.  Pt will self report bilateral knee  pain no greater than 5/10 for improved comfort and functional ability Baseline: 8/10 at worst 05/24/23: 5/10 at worst Goal status:  MET  LONG TERM GOALS: Target date: 07/06/2023    Pt will improve FOTO function score to no less than 71% as proxy for functional improvement Baseline: 68% function 05/24/23: 61% Goal status: ONGOING    2.  Pt will self report bilateral knee pain no greater than 3/10 for  improved comfort and functional ability Baseline: 8/10 at worst 05/24/23: 5/10  Goal status:  ONGOING  3.  Pt will increase 30 Second Sit to Stand rep count to no less than 12 reps for improved balance, strength, and functional mobility Baseline: 10 reps  05/24/23: 9 reps Goal status: ONGOING   4.  Pt will improve walking endurance to no less than 45 minutes for improved functional ability and community navigation Baseline: 30 minutes 05/24/23: 30 minutes  Goal status: ONGOING    5.  Pt will improve LE MMT to no less than 5/5 for all tested motions for improved comfort and functional mobility Baseline: see chart 05/24/23: 4+/5 Goal status: ONGOING   PLAN:  PT FREQUENCY: 1x/week  PT DURATION: 6 weeks  PLANNED INTERVENTIONS: 97146- PT Re-evaluation, 97110-Therapeutic exercises, 97530- Therapeutic activity, 97112- Neuromuscular re-education, 97535- Self Care, [id number]- Manual therapy, [id number]- Gait training, [id number]- Aquatic Therapy, 97014- Electrical stimulation (unattended), [id number]- Electrical stimulation (manual), 97016- Vasopneumatic device, Patient/Family education, Dry Needling, Cryotherapy, and Moist heat  PLAN FOR NEXT SESSION: assess HEP response, LE strengthening and balance training   Harlene Persons, PTA 06/14/23 2:35 PM Phone: [phone] Fax: [phone]

## 2023-06-15 ENCOUNTER — Other Ambulatory Visit: Payer: Self-pay | Admitting: Internal Medicine

## 2023-06-16 ENCOUNTER — Ambulatory Visit: Payer: 59

## 2023-06-16 NOTE — Telephone Encounter (Signed)
Please contact pt for future appointment. Pt overdue for 12 month f/u. 

## 2023-06-16 NOTE — Therapy (Deleted)
 OUTPATIENT PHYSICAL THERAPY TREATMENT   Patient Name: Jamie Hicks MRN: [id number] DOB:Apr 15, 1956, 68 y.o., female Today's Date: 06/16/2023  END OF SESSION:         Past Medical History:  Diagnosis Date   Allergy    Anemia    ARDS (adult respiratory distress syndrome) (HCC)    Arthritis    OA   Back pain    Blind right eye    GERD (gastroesophageal reflux disease)    Hypertension    Thyroid  disease    Past Surgical History:  Procedure Laterality Date   HERNIA REPAIR     knee arthorscopy     Patient Active Problem List   Diagnosis Date Noted   Snoring 03/09/2018   Daytime sleepiness 03/09/2018   Effusion, left knee 10/13/2017   Chronic pain of left knee 09/26/2017   Unilateral primary osteoarthritis, left knee 09/26/2017   Unilateral primary osteoarthritis, right knee 09/26/2017   Chronic pain of right knee 09/26/2017   Polysubstance abuse (HCC) 05/31/2017   Acute systolic (congestive) heart failure (HCC) 04/25/2017   Acute respiratory failure with hypoxia (HCC)    ARDS (adult respiratory distress syndrome) (HCC)    Acute respiratory distress    Community acquired pneumonia of left upper lobe of lung    Hypoxemia    Influenza A 04/18/2017   Left upper lobe pneumonia 04/18/2017   Obesity, Class III, BMI 40-49.9 (morbid obesity) (HCC) 04/18/2017   Thyroid  activity decreased    Cocaine  dependence with cocaine -induced mood disorder (HCC) 07/22/2014   Major depressive disorder, recurrent, severe without psychotic features (HCC)    Substance induced mood disorder (HCC) 07/19/2014   Suicidal ideation    Homicidal ideation    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   High myopia 10/10/2013   Hx of retinal detachment 10/10/2013   Nuclear sclerosis of both eyes 10/10/2013   Osteoarthritis 09/24/2012   Prediabetes 09/24/2012   Dyslipidemia 09/24/2012   Vitamin D  insufficiency 09/24/2012   Hypothyroidism 09/22/2012   Bilateral chronic knee pain 09/22/2012   Smoker  09/22/2012   Hypertension 09/22/2012   PCP: Shelda Atlas, MD  REFERRING PROVIDER: Vernetta Bruckner, MD   REFERRING DIAG:  S80.12XD (ICD-10-CM) - Contusion of left leg, subsequent encounter S80.11XD (ICD-10-CM) - Contusion of right leg, subsequent encounter M25.562,G89.29 (ICD-10-CM) - Chronic pain of left knee M25.561,G89.29 (ICD-10-CM) - Chronic pain of right knee  THERAPY DIAG:  No diagnosis found.  Rationale for Evaluation and Treatment: Rehabilitation  ONSET DATE: 02/06/23  SUBJECTIVE:   SUBJECTIVE STATEMENT: Pt presents to PT with reports of right knee pain at 5/10. Reports non compliance with HEP.   PERTINENT HISTORY: HTN, CHF, Depression  PAIN:  Are you having pain?  No: NPRS scale: 0/10 Worst: 5/10 Rt Pain location: bilateral knees, R> L Pain description: sharp, deep ache Aggravating factors: sleeping, cold Relieving factors: heat, medication  PRECAUTIONS: Fall  RED FLAGS: None   WEIGHT BEARING RESTRICTIONS: No  FALLS:  Has patient fallen in last 6 months? Yes. Number of falls - 6 - mostly mechanical  LIVING ENVIRONMENT: Lives with: lives alone Lives in: House/apartment Stairs: Elevator access Has following equipment at home: Single point cane  OCCUPATION: Retired  PLOF: Independent  PATIENT GOALS: Pt would like to decrease her knee pain and improve her walking distance  NEXT MD VISIT: 04/18/2023  OBJECTIVE:  Note: Objective measures were completed at Evaluation unless otherwise noted.  DIAGNOSTIC FINDINGS:   N/A  PATIENT SURVEYS:  FOTO: 68% function; 71% predicted  FOTO 61%  05/24/23  COGNITION: Overall cognitive status: Within functional limits for tasks assessed   SENSATION: WFL  EDEMA:  DNT  MUSCLE LENGTH: Hamstrings: Right WFL; Left WNL Thomas test: Right (-); Left (-)   POSTURE: rounded shoulders and forward head  PALPATION: TTP to L quad  LOWER EXTREMITY MMT:  MMT Right eval Left eval Left 05/24/23    Hip flexion 5/5 4/5 4+/5  Hip extension     Hip abduction 5/5 4/5   Hip adduction     Hip internal rotation     Hip external rotation     Knee flexion 5/5 4/5 4+/5  Knee extension 5/5 5/5   Ankle dorsiflexion     Ankle plantarflexion     Ankle inversion     Ankle eversion      (Blank rows = not tested)  LOWER EXTREMITY SPECIAL TESTS:  DNT  FUNCTIONAL TESTS:  30 Second Sit to Stand: 10 reps 30 sec Sit to Stand : 9 reps 05/24/23  GAIT: Distance walked: 74ft Assistive device utilized: None Level of assistance: Complete Independence Comments: trunk flexed - otherwise no deviations  TREATMENT: [facility] Adult PT Treatment:                                                DATE: 06/14/23 Therapeutic Exercise: Nustep L5 UE/LE x 5 minutes   QS 5 sec x 10 each SAQ Bilateral  SLR 10 x 2 each  Supine black band clam Banded bridge with black band  STS x 10 Seated knee flexion 2x10 25# Seated knee ext 2x10 10# Hamstring stretch seated bilat   [facility] Adult PT Treatment:                                                DATE: 05/31/23 Therapeutic Exercise: Nustep L 5 UE/LE x 3 minutes Lateral walk RTB x 2 laps RTB LAQ 2x10 2.5# Supine SLR 2x10 Seated hamstring curl 2x10 15# McConnell tape with medical glide R  [facility] Adult PT Treatment:                                                DATE: 05/26/23 Therapeutic Exercise: Nustep L 5 UE/LE x 5 minutes LAQ 2x10 3# Supine SLR 2x15  Supine clamshell 2x15 black band Bridge black band 2x10 Lateral walk RTB x 2 laps at counter Standing hip abd/ext 2x10 RTB Standing mini squat 2x10 - UE support Slant board calf stretch x 30 Seated knee flexion 2x10 25# Seated knee ext 2x10 10# Seated hamstring stretch x 30 each  [facility] Adult PT Treatment:                                                DATE: 05/24/23 Therapeutic Exercise: Nustep L 5 UE/LE x 6 minutes STS x 10 6 inch step up 10 each with light UE touch intermittently  Side stepping red  band x 8 passes  Hip ext red band 2 x  10 each  Tandem stance 40 sec best  Knee ext 10# bilat x 10 Knee flex 20# bilat x 10  SLR 3 x 10 each  Blue band supine clam 2 x 15  Blue Band bridge 15 x 2    PATIENT EDUCATION:  Education details: continue HEP Person educated: Patient Education method: Explanation, Demonstration, and Handouts Education comprehension: verbalized understanding and returned demonstration  HOME EXERCISE PROGRAM: Access Code: [email or web address] URL: [email or web address] Date: 03/28/2023 Prepared by: Alm Kingdom  Exercises - Active Straight Leg Raise with Quad Set  - 1 x daily - 7 x weekly - 2-3 sets - 15 reps - Sidelying Hip Abduction  - 1 x daily - 7 x weekly - 2-3 sets - 10 reps - Seated Hip Abduction with Resistance  - 1 x daily - 7 x weekly - 3 sets - 15 reps - blue band hold - Seated Long Arc Quad  - 1 x daily - 7 x weekly - 3 sets - 10 reps - 5 sec hold - Supine Bridge with Resistance Band  - 1 x daily - 7 x weekly - 1-2 sets - 15 reps ASSESSMENT:  CLINICAL IMPRESSION: ***  EVAL: Patient is a 68 y.o. F who was seen today for physical therapy evaluation and treatment for weakness and imbalance. Physical findings are consistent with MD impression as pt demonstrates decrease in LE strength and endurance and decreased functional mobility below age-related norm. FOTO score shows decrease in subjective functional ability below PLOF. Pt would benefit from skilled PT services working on improving muscle strength, balance, and gait for improving safety and mobility.    OBJECTIVE IMPAIRMENTS: decreased activity tolerance, decreased balance, decreased endurance, decreased mobility, difficulty walking, decreased strength, and pain  ACTIVITY LIMITATIONS: sitting, standing, squatting, stairs, transfers, and locomotion level  PARTICIPATION LIMITATIONS: driving, shopping, community activity, and church  PERSONAL FACTORS: Time since onset of  injury/illness/exacerbation and 3+ comorbidities: HTN, CHF, Depression  are also affecting patient's functional outcome.   REHAB POTENTIAL: Excellent  CLINICAL DECISION MAKING: Stable/uncomplicated  EVALUATION COMPLEXITY: Low   GOALS: Goals reviewed with patient? No  SHORT TERM GOALS: Target date: 04/18/2023   Pt will be compliant and knowledgeable with initial HEP for improved comfort and carryover Baseline: initial HEP given 06/14/23: min compliance  Goal status: ONGOING   2.  Pt will self report bilateral knee pain no greater than 5/10 for improved comfort and functional ability Baseline: 8/10 at worst 05/24/23: 5/10 at worst Goal status:  MET  LONG TERM GOALS: Target date: 07/06/2023    Pt will improve FOTO function score to no less than 71% as proxy for functional improvement Baseline: 68% function 05/24/23: 61% Goal status: ONGOING    2.  Pt will self report bilateral knee pain no greater than 3/10 for improved comfort and functional ability Baseline: 8/10 at worst 05/24/23: 5/10  Goal status:  ONGOING  3.  Pt will increase 30 Second Sit to Stand rep count to no less than 12 reps for improved balance, strength, and functional mobility Baseline: 10 reps  05/24/23: 9 reps Goal status: ONGOING   4.  Pt will improve walking endurance to no less than 45 minutes for improved functional ability and community navigation Baseline: 30 minutes 05/24/23: 30 minutes  Goal status: ONGOING    5.  Pt will improve LE MMT to no less than 5/5 for all tested motions for improved comfort and functional mobility Baseline: see chart 05/24/23: 4+/5 Goal status: ONGOING   PLAN:  PT FREQUENCY: 1x/week  PT DURATION: 6 weeks  PLANNED INTERVENTIONS: 97146- PT Re-evaluation, 97110-Therapeutic exercises, 97530- Therapeutic activity, [id number]- Neuromuscular re-education, 97535- Self Care, [id number]- Manual therapy, [id number]- Gait training, [id number]- Aquatic Therapy, 97014- Electrical stimulation  (unattended), [id number]- Electrical stimulation (manual), 97016- Vasopneumatic device, Patient/Family education, Dry Needling, Cryotherapy, and Moist heat  PLAN FOR NEXT SESSION: assess HEP response, LE strengthening and balance training   Alm JAYSON Kingdom PT  06/16/23 8:32 AM

## 2023-06-21 ENCOUNTER — Ambulatory Visit: Payer: 59

## 2023-06-29 ENCOUNTER — Ambulatory Visit: Payer: 59 | Attending: Orthopaedic Surgery

## 2023-07-29 ENCOUNTER — Other Ambulatory Visit: Payer: Self-pay | Admitting: Nurse Practitioner

## 2023-07-29 ENCOUNTER — Other Ambulatory Visit: Payer: Self-pay | Admitting: Internal Medicine

## 2023-08-18 ENCOUNTER — Other Ambulatory Visit: Payer: Self-pay | Admitting: Internal Medicine

## 2023-08-27 ENCOUNTER — Other Ambulatory Visit: Payer: Self-pay | Admitting: Nurse Practitioner

## 2023-09-17 ENCOUNTER — Other Ambulatory Visit: Payer: Self-pay | Admitting: Internal Medicine

## 2023-10-04 ENCOUNTER — Ambulatory Visit (INDEPENDENT_AMBULATORY_CARE_PROVIDER_SITE_OTHER): Admitting: Podiatry

## 2023-10-04 DIAGNOSIS — B351 Tinea unguium: Secondary | ICD-10-CM | POA: Diagnosis not present

## 2023-10-04 DIAGNOSIS — M79674 Pain in right toe(s): Secondary | ICD-10-CM

## 2023-10-04 DIAGNOSIS — M79675 Pain in left toe(s): Secondary | ICD-10-CM

## 2023-10-04 NOTE — Progress Notes (Signed)
  Subjective:  Patient ID: Jamie Hicks, female    DOB: 1955/12/19,   MRN: [id number]  No chief complaint on file.   68 y.o. female presents for concern of thickened elongated and painful nails that are difficult to trim. Requesting to have them trimmed today. Unable to trim nails herself.   PCP:  Charle Congo, MD    . Denies any other pedal complaints. Denies n/v/f/c.   Past Medical History:  Diagnosis Date   Allergy    Anemia    ARDS (adult respiratory distress syndrome) (HCC)    Arthritis    OA   Back pain    Blind right eye    GERD (gastroesophageal reflux disease)    Hypertension    Thyroid  disease     Objective:  Physical Exam: Vascular: DP/PT pulses 2/4 bilateral. CFT <3 seconds. Normal hair growth on digits. No edema.  Skin. No lacerations or abrasions bilateral feet. Nails 1-5 bilateral are thickened elongated and dystrophic.  Musculoskeletal: MMT 5/5 bilateral lower extremities in DF, PF, Inversion and Eversion. Deceased ROM in DF of ankle joint.  Neurological: Sensation intact to light touch.   Assessment:   1. Pain due to onychomycosis of toenails of both feet      Plan:  Patient was evaluated and treated and all questions answered. -ABN signed.  -Mechanically debrided all nails 1-5 bilateral using sterile nail nipper and filed with dremel without incident  -Answered all patient questions -Patient to return  in 3 months for at risk foot care -Patient advised to call the office if any problems or questions arise in the meantime.   Jennefer Moats, DPM

## 2024-01-10 ENCOUNTER — Encounter: Admitting: Podiatry

## 2024-01-10 NOTE — Progress Notes (Signed)
 Patient did not show for her scheduled appointment this morning.

## 2024-04-09 ENCOUNTER — Ambulatory Visit (INDEPENDENT_AMBULATORY_CARE_PROVIDER_SITE_OTHER): Admitting: Podiatry

## 2024-04-09 ENCOUNTER — Encounter: Payer: Self-pay | Admitting: Podiatry

## 2024-04-09 DIAGNOSIS — M79675 Pain in left toe(s): Secondary | ICD-10-CM

## 2024-04-09 DIAGNOSIS — B351 Tinea unguium: Secondary | ICD-10-CM | POA: Diagnosis not present

## 2024-04-09 DIAGNOSIS — M79674 Pain in right toe(s): Secondary | ICD-10-CM

## 2024-04-09 MED ORDER — CICLOPIROX 8 % EX SOLN: Freq: Every day | CUTANEOUS | 0 refills | Status: AC

## 2024-04-09 NOTE — Progress Notes (Signed)
  Subjective:  Patient ID: Jamie Hicks, female    DOB: 28-Nov-1955,   MRN: [id number]  Chief Complaint  Patient presents with   Nail Problem    I need my toenails clipped.  I have some ingrown toenails.    68 y.o. female presents for concern of thickened elongated and painful nails that are difficult to trim. Requesting to have them trimmed today. Unable to trim nails herself. Wondering about treatment options for fungus.   PCP:  Shelda Atlas, MD    . Denies any other pedal complaints. Denies n/v/f/c.   Past Medical History:  Diagnosis Date   Allergy    Anemia    ARDS (adult respiratory distress syndrome) (HCC)    Arthritis    OA   Back pain    Blind right eye    GERD (gastroesophageal reflux disease)    Hypertension    Thyroid  disease     Objective:  Physical Exam: Vascular: DP/PT pulses 2/4 bilateral. CFT <3 seconds. Normal hair growth on digits. No edema.  Skin. No lacerations or abrasions bilateral feet. Nails 1-5 bilateral are thickened elongated and dystrophic.  Musculoskeletal: MMT 5/5 bilateral lower extremities in DF, PF, Inversion and Eversion. Deceased ROM in DF of ankle joint.  Neurological: Sensation intact to light touch.   Assessment:   1. Pain due to onychomycosis of toenails of both feet      Plan:  Patient was evaluated and treated and all questions answered. -ABN signed.  -Mechanically debrided all nails 1-5 bilateral using sterile nail nipper and filed with dremel without incident  -Answered all patient questions -Examined patient -Discussed treatment options for painful dystrophic nails  -Discussed fungal nail treatment options including oral, topical, and laser treatments.  - Penlac sent to the pharmacy.  Instructions on use discussed  -Patient to return  in 3 months for at risk foot care -Patient advised to call the office if any problems or questions arise in the meantime.   Asberry Failing, DPM

## 2024-05-03 ENCOUNTER — Other Ambulatory Visit (HOSPITAL_BASED_OUTPATIENT_CLINIC_OR_DEPARTMENT_OTHER): Payer: Self-pay

## 2024-05-03 DIAGNOSIS — M8580 Other specified disorders of bone density and structure, unspecified site: Secondary | ICD-10-CM

## 2024-07-10 ENCOUNTER — Ambulatory Visit: Admitting: Podiatry

## 2024-07-10 ENCOUNTER — Encounter: Payer: Self-pay | Admitting: Podiatry

## 2024-07-10 DIAGNOSIS — M79675 Pain in left toe(s): Secondary | ICD-10-CM | POA: Diagnosis not present

## 2024-07-10 DIAGNOSIS — B351 Tinea unguium: Secondary | ICD-10-CM

## 2024-07-10 DIAGNOSIS — M79674 Pain in right toe(s): Secondary | ICD-10-CM

## 2024-07-10 NOTE — Progress Notes (Signed)
"  °  Subjective:  Patient ID: Jamie Hicks, female    DOB: [birth year],   MRN: [id number]  Chief Complaint  Patient presents with   Nail Problem    Toenail clip    69 y.o. female presents for concern of thickened elongated and painful nails that are difficult to trim. Requesting to have them trimmed today. Unable to trim nails herself. She relates she has not really been using the penlac  and has not gotten into a routine. Does relate she would like to keep trying.   PCP:  Shelda Atlas, MD    . Denies any other pedal complaints. Denies n/v/f/c.   Past Medical History:  Diagnosis Date   Allergy    Anemia    ARDS (adult respiratory distress syndrome) (HCC)    Arthritis    OA   Back pain    Blind right eye    GERD (gastroesophageal reflux disease)    Hypertension    Thyroid  disease     Objective:  Physical Exam: Vascular: DP/PT pulses 2/4 bilateral. CFT <3 seconds. Normal hair growth on digits. No edema.  Skin. No lacerations or abrasions bilateral feet. Nails 1-5 bilateral are thickened elongated and dystrophic. Possible some improvement noted to proximal nail plate.  Musculoskeletal: MMT 5/5 bilateral lower extremities in DF, PF, Inversion and Eversion. Deceased ROM in DF of ankle joint.  Neurological: Sensation intact to light touch.   Assessment:   1. Pain due to onychomycosis of toenails of both feet      Plan:  Patient was evaluated and treated and all questions answered. -ABN signed 10/04/23 -Mechanically debrided all nails 1-5 bilateral using sterile nail nipper and filed with dremel without incident  -Answered all patient questions -Examined patient -Discussed treatment options for painful dystrophic nails  -Discussed fungal nail treatment options including oral, topical, and laser treatments.  -Continue with penlac .  -Patient to return  in 3 months for at risk foot care and fungal nail check.  -Patient advised to call the office if any problems or  questions arise in the meantime.   Asberry Failing, DPM    "

## 2024-10-09 ENCOUNTER — Ambulatory Visit: Admitting: Podiatry
